# Patient Record
Sex: Male | Born: 1940 | Race: Black or African American | Hispanic: No | Marital: Married | Smoking: Former smoker
Health system: Southern US, Community
[De-identification: ages and names within clinical notes are randomized; demographics above are authoritative.]

## PROBLEM LIST (undated history)

## (undated) DIAGNOSIS — E119 Type 2 diabetes mellitus without complications: Secondary | ICD-10-CM

## (undated) DIAGNOSIS — E785 Hyperlipidemia, unspecified: Secondary | ICD-10-CM

## (undated) DIAGNOSIS — K449 Diaphragmatic hernia without obstruction or gangrene: Secondary | ICD-10-CM

## (undated) DIAGNOSIS — I2510 Atherosclerotic heart disease of native coronary artery without angina pectoris: Secondary | ICD-10-CM

## (undated) DIAGNOSIS — B20 Human immunodeficiency virus [HIV] disease: Secondary | ICD-10-CM

## (undated) DIAGNOSIS — I10 Essential (primary) hypertension: Secondary | ICD-10-CM

## (undated) DIAGNOSIS — K219 Gastro-esophageal reflux disease without esophagitis: Secondary | ICD-10-CM

## (undated) DIAGNOSIS — Z21 Asymptomatic human immunodeficiency virus [HIV] infection status: Secondary | ICD-10-CM

## (undated) DIAGNOSIS — Z960 Presence of urogenital implants: Secondary | ICD-10-CM

## (undated) DIAGNOSIS — B029 Zoster without complications: Secondary | ICD-10-CM

## (undated) DIAGNOSIS — N400 Enlarged prostate without lower urinary tract symptoms: Secondary | ICD-10-CM

## (undated) DIAGNOSIS — T884XXA Failed or difficult intubation, initial encounter: Secondary | ICD-10-CM

## (undated) HISTORY — DX: Diaphragmatic hernia without obstruction or gangrene: K44.9

## (undated) HISTORY — DX: Benign prostatic hyperplasia without lower urinary tract symptoms: N40.0

## (undated) HISTORY — DX: Atherosclerotic heart disease of native coronary artery without angina pectoris: I25.10

## (undated) HISTORY — DX: Hyperlipidemia, unspecified: E78.5

---

## 1998-04-29 ENCOUNTER — Ambulatory Visit (HOSPITAL_COMMUNITY): Admission: RE | Admit: 1998-04-29 | Discharge: 1998-04-29 | Payer: Self-pay | Admitting: Cardiology

## 1998-05-02 ENCOUNTER — Ambulatory Visit (HOSPITAL_COMMUNITY): Admission: RE | Admit: 1998-05-02 | Discharge: 1998-05-02 | Payer: Self-pay | Admitting: Cardiology

## 1998-05-02 HISTORY — PX: CARDIAC CATHETERIZATION: SHX172

## 1998-05-06 ENCOUNTER — Inpatient Hospital Stay (HOSPITAL_COMMUNITY)
Admission: RE | Admit: 1998-05-06 | Discharge: 1998-05-10 | Payer: Self-pay | Admitting: Thoracic Surgery (Cardiothoracic Vascular Surgery)

## 1998-05-06 HISTORY — PX: CORONARY ARTERY BYPASS GRAFT: SHX141

## 1998-06-17 ENCOUNTER — Encounter (HOSPITAL_COMMUNITY): Admission: RE | Admit: 1998-06-17 | Discharge: 1998-09-15 | Payer: Self-pay | Admitting: Cardiology

## 2002-10-18 HISTORY — PX: COLONOSCOPY: SHX174

## 2003-02-11 ENCOUNTER — Ambulatory Visit (HOSPITAL_COMMUNITY): Admission: RE | Admit: 2003-02-11 | Discharge: 2003-02-11 | Payer: Self-pay | Admitting: Gastroenterology

## 2006-10-04 ENCOUNTER — Encounter: Admission: RE | Admit: 2006-10-04 | Discharge: 2006-10-04 | Payer: Self-pay | Admitting: Gastroenterology

## 2007-12-06 ENCOUNTER — Ambulatory Visit: Payer: Self-pay | Admitting: Family Medicine

## 2008-01-12 ENCOUNTER — Ambulatory Visit: Payer: Self-pay | Admitting: Family Medicine

## 2009-01-30 ENCOUNTER — Ambulatory Visit: Payer: Self-pay | Admitting: Family Medicine

## 2009-02-10 ENCOUNTER — Ambulatory Visit: Payer: Self-pay | Admitting: Family Medicine

## 2009-02-17 ENCOUNTER — Encounter: Admission: RE | Admit: 2009-02-17 | Discharge: 2009-02-17 | Payer: Self-pay | Admitting: Family Medicine

## 2009-11-12 ENCOUNTER — Ambulatory Visit: Payer: Self-pay | Admitting: Family Medicine

## 2009-11-18 ENCOUNTER — Ambulatory Visit: Payer: Self-pay | Admitting: Family Medicine

## 2010-02-09 ENCOUNTER — Ambulatory Visit: Payer: Self-pay | Admitting: Family Medicine

## 2010-04-23 ENCOUNTER — Emergency Department (HOSPITAL_COMMUNITY): Admission: EM | Admit: 2010-04-23 | Discharge: 2010-04-23 | Payer: Self-pay | Admitting: Emergency Medicine

## 2010-08-11 ENCOUNTER — Ambulatory Visit: Payer: Self-pay | Admitting: Family Medicine

## 2010-08-18 ENCOUNTER — Ambulatory Visit: Payer: Self-pay | Admitting: Family Medicine

## 2010-09-21 ENCOUNTER — Ambulatory Visit: Payer: Self-pay | Admitting: Family Medicine

## 2011-01-29 ENCOUNTER — Emergency Department (HOSPITAL_COMMUNITY): Payer: MEDICARE

## 2011-01-29 ENCOUNTER — Emergency Department (HOSPITAL_COMMUNITY)
Admission: EM | Admit: 2011-01-29 | Discharge: 2011-01-29 | Disposition: A | Discharge status: Home / Self Care | Payer: MEDICARE | Attending: Emergency Medicine | Admitting: Emergency Medicine

## 2011-01-29 DIAGNOSIS — Z951 Presence of aortocoronary bypass graft: Secondary | ICD-10-CM | POA: Insufficient documentation

## 2011-01-29 DIAGNOSIS — Z79899 Other long term (current) drug therapy: Secondary | ICD-10-CM | POA: Insufficient documentation

## 2011-01-29 DIAGNOSIS — S5000XA Contusion of unspecified elbow, initial encounter: Secondary | ICD-10-CM | POA: Insufficient documentation

## 2011-01-29 DIAGNOSIS — S0003XA Contusion of scalp, initial encounter: Secondary | ICD-10-CM | POA: Insufficient documentation

## 2011-01-29 DIAGNOSIS — S0083XA Contusion of other part of head, initial encounter: Secondary | ICD-10-CM | POA: Insufficient documentation

## 2011-01-29 DIAGNOSIS — I2510 Atherosclerotic heart disease of native coronary artery without angina pectoris: Secondary | ICD-10-CM | POA: Insufficient documentation

## 2011-01-29 DIAGNOSIS — S0990XA Unspecified injury of head, initial encounter: Secondary | ICD-10-CM | POA: Insufficient documentation

## 2011-01-29 DIAGNOSIS — M25529 Pain in unspecified elbow: Secondary | ICD-10-CM | POA: Insufficient documentation

## 2011-01-29 DIAGNOSIS — I519 Heart disease, unspecified: Secondary | ICD-10-CM | POA: Insufficient documentation

## 2011-01-29 DIAGNOSIS — Y92009 Unspecified place in unspecified non-institutional (private) residence as the place of occurrence of the external cause: Secondary | ICD-10-CM | POA: Insufficient documentation

## 2011-01-29 DIAGNOSIS — Z7982 Long term (current) use of aspirin: Secondary | ICD-10-CM | POA: Insufficient documentation

## 2011-01-29 DIAGNOSIS — M545 Low back pain, unspecified: Secondary | ICD-10-CM | POA: Insufficient documentation

## 2011-01-29 DIAGNOSIS — R209 Unspecified disturbances of skin sensation: Secondary | ICD-10-CM | POA: Insufficient documentation

## 2011-01-29 DIAGNOSIS — Y93H9 Activity, other involving exterior property and land maintenance, building and construction: Secondary | ICD-10-CM | POA: Insufficient documentation

## 2011-01-29 DIAGNOSIS — R51 Headache: Secondary | ICD-10-CM | POA: Insufficient documentation

## 2011-01-29 DIAGNOSIS — W11XXXA Fall on and from ladder, initial encounter: Secondary | ICD-10-CM | POA: Insufficient documentation

## 2011-01-29 DIAGNOSIS — IMO0002 Reserved for concepts with insufficient information to code with codable children: Secondary | ICD-10-CM | POA: Insufficient documentation

## 2011-01-29 DIAGNOSIS — E7800 Pure hypercholesterolemia, unspecified: Secondary | ICD-10-CM | POA: Insufficient documentation

## 2011-01-29 DIAGNOSIS — S20229A Contusion of unspecified back wall of thorax, initial encounter: Secondary | ICD-10-CM | POA: Insufficient documentation

## 2011-03-05 NOTE — Op Note (Signed)
   NAME:  Jeremy Reeves, Jeremy Reeves                        ACCOUNT NO.:  [id number]   MEDICAL RECORD NO.:  [id number]                   PATIENT TYPE:  AMB   LOCATION:  ENDO                                 FACILITY:  [facility]   PHYSICIAN:  Anselmo Rod, M.D.               DATE OF BIRTH:  [birth year]   DATE OF PROCEDURE:  02/11/2003  DATE OF DISCHARGE:                                 OPERATIVE REPORT   PROCEDURE PERFORMED:  Screening colonoscopy.   ENDOSCOPIST:  Charna Elizabeth, M.D.   INSTRUMENT USED:  Olympus video colonoscope.   INDICATIONS FOR PROCEDURE:  The patient is a 70 year old African-American  male undergoing screening colonoscopy.  Rule out colonic polyps, masses,  etc.   PREPROCEDURE PREPARATION:  Informed consent was procured from the patient.  The patient was fasted for eight hours prior to the procedure and prepped  with a bottle of magnesium citrate and a gallon of GoLYTELY the night prior  to the procedure.   PREPROCEDURE PHYSICAL:  The patient had stable vital signs.  Neck supple.  Chest clear to auscultation.  S1 and S2 regular.  Abdomen soft with normal  bowel sounds.   DESCRIPTION OF PROCEDURE:  The patient was placed in left lateral decubitus  position and sedated with 50 mg of Demerol and 5 mg of Versed intravenously.  Once the patient was adequately sedated and maintained on low flow oxygen  and continuous cardiac monitoring, the Olympus video colonoscope was  advanced from the rectum to the cecum without difficulty.  Small nonbleeding  internal hemorrhoids were seen on retroflexion.  The rest of the colonic  mucosa up to the cecum appeared normal.  The appendicular orifice and  ileocecal valve were clearly visualized and photographed.   IMPRESSION:  Normal colonoscopy up to the cecum except for small nonbleeding  internal hemorrhoids.   RECOMMENDATIONS:  1. A high fiber diet with liberal fluid intake has been advocated.  2. Repeat colorectal cancer screening is  recommended in the next 10 years     unless the patient develops any abnormal symptoms in  the interim.                                              Anselmo Rod, M.D.   JNM/MEDQ  D:  02/11/2003  T:  02/11/2003  Job:  [id number]   cc:   Olene Craven, M.D.  [address]  [address]  [place]  [place] [address]  Fax: [phone]

## 2011-03-18 ENCOUNTER — Encounter: Payer: Self-pay | Admitting: Family Medicine

## 2011-03-18 ENCOUNTER — Ambulatory Visit (INDEPENDENT_AMBULATORY_CARE_PROVIDER_SITE_OTHER): Payer: Medicare Other | Admitting: Family Medicine

## 2011-03-18 VITALS — BP 124/82 | HR 70 | Ht 67.2 in | Wt 192.0 lb

## 2011-03-18 DIAGNOSIS — E119 Type 2 diabetes mellitus without complications: Secondary | ICD-10-CM | POA: Insufficient documentation

## 2011-03-18 DIAGNOSIS — N4821 Abscess of corpus cavernosum and penis: Secondary | ICD-10-CM

## 2011-03-18 DIAGNOSIS — E1169 Type 2 diabetes mellitus with other specified complication: Secondary | ICD-10-CM | POA: Insufficient documentation

## 2011-03-18 DIAGNOSIS — N4829 Other inflammatory disorders of penis: Secondary | ICD-10-CM

## 2011-03-18 DIAGNOSIS — N400 Enlarged prostate without lower urinary tract symptoms: Secondary | ICD-10-CM | POA: Insufficient documentation

## 2011-03-18 DIAGNOSIS — I2510 Atherosclerotic heart disease of native coronary artery without angina pectoris: Secondary | ICD-10-CM | POA: Insufficient documentation

## 2011-03-18 MED ORDER — DOXYCYCLINE HYCLATE 100 MG PO TABS: 100.0000 mg | ORAL_TABLET | Freq: Two times a day (BID) | ORAL | Status: AC

## 2011-03-18 NOTE — Patient Instructions (Signed)
Use heat on the area 2 or 3 times per day for about 20 minutes . He can sit and total hot water. Take the antibiotic. I will call you on the get the culture results back which will probably be the first week

## 2011-03-18 NOTE — Progress Notes (Signed)
  Subjective:    Patient ID: LAYTEN AIKEN, male    DOB: Mar 19, 1941, 70 y.o.   MRN: [id number]  HPI He is here for evaluation of systems swollen painful lesions, one on the penis and one on the scrotum. One on the penis has started to drain.   Review of Systems     Objective:   Physical Exam A 1/2 cm raised lesion with some drainage is noted at the base of the penis on the left. He also has a right scrotal lesion of approximately 2 cm it is indurated but is not draining.       Assessment & Plan:  Abscess of penis and scrotum A culture was taken. I will place him on doxycycline and reevaluate after the culture results are in

## 2011-03-18 NOTE — Progress Notes (Signed)
Addended by: Ronnald Nian on: 03/18/2011 03:32 PM   Modules accepted: Orders

## 2011-03-21 LAB — WOUND CULTURE: Gram Stain: NONE SEEN

## 2011-03-29 HISTORY — PX: TRANSTHORACIC ECHOCARDIOGRAM: SHX275

## 2011-04-06 ENCOUNTER — Other Ambulatory Visit: Payer: Self-pay

## 2011-04-06 MED ORDER — TAMSULOSIN HCL 0.4 MG PO CAPS: 0.4000 mg | ORAL_CAPSULE | Freq: Every day | ORAL | Status: DC

## 2011-04-23 ENCOUNTER — Ambulatory Visit (INDEPENDENT_AMBULATORY_CARE_PROVIDER_SITE_OTHER): Payer: Medicare Other | Admitting: Family Medicine

## 2011-04-23 ENCOUNTER — Encounter: Payer: Self-pay | Admitting: Family Medicine

## 2011-04-23 ENCOUNTER — Ambulatory Visit: Payer: Medicare Other | Admitting: Family Medicine

## 2011-04-23 VITALS — BP 120/80 | HR 72 | Wt 192.0 lb

## 2011-04-23 DIAGNOSIS — H8110 Benign paroxysmal vertigo, unspecified ear: Secondary | ICD-10-CM

## 2011-04-23 DIAGNOSIS — W57XXXA Bitten or stung by nonvenomous insect and other nonvenomous arthropods, initial encounter: Secondary | ICD-10-CM

## 2011-04-23 DIAGNOSIS — T148XXA Other injury of unspecified body region, initial encounter: Secondary | ICD-10-CM

## 2011-04-23 DIAGNOSIS — M549 Dorsalgia, unspecified: Secondary | ICD-10-CM

## 2011-04-23 NOTE — Patient Instructions (Signed)
Start some exercises for you back. If you have trouble with fever, headache or rash, come on back .

## 2011-04-23 NOTE — Progress Notes (Signed)
  Subjective:    Patient ID: Jeremy Reeves, male    DOB: [birth year], 70 y.o.   MRN: [id number]  HPI He is here for evaluation of multiple concerns. He did get by a check on the abdomen and is concerned about possible infection. He also noted an onset of dizziness several days ago when he got up to go to the bathroom. The symptoms went away very quickly. He also complains of a one-month history of right-sided low back pain however the pain goes away relatively quickly after some minimal mottled stretching.  Review of Systems     Objective:   Physical Exam Alert and in no distress. 1 cm slightly erythematous healing lesion is noted on the left midabdominal area. No rashes noted. Back exam shows good motion with no tenderness to palpation.       Assessment & Plan:  Tick bite, doubt any infection. Vertigo probably secondary to position. Mechanical low back pain. I reassured him that the tick bite was nothing to worry about but did discuss fever, headache, rash with him. Recommend he get up more slowly from a lying position. Instructed him on stretching exercises for his back.

## 2011-05-07 ENCOUNTER — Ambulatory Visit (INDEPENDENT_AMBULATORY_CARE_PROVIDER_SITE_OTHER): Payer: Medicare Other | Admitting: Medical

## 2011-05-07 ENCOUNTER — Encounter: Payer: Self-pay | Admitting: Medical

## 2011-05-07 VITALS — BP 130/80 | HR 72 | Wt 195.0 lb

## 2011-05-07 DIAGNOSIS — L0231 Cutaneous abscess of buttock: Secondary | ICD-10-CM

## 2011-05-07 DIAGNOSIS — L03317 Cellulitis of buttock: Secondary | ICD-10-CM

## 2011-05-07 MED ORDER — HYDROCODONE-ACETAMINOPHEN 5-500 MG PO TABS: 1.0000 | ORAL_TABLET | ORAL | Status: DC | PRN

## 2011-05-07 MED ORDER — SULFAMETHOXAZOLE-TRIMETHOPRIM 800-160 MG PO TABS: 1.0000 | ORAL_TABLET | Freq: Two times a day (BID) | ORAL | Status: DC

## 2011-05-07 NOTE — Progress Notes (Signed)
Subjective:    Jeremy Reeves is a 70 y.o. male who presents for evaluation of a possible skin infection located left buttock/upper thigh posteriorly. Symptoms include erythema located in the same area, pain, hardness and ..had a little drainage a few days ago with pus. Patient denies chills and fever. Precipitating event: none known. Treatment to date has included warm compresses with minimal relief.  The following portions of the patient's history were reviewed and updated as appropriate: allergies, current medications, past family history, past medical history, past social history, past surgical history and problem list.  Past Medical History  Diagnosis Date  . ASHD (arteriosclerotic heart disease)   . Diabetes mellitus   . Hyperlipidemia   . BPH (benign prostatic hyperplasia)   . Hiatal hernia     Review of Systems A comprehensive review of systems was negative.     Objective:    Filed Vitals:   05/07/11 1553  BP: 130/80  Pulse: 72    General appearance: alert, no distress, WD/WN, black male  Skin: left upper thigh/buttock region with indurated, tender, warm, and erythematous 6cm area.  No fluctuance at this time.  There is a central pore as if there has been some drainage.      Assessment:   Encounter Diagnosis  Name Primary?  . Cellulitis of buttock, left Yes     Plan:   Discussed diagnosis and causes of cellulitis, usual course of this type of infection, and if worse over the weekend go to ED in the event he needs I&D.  Bactrim prescribed. Agricultural engineer distributed. Pain medication: Lortab. Follow up in 3 days for wound check.

## 2011-05-07 NOTE — Patient Instructions (Signed)
Cellulitis Cellulitis is an infection of the skin and the tissue beneath it. The area is typically red and tender. It is caused by germs (bacteria) (usually staph or strep) that enter the body through cuts or sores. Cellulitis most commonly occurs in the arms or lower legs.  HOME CARE INSTRUCTIONS  If you are given a prescription for medications which kill germs (antibiotics), take as directed until finished.   If the infection is on the arm or leg, keep the limb elevated as able.   Use a warm cloth several times per day to relieve pain and encourage healing.   See your caregiver for recheck of the infected site in 3 or sooner if problems arise.   Only take over-the-counter or prescription medicines for pain, discomfort, or fever as directed by your caregiver.  SEEK MEDICAL CARE IF:  An oral temperature above 101 develops, not controlled by medication.   The area of redness (inflammation) is spreading, there are red streaks coming from the infected site, or if a part of the infection begins to turn dark in color.   The joint or bone underneath the infected skin becomes painful after the skin has healed.   The infection returns in the same or another area after it seems to have gone away.   A boil or bump swells up. This may be an abscess.   New, unexplained problems such as pain or fever develop.  SEEK IMMEDIATE MEDICAL CARE IF:  You or your child feels drowsy or lethargic.   There is vomiting, diarrhea, or lasting discomfort or feeling ill (malaise) with muscle aches and pains.  MAKE SURE YOU:   Understand these instructions.   Will watch your condition.   Will get help right away if you are not doing well or get worse.  Document Released: 07/14/2005 Document Re-Released: 08/01/2009 [facility] Patient Information 2011 [place], [place].

## 2011-05-10 ENCOUNTER — Encounter: Payer: Self-pay | Admitting: Medical

## 2011-05-10 ENCOUNTER — Encounter: Payer: Self-pay | Admitting: Family Medicine

## 2011-05-10 ENCOUNTER — Ambulatory Visit (INDEPENDENT_AMBULATORY_CARE_PROVIDER_SITE_OTHER): Payer: Medicare Other | Admitting: Medical

## 2011-05-10 VITALS — BP 112/82 | HR 76 | Temp 98.2°F | Ht 66.0 in | Wt 194.0 lb

## 2011-05-10 DIAGNOSIS — L03119 Cellulitis of unspecified part of limb: Secondary | ICD-10-CM

## 2011-05-10 DIAGNOSIS — L02419 Cutaneous abscess of limb, unspecified: Secondary | ICD-10-CM

## 2011-05-10 MED ORDER — HYDROCODONE-ACETAMINOPHEN 5-500 MG PO TABS: 1.0000 | ORAL_TABLET | ORAL | Status: AC | PRN

## 2011-05-10 NOTE — Progress Notes (Signed)
Subjective:    Jeremy Reeves is a 70 y.o. male who presents for recheck on cellulitis.   He came in Friday 3 days ago for same.  At that time had no fluctuance but 6cm of induration.  I started him on Bactrim.  Over the weekend the area had some slight drainage.  He has been using warm compresses. The pain is a little better.  No dramatic improvement though.  Patient denies chills and fever. Precipitating event: none known. Treatment to date has included warm compresses with minimal relief.  The following portions of the patient's history were reviewed and updated as appropriate: allergies, current medications, past family history, past medical history, past social history, past surgical history and problem list.  Past Medical History  Diagnosis Date  . ASHD (arteriosclerotic heart disease)   . Diabetes mellitus   . Hyperlipidemia   . BPH (benign prostatic hyperplasia)   . Hiatal hernia     Review of Systems Gen: no fever, chills, sweats GI: no nausea, vomiting Neuro: no numbness, tingling    Objective:    Filed Vitals:   05/10/11 0821  BP: 112/82  Pulse: 76  Temp: 98.2 F (36.8 C)    General appearance: alert, no distress, WD/WN, black male  Skin: left upper thigh/buttock region with indurated, tender, warm, and erythematous 6cm area.  Central fluctuance at this time with small amount of purulent drainage.  There is a central pore.      Assessment:     Plan:    Discussed risks/benefits of I&D.  He agrees to proceed.  Cleaned and prepped in usual sterile fashion.  Used 3cc of 1% lidocaine with epi for local anesthesia.   Incised with #11 blade.  1.5 cc of purulent drainage expressed.  Explored wound and broke loculations.  Irrigated with high pressure saline. 1cm of 1/4" packing placed.  Covered with sterile dressing.  Culture sent.   C/t Bactrim.  Recheck in 3 days for packing removal and recheck.

## 2011-05-12 ENCOUNTER — Ambulatory Visit (INDEPENDENT_AMBULATORY_CARE_PROVIDER_SITE_OTHER): Payer: Medicare Other | Admitting: Medical

## 2011-05-12 ENCOUNTER — Encounter: Payer: Self-pay | Admitting: Medical

## 2011-05-12 VITALS — BP 112/80 | HR 72 | Temp 98.1°F | Ht 66.0 in | Wt 190.0 lb

## 2011-05-12 DIAGNOSIS — L03119 Cellulitis of unspecified part of limb: Secondary | ICD-10-CM

## 2011-05-12 DIAGNOSIS — L02419 Cutaneous abscess of limb, unspecified: Secondary | ICD-10-CM

## 2011-05-12 MED ORDER — AMOXICILLIN-POT CLAVULANATE 875-125 MG PO TABS: 1.0000 | ORAL_TABLET | Freq: Two times a day (BID) | ORAL | Status: AC

## 2011-05-12 NOTE — Progress Notes (Signed)
Subjective:    Jeremy Reeves is a 70 y.o. male who presents for recheck on cellulitis.   I saw him Monday at which time we did I&D with small amount of purulent discharge, and culture was sent.  He has been taking Bactrim with mild improvement.  He still notes some tenderness and hardness of the right leg infection/cellulitis.   No other new c/o, still using warm compresses.   The following portions of the patient's history were reviewed and updated as appropriate: allergies, current medications, past family history, past medical history, past social history, past surgical history and problem list.  Past Medical History  Diagnosis Date  . ASHD (arteriosclerotic heart disease)   . Diabetes mellitus   . Hyperlipidemia   . BPH (benign prostatic hyperplasia)   . Hiatal hernia     Review of Systems Gen: no fever, chills, sweats GI: no nausea, vomiting Neuro: no numbness, tingling    Objective:    Filed Vitals:   05/12/11 1341  BP: 112/80  Pulse: 72  Temp: 98.1 F (36.7 C)    General appearance: alert, no distress, WD/WN, black male  Skin: left upper thigh/buttock region with indurated, tender, warm, and erythematous 6cm area.  Removed packing.  Central fluctuance at this time with small amount of purulent drainage.        Assessment:   Encounter Diagnosis  Name Primary?  . Cellulitis of leg Yes     Plan:   I reviewed the recent culture which showed lots of staphylococcus aureus, but no MRSA.   I advised him to stop Bactrim as it doesn't appear to be resolving the infection, and switch to Augmentin.  Recheck in 3 days, sooner prn.

## 2011-05-13 ENCOUNTER — Telehealth: Payer: Self-pay | Admitting: *Deleted

## 2011-05-13 LAB — WOUND CULTURE

## 2011-05-13 NOTE — Telephone Encounter (Addendum)
Message copied by Dorthula Perfect on Thu May 13, 2011 10:57 AM ------      Message from: Aleen Campi, DAVID S      Created: Thu May 13, 2011  8:26 AM       Pls call and advise that the final result came back and it was different that what I saw yesterday.  I apologize for the confusion, but the final result shows that the Septra should work.  Thus, have him stop the Augmentin we discussed yesterday since it won't work given the culture findings today.   (just have him hold onto the Augmentin for future use- don't throw it out).              He can either continue the Septra and continue epsom salt soaks and warm compresses, or we can switch to Doxycycline and see if it heals it up faster.  Either way, if not improved by Monday, we should switch to Doxycycline at that time.  Let me know.   Pt notified about culture results.  Pt advised to stop Augmentin and to continue Septra and to continue epsom salt soaks and warm compresses.  Pt verbalized understanding.  Pt scheduled to come in Monday to see Dr. Susann Givens.  CM, LPN

## 2011-05-17 ENCOUNTER — Encounter: Payer: Self-pay | Admitting: Family Medicine

## 2011-05-17 ENCOUNTER — Ambulatory Visit (INDEPENDENT_AMBULATORY_CARE_PROVIDER_SITE_OTHER): Payer: Medicare Other | Admitting: Family Medicine

## 2011-05-17 VITALS — BP 116/68 | HR 72 | Wt 192.0 lb

## 2011-05-17 DIAGNOSIS — L03818 Cellulitis of other sites: Secondary | ICD-10-CM

## 2011-05-17 DIAGNOSIS — L02818 Cutaneous abscess of other sites: Secondary | ICD-10-CM

## 2011-05-17 MED ORDER — SULFAMETHOXAZOLE-TMP DS 800-160 MG PO TABS: 1.0000 | ORAL_TABLET | Freq: Two times a day (BID) | ORAL | Status: AC

## 2011-05-17 NOTE — Patient Instructions (Signed)
Take another week of the antibiotic and continue to use heat on it. If there is any questions call me

## 2011-05-17 NOTE — Progress Notes (Signed)
  Subjective:    Patient ID: Jeremy Reeves, male    DOB: [birth year], 70 y.o.   MRN: [id number]  HPI He is here for a recheck on his left thigh abscess. He presently is on Septra. He has noted a definite decrease in the induration.   Review of Systems     Objective:   Physical Exam Exam of the left thigh does show slight drainage and induration of approximately 3 cm.       Assessment & Plan:  Abscess with cellulitis Continue on Septra. Another weeks worth was given. He's to continued use heat and call if any further trouble.

## 2011-09-02 ENCOUNTER — Other Ambulatory Visit: Payer: Self-pay | Admitting: Family Medicine

## 2011-09-02 NOTE — Telephone Encounter (Signed)
Is this okay to refill? 

## 2011-09-08 ENCOUNTER — Encounter: Payer: Self-pay | Admitting: Family Medicine

## 2011-09-08 ENCOUNTER — Ambulatory Visit (INDEPENDENT_AMBULATORY_CARE_PROVIDER_SITE_OTHER): Payer: Medicare Other | Admitting: Family Medicine

## 2011-09-08 VITALS — BP 128/78 | HR 68 | Wt 189.0 lb

## 2011-09-08 DIAGNOSIS — Z23 Encounter for immunization: Secondary | ICD-10-CM

## 2011-09-08 DIAGNOSIS — K922 Gastrointestinal hemorrhage, unspecified: Secondary | ICD-10-CM

## 2011-09-08 DIAGNOSIS — K921 Melena: Secondary | ICD-10-CM

## 2011-09-08 LAB — COMPREHENSIVE METABOLIC PANEL
Albumin: 4.6 g/dL (ref 3.5–5.2)
CO2: 29 mEq/L (ref 19–32)
Chloride: 104 mEq/L (ref 96–112)
Glucose, Bld: 82 mg/dL (ref 70–99)
Potassium: 4 mEq/L (ref 3.5–5.3)
Sodium: 141 mEq/L (ref 135–145)
Total Protein: 6.8 g/dL (ref 6.0–8.3)

## 2011-09-08 LAB — CBC WITH DIFFERENTIAL/PLATELET
Basophils Absolute: 0 10*3/uL (ref 0.0–0.1)
Eosinophils Absolute: 0.2 10*3/uL (ref 0.0–0.7)
Eosinophils Relative: 3 % (ref 0–5)
MCH: 26.4 pg (ref 26.0–34.0)
MCV: 79.8 fL (ref 78.0–100.0)
Neutrophils Relative %: 51 % (ref 43–77)
Platelets: 113 10*3/uL — ABNORMAL LOW (ref 150–400)
RDW: 15.2 % (ref 11.5–15.5)
WBC: 7.5 10*3/uL (ref 4.0–10.5)

## 2011-09-08 LAB — POCT HEMOGLOBIN: Hemoglobin: 14.8

## 2011-09-08 NOTE — Progress Notes (Signed)
  Subjective:    Patient ID: Jeremy Reeves, male    DOB: 1941/04/20, 70 y.o.   MRN: [id number]  HPI He has had intermittent abdominal pain for last 2 weeks. When he would eat, the symptoms would improve. For the last 2 days she is seeing black, tarry stools. No nausea, vomiting or diarrhea. He states that he had a similar episode one month ago however the symptoms went away. He does take daily aspirin   Review of Systems     Objective:   Physical Exam Alert and in no distress. Abdominal exam shows normal bowel sounds without masses or tenderness. Rectal exam shows black stool that was guaiac-positive. Fingerstick hemoglobin is 14.8. Postural signs are stable.       Assessment & Plan:  GI bleed.  Case was discussed with Dr. Marina Goodell. We will do routine blood screening. I gave them a sample of Dexilant. Also discussed symptoms to be concerned about and gave him the phone number of [facility] to call if symptoms worsen before he is seen next week. He was also given flu shot. Benefits and possible side effects were discussed with him . As he was checking out he then mentioned that he had seen Dr. Randa Evens in the past. An appointment will be set up with Dr. Randa Evens. In the room with me, he stated he had not seen a GI doctor in the past.

## 2011-09-08 NOTE — Patient Instructions (Signed)
Take the Dexilant daily. Stop the aspirin for right now. If you need pain relief for any reason just use Tylenol. If you have more trouble with weakness, nausea, vomiting or increase in the black tarry stools, I want you to call [phone].. he will get a call from  [facility] concerning an appointment for next week

## 2011-09-13 ENCOUNTER — Telehealth: Payer: Self-pay | Admitting: Family Medicine

## 2011-09-13 NOTE — Telephone Encounter (Signed)
Since patient is out of dexliant, i have given a 5 day to get him to his GI appt on Friday Nov 30 and he will discuss this Dr. Randa Evens

## 2011-09-13 NOTE — Telephone Encounter (Signed)
Have him followup with his gastroenterologist concerning this. Make sure he is scheduled to see Dr. Randa Evens.

## 2011-11-09 DIAGNOSIS — D128 Benign neoplasm of rectum: Secondary | ICD-10-CM | POA: Diagnosis not present

## 2011-11-09 DIAGNOSIS — K219 Gastro-esophageal reflux disease without esophagitis: Secondary | ICD-10-CM | POA: Diagnosis not present

## 2011-11-09 DIAGNOSIS — K921 Melena: Secondary | ICD-10-CM | POA: Diagnosis not present

## 2011-12-01 DIAGNOSIS — I10 Essential (primary) hypertension: Secondary | ICD-10-CM | POA: Diagnosis not present

## 2011-12-01 DIAGNOSIS — E7800 Pure hypercholesterolemia, unspecified: Secondary | ICD-10-CM | POA: Diagnosis not present

## 2011-12-01 DIAGNOSIS — I2510 Atherosclerotic heart disease of native coronary artery without angina pectoris: Secondary | ICD-10-CM | POA: Diagnosis not present

## 2011-12-01 DIAGNOSIS — E119 Type 2 diabetes mellitus without complications: Secondary | ICD-10-CM | POA: Diagnosis not present

## 2011-12-01 HISTORY — PX: CARDIOVASCULAR STRESS TEST: SHX262

## 2011-12-02 ENCOUNTER — Other Ambulatory Visit: Payer: Self-pay | Admitting: Family Medicine

## 2011-12-02 NOTE — Telephone Encounter (Signed)
IS THIS OK 

## 2011-12-07 DIAGNOSIS — I2510 Atherosclerotic heart disease of native coronary artery without angina pectoris: Secondary | ICD-10-CM | POA: Diagnosis not present

## 2011-12-07 DIAGNOSIS — I10 Essential (primary) hypertension: Secondary | ICD-10-CM | POA: Diagnosis not present

## 2011-12-07 DIAGNOSIS — Z79899 Other long term (current) drug therapy: Secondary | ICD-10-CM | POA: Diagnosis not present

## 2011-12-07 DIAGNOSIS — E782 Mixed hyperlipidemia: Secondary | ICD-10-CM | POA: Diagnosis not present

## 2012-02-11 ENCOUNTER — Encounter: Payer: Self-pay | Admitting: Cardiovascular Disease

## 2012-02-29 ENCOUNTER — Other Ambulatory Visit: Payer: Self-pay | Admitting: Family Medicine

## 2012-05-18 ENCOUNTER — Other Ambulatory Visit: Payer: Self-pay | Admitting: Family Medicine

## 2012-06-12 DIAGNOSIS — E782 Mixed hyperlipidemia: Secondary | ICD-10-CM | POA: Diagnosis not present

## 2012-06-12 DIAGNOSIS — I10 Essential (primary) hypertension: Secondary | ICD-10-CM | POA: Diagnosis not present

## 2012-06-12 DIAGNOSIS — I2510 Atherosclerotic heart disease of native coronary artery without angina pectoris: Secondary | ICD-10-CM | POA: Diagnosis not present

## 2012-06-28 ENCOUNTER — Ambulatory Visit (INDEPENDENT_AMBULATORY_CARE_PROVIDER_SITE_OTHER): Payer: MEDICARE | Admitting: Medical

## 2012-06-28 ENCOUNTER — Encounter: Payer: Self-pay | Admitting: Medical

## 2012-06-28 VITALS — BP 120/80 | HR 60 | Temp 97.8°F | Resp 14 | Wt 193.0 lb

## 2012-06-28 DIAGNOSIS — T783XXA Angioneurotic edema, initial encounter: Secondary | ICD-10-CM | POA: Diagnosis not present

## 2012-06-28 NOTE — Progress Notes (Signed)
Subjective: Here for c/o face swelling.  Yesterday was fine, but in the evening started having some facial swelling around the mouth and lips.  Still persisted by the morning but since this morning it has improved a litle. Denies fever, chills, nausea, vomiting, teeth pain, does not feel ill.  No trauma to face.  No warmth or redness.  No other aggravating or relieving factors. No wheezing, no SOB, no tongue swelling, just lips and cheeks.  No other c/o.  Past Medical History  Diagnosis Date  . ASHD (arteriosclerotic heart disease)   . Diabetes mellitus   . Hyperlipidemia   . BPH (benign prostatic hyperplasia)   . Hiatal hernia    ROS as noted in HPI  Objective: Gen: wd, wn, nad Heent: mild swelling of lips and perioral region, but no tongue swelling, no generalized facial swelling, no erythema or warmth.  TMs pearly, conjunctiva normal, nares patent, pharynx normal Oral: no lesions, MMM Skin: unremarkable Neck: supple, nontender,r no mass or thyromegaly Lungs: CTA Heart: RRR, no murmurs, normal s1, s2 Pulses normal Ext: no extremity edema   Assessment: Encounter Diagnosis  Name Primary?  . Angioedema Yes    Plan: Most likely due to Lisinopril.  He has been on lisinopril since 2011.   Advised he stop this.   Can use some OTC Benadryl.  Call if not resolved in 24 hours.   Go to ED if much worse, tongue swelling, dyspnea, etc.  Return soon for physical.

## 2012-06-30 ENCOUNTER — Other Ambulatory Visit: Payer: Self-pay | Admitting: Internal Medicine

## 2012-06-30 MED ORDER — GLUCOSE BLOOD VI STRP: ORAL_STRIP | Status: DC

## 2012-06-30 MED ORDER — LANCETS MISC: Status: DC

## 2012-06-30 NOTE — Telephone Encounter (Signed)
Test strips and lancets.

## 2012-06-30 NOTE — Telephone Encounter (Signed)
Pt needs a refill on the contour blood glucose test strips and lancets to [facility] on [address]

## 2012-07-03 ENCOUNTER — Telehealth: Payer: Self-pay | Admitting: Family Medicine

## 2012-07-03 NOTE — Telephone Encounter (Signed)
Wife called and said RX not at pharm.  I called [facility] and gave them rx again for strips and lancets.

## 2012-08-03 ENCOUNTER — Encounter: Payer: Self-pay | Admitting: Internal Medicine

## 2012-08-15 ENCOUNTER — Encounter: Payer: Self-pay | Admitting: Family Medicine

## 2012-08-15 ENCOUNTER — Ambulatory Visit (INDEPENDENT_AMBULATORY_CARE_PROVIDER_SITE_OTHER): Payer: MEDICARE | Admitting: Family Medicine

## 2012-08-15 VITALS — BP 130/90 | HR 72 | Ht 66.0 in | Wt 198.0 lb

## 2012-08-15 DIAGNOSIS — E66811 Obesity, class 1: Secondary | ICD-10-CM

## 2012-08-15 DIAGNOSIS — E785 Hyperlipidemia, unspecified: Secondary | ICD-10-CM

## 2012-08-15 DIAGNOSIS — Z23 Encounter for immunization: Secondary | ICD-10-CM

## 2012-08-15 DIAGNOSIS — E1159 Type 2 diabetes mellitus with other circulatory complications: Secondary | ICD-10-CM

## 2012-08-15 DIAGNOSIS — E669 Obesity, unspecified: Secondary | ICD-10-CM | POA: Diagnosis not present

## 2012-08-15 DIAGNOSIS — Z125 Encounter for screening for malignant neoplasm of prostate: Secondary | ICD-10-CM | POA: Diagnosis not present

## 2012-08-15 DIAGNOSIS — N400 Enlarged prostate without lower urinary tract symptoms: Secondary | ICD-10-CM

## 2012-08-15 DIAGNOSIS — Z0000 Encounter for general adult medical examination without abnormal findings: Secondary | ICD-10-CM

## 2012-08-15 DIAGNOSIS — E1169 Type 2 diabetes mellitus with other specified complication: Secondary | ICD-10-CM

## 2012-08-15 DIAGNOSIS — I2510 Atherosclerotic heart disease of native coronary artery without angina pectoris: Secondary | ICD-10-CM

## 2012-08-15 DIAGNOSIS — I10 Essential (primary) hypertension: Secondary | ICD-10-CM | POA: Diagnosis not present

## 2012-08-15 DIAGNOSIS — T783XXA Angioneurotic edema, initial encounter: Secondary | ICD-10-CM

## 2012-08-15 DIAGNOSIS — Z79899 Other long term (current) drug therapy: Secondary | ICD-10-CM

## 2012-08-15 DIAGNOSIS — E119 Type 2 diabetes mellitus without complications: Secondary | ICD-10-CM

## 2012-08-15 LAB — POCT URINALYSIS DIPSTICK
Bilirubin, UA: NEGATIVE
Blood, UA: NEGATIVE
Ketones, UA: NEGATIVE
Leukocytes, UA: NEGATIVE
Nitrite, UA: NEGATIVE
Protein, UA: NEGATIVE
pH, UA: 5

## 2012-08-15 MED ORDER — INFLUENZA VIRUS VACC SPLIT PF IM SUSP: 0.5000 mL | Freq: Once | INTRAMUSCULAR | Status: DC

## 2012-08-15 NOTE — Progress Notes (Signed)
Subjective:    Patient ID: Jeremy Reeves, male    DOB: 09-25-1941, 71 y.o.   MRN: [id number]  HPI He is here for complete exam. His one concern is intermittent swelling of the lips and legs. He was taken off lisinopril however the swelling has continued. He has tried stopping various sweeteners with no benefit. At this point he is unable to determine exactly what causing the swelling. He does see the cardiologist periodically. He continues on medications listed in the chart. Apparently his blood sugars run in the low 100s. He continues on Flomax but states he cannot really tell whether his help with his bladder symptoms. He states that he has been through diabetes education but does not remember much of this.   Review of Systems  Constitutional: Negative.   HENT: Negative.   Eyes: Negative.   Respiratory: Negative.   Cardiovascular: Negative.   Genitourinary: Negative.   Musculoskeletal: Negative.   Neurological: Negative.   Hematological: Negative.   Psychiatric/Behavioral: Negative.        Objective:   Physical Exam BP 130/90  Pulse 72  Ht 5\' 6"  (1.676 m)  Wt 198 lb (89.812 kg)  BMI 31.96 kg/m2  General Appearance:    Alert, cooperative, no distress, appears stated age  Head:    Normocephalic, without obvious abnormality, atraumatic  Eyes:    PERRL, conjunctiva/corneas clear, EOM's intact, fundi    benign  Ears:    Normal TM's and external ear canals  Nose:   Nares normal, mucosa normal, no drainage or sinus   tenderness  Throat:   Lips, mucosa, and tongue normal; teeth and gums normal  Neck:   Supple, no lymphadenopathy;  thyroid:  no   enlargement/tenderness/nodules; no carotid   bruit or JVD  Back:    Spine nontender, no curvature, ROM normal, no CVA     tenderness  Lungs:     Clear to auscultation bilaterally without wheezes, rales or     ronchi; respirations unlabored  Chest Wall:    No tenderness or deformity   Heart:    Regular rate and rhythm, S1 and S2 normal,  no murmur, rub   or gallop  Breast Exam:    No chest wall tenderness, masses or gynecomastia  Abdomen:     Soft, non-tender, nondistended, normoactive bowel sounds,    no masses, no hepatosplenomegaly  Genitalia:    Normal male external genitalia without lesions.  Testicles without masses.  No inguinal hernias.  Rectal:    deferred.  Extremities:   No clubbing, cyanosis or edema  Pulses:   2+ and symmetric all extremities  Skin:   Skin color, texture, turgor normal, no rashes or lesions  Lymph nodes:   Cervical, supraclavicular, and axillary nodes normal  Neurologic:   CNII-XII intact, normal strength, sensation and gait; reflexes 2+ and symmetric throughout          Psych:   Normal mood, affect, hygiene and grooming.    Globin A1c is 6.8       Assessment & Plan:   1. Need for prophylactic vaccination and inoculation against influenza  influenza  inactive virus vaccine (FLUZONE/FLUARIX) injection 0.5 mL  2. Routine general medical examination at a health care facility  POCT Urinalysis Dipstick, Visual acuity screening, CBC with Differential, Comprehensive metabolic panel  3. Angioedema    4. Hyperlipidemia LDL goal <70  Lipid panel  5. BPH (benign prostatic hyperplasia)    6. ASHD (arteriosclerotic heart disease)    7.  Diabetes mellitus    8. Obesity (BMI 30.0-34.9)  CBC with Differential, Comprehensive metabolic panel  9. Special screening for malignant neoplasm of prostate  PSA, Medicare  10. Encounter for long-term (current) use of other medications    11. Hypertension associated with diabetes     I discussed diet and exercise with him. Some increase him to make dietary changes especially in regard to carbohydrates. Discussed advanced directive with him and apparently he is very set this up. He is to keep track of when he has the swelling and see if he can relate this to any particular activities, foods or other chemicals.

## 2012-08-15 NOTE — Patient Instructions (Signed)
Stop the Flomax and see if it makes a difference when you pee.

## 2012-08-16 LAB — COMPREHENSIVE METABOLIC PANEL
Albumin: 4.5 g/dL (ref 3.5–5.2)
Alkaline Phosphatase: 49 U/L (ref 39–117)
BUN: 15 mg/dL (ref 6–23)
Glucose, Bld: 102 mg/dL — ABNORMAL HIGH (ref 70–99)
Potassium: 4 mEq/L (ref 3.5–5.3)
Total Bilirubin: 0.8 mg/dL (ref 0.3–1.2)

## 2012-08-16 LAB — CBC WITH DIFFERENTIAL/PLATELET
Eosinophils Absolute: 0.1 10*3/uL (ref 0.0–0.7)
Eosinophils Relative: 3 % (ref 0–5)
Lymphocytes Relative: 32 % (ref 12–46)
Lymphs Abs: 1.6 10*3/uL (ref 0.7–4.0)
Monocytes Relative: 15 % — ABNORMAL HIGH (ref 3–12)

## 2012-08-16 LAB — LIPID PANEL
HDL: 29 mg/dL — ABNORMAL LOW (ref >39)
LDL Cholesterol: 54 mg/dL (ref 0–99)

## 2012-10-23 DIAGNOSIS — Z978 Presence of other specified devices: Secondary | ICD-10-CM

## 2012-10-23 HISTORY — DX: Presence of other specified devices: Z97.8

## 2012-12-19 ENCOUNTER — Ambulatory Visit: Payer: Commercial Managed Care - PPO | Admitting: Family Medicine

## 2012-12-27 DIAGNOSIS — I10 Essential (primary) hypertension: Secondary | ICD-10-CM | POA: Diagnosis not present

## 2012-12-27 DIAGNOSIS — E782 Mixed hyperlipidemia: Secondary | ICD-10-CM | POA: Diagnosis not present

## 2013-01-02 ENCOUNTER — Ambulatory Visit: Payer: Self-pay | Admitting: Family Medicine

## 2013-01-11 ENCOUNTER — Ambulatory Visit: Payer: Self-pay | Admitting: Family Medicine

## 2013-01-25 ENCOUNTER — Ambulatory Visit (INDEPENDENT_AMBULATORY_CARE_PROVIDER_SITE_OTHER): Payer: MEDICARE | Admitting: Family Medicine

## 2013-01-25 ENCOUNTER — Encounter: Payer: Self-pay | Admitting: Family Medicine

## 2013-01-25 VITALS — BP 130/80 | HR 70 | Wt 192.0 lb

## 2013-01-25 DIAGNOSIS — E785 Hyperlipidemia, unspecified: Secondary | ICD-10-CM

## 2013-01-25 DIAGNOSIS — I10 Essential (primary) hypertension: Secondary | ICD-10-CM

## 2013-01-25 DIAGNOSIS — E119 Type 2 diabetes mellitus without complications: Secondary | ICD-10-CM

## 2013-01-25 DIAGNOSIS — I152 Hypertension secondary to endocrine disorders: Secondary | ICD-10-CM | POA: Insufficient documentation

## 2013-01-25 DIAGNOSIS — E1169 Type 2 diabetes mellitus with other specified complication: Secondary | ICD-10-CM

## 2013-01-25 DIAGNOSIS — E1159 Type 2 diabetes mellitus with other circulatory complications: Secondary | ICD-10-CM | POA: Insufficient documentation

## 2013-01-25 LAB — POCT GLYCOSYLATED HEMOGLOBIN (HGB A1C): Hemoglobin A1C: 6.5

## 2013-01-25 NOTE — Patient Instructions (Signed)
Start walking at least 5 days a week for half an hour. Check your blood sugars either before a meal or 2 hours after a meal. If I can get to do this several times a week I'll be happy

## 2013-01-25 NOTE — Progress Notes (Signed)
  Subjective:    Jeremy Reeves is a 72 y.o. male who presents for follow-up of Type 2 diabetes mellitus.    Home blood sugar records: He checks his sugars intermittently and at different times every 2-3 days  Current symptoms/problems include none and have   been stable. Daily foot checks, foot concerns:  Last eye exam:     Medication compliance: Current diet: in general, a "healthy" diet   Current exercise: walking Known diabetic complications: none Cardiovascular risk factors: advanced age (older than 33 for men, 73 for women), diabetes mellitus, dyslipidemia, hypertension, male gender and obesity (BMI >= 30 kg/m2)   The following portions of the patient's history were reviewed and updated as appropriate: past surgical history and problem list.  ROS as in subjective above    Objective:    BP 130/80  Pulse 70  Wt 192 lb (87.091 kg)  BMI 31 kg/m2  Filed Vitals:   01/25/13 0953  BP: 130/80  Pulse: 70     Lab Review Lab Results  Component Value Date   HGBA1C 6.5 01/25/2013   Lab Results  Component Value Date   CHOL 93 08/15/2012   HDL 29* 08/15/2012   LDLCALC 54 08/15/2012   TRIG 50 08/15/2012   CHOLHDL 3.2 08/15/2012   No results found for this basenameConcepcion Elk     Chemistry      Component Value Date/Time   NA 139 08/15/2012 0921   K 4.0 08/15/2012 0921   CL 102 08/15/2012 0921   CO2 29 08/15/2012 0921   BUN 15 08/15/2012 0921   CREATININE 1.18 08/15/2012 0921      Component Value Date/Time   CALCIUM 9.3 08/15/2012 0921   ALKPHOS 49 08/15/2012 0921   AST 41* 08/15/2012 0921   ALT 47 08/15/2012 0921   BILITOT 0.8 08/15/2012 0921        Chemistry      Component Value Date/Time   NA 139 08/15/2012 0921   K 4.0 08/15/2012 0921   CL 102 08/15/2012 0921   CO2 29 08/15/2012 0921   BUN 15 08/15/2012 0921   CREATININE 1.18 08/15/2012 0921      Component Value Date/Time   CALCIUM 9.3 08/15/2012 0921   ALKPHOS 49 08/15/2012 0921   AST 41* 08/15/2012 0921   ALT 47 08/15/2012 0921   BILITOT 0.8 08/15/2012 0921     Hemoglobin A1c is 6.5  Last optometry/ophthalmology exam reviewed from:    Assessment:        Plan:    1.  Rx changes: none 2.  Education: Reviewed 'ABCs' of diabetes management (respective goals in parentheses):  A1C (<7), blood pressure (<130/80), and cholesterol (LDL <100). 3.  Compliance at present is estimated to be good. Efforts to improve compliance (if necessary) will be directed at increased exercise and regular blood sugar monitoring: daily. 4. Follow up: 4 months

## 2013-02-28 ENCOUNTER — Other Ambulatory Visit: Payer: Self-pay | Admitting: *Deleted

## 2013-02-28 MED ORDER — NIACIN ER (ANTIHYPERLIPIDEMIC) 1000 MG PO TBCR: 2000.0000 mg | EXTENDED_RELEASE_TABLET | Freq: Every day | ORAL | Status: DC

## 2013-03-22 ENCOUNTER — Telehealth: Payer: Self-pay | Admitting: Cardiovascular Disease

## 2013-03-22 NOTE — Telephone Encounter (Signed)
Changing insurance company-Need a substitute for his Vytorin and Niaspan-!

## 2013-03-22 NOTE — Telephone Encounter (Signed)
Message forwarded to Dr. Allyson Sabal for review and further instructions.  Paper chart# [id number] on Dr. Hazle Coca cart.

## 2013-03-22 NOTE — Telephone Encounter (Signed)
Atorvastatin 80. Stay on Niaspan

## 2013-03-25 NOTE — Telephone Encounter (Signed)
i spoke with Jeremy Reeves and he wants me to speak with his wife about changing the vytorin.  She will call me back tomorrow

## 2013-03-26 MED ORDER — ATORVASTATIN CALCIUM 80 MG PO TABS: 80.0000 mg | ORAL_TABLET | Freq: Every day | ORAL | Status: DC

## 2013-03-26 NOTE — Telephone Encounter (Signed)
Jeremy Reeves [name] is returning your call

## 2013-03-26 NOTE — Telephone Encounter (Signed)
i spoke with Mrs. Humiston about the medication change.  She requested that I mail her the prescription for atorvastatin 80mg .

## 2013-04-05 ENCOUNTER — Telehealth: Payer: Self-pay | Admitting: Internal Medicine

## 2013-04-05 NOTE — Telephone Encounter (Signed)
Faxed over medical records to [facility] @ [phone] 

## 2013-05-08 ENCOUNTER — Other Ambulatory Visit: Payer: Self-pay | Admitting: *Deleted

## 2013-05-08 MED ORDER — NIACIN ER (ANTIHYPERLIPIDEMIC) 1000 MG PO TBCR: 2000.0000 mg | EXTENDED_RELEASE_TABLET | Freq: Every day | ORAL | Status: DC

## 2013-05-08 MED ORDER — ATORVASTATIN CALCIUM 80 MG PO TABS: 80.0000 mg | ORAL_TABLET | Freq: Every day | ORAL | Status: DC

## 2013-05-08 MED ORDER — CARVEDILOL 3.125 MG PO TABS: 3.1250 mg | ORAL_TABLET | Freq: Two times a day (BID) | ORAL | Status: DC

## 2013-05-10 ENCOUNTER — Telehealth: Payer: Self-pay | Admitting: Internal Medicine

## 2013-05-10 MED ORDER — OMEGA-3-ACID ETHYL ESTERS 1 G PO CAPS: 2.0000 g | ORAL_CAPSULE | Freq: Two times a day (BID) | ORAL | Status: DC

## 2013-05-10 NOTE — Telephone Encounter (Signed)
Refill request for omega-3 acid to [facility]

## 2013-05-14 ENCOUNTER — Other Ambulatory Visit: Payer: Self-pay

## 2013-05-18 DIAGNOSIS — B029 Zoster without complications: Secondary | ICD-10-CM

## 2013-05-18 HISTORY — DX: Zoster without complications: B02.9

## 2013-05-24 ENCOUNTER — Ambulatory Visit: Payer: MEDICARE

## 2013-05-24 ENCOUNTER — Other Ambulatory Visit: Payer: Self-pay | Admitting: Internal Medicine

## 2013-05-24 ENCOUNTER — Other Ambulatory Visit (HOSPITAL_COMMUNITY)
Admission: RE | Admit: 2013-05-24 | Discharge: 2013-05-24 | Disposition: A | Discharge status: Home / Self Care | Payer: MEDICARE | Source: Ambulatory Visit | Attending: Internal Medicine | Admitting: Internal Medicine

## 2013-05-24 DIAGNOSIS — Z113 Encounter for screening for infections with a predominantly sexual mode of transmission: Secondary | ICD-10-CM | POA: Diagnosis not present

## 2013-05-24 DIAGNOSIS — B20 Human immunodeficiency virus [HIV] disease: Secondary | ICD-10-CM | POA: Insufficient documentation

## 2013-05-24 DIAGNOSIS — Z79899 Other long term (current) drug therapy: Secondary | ICD-10-CM | POA: Diagnosis not present

## 2013-05-24 LAB — CBC WITH DIFFERENTIAL/PLATELET
Basophils Absolute: 0 10*3/uL (ref 0.0–0.1)
HCT: 44.1 % (ref 39.0–52.0)
Lymphocytes Relative: 32 % (ref 12–46)
Monocytes Absolute: 0.6 10*3/uL (ref 0.1–1.0)
Neutro Abs: 1.8 10*3/uL (ref 1.7–7.7)
Neutrophils Relative %: 49 % (ref 43–77)
Platelets: 112 10*3/uL — ABNORMAL LOW (ref 150–400)
RDW: 16.4 % — ABNORMAL HIGH (ref 11.5–15.5)
WBC: 3.8 10*3/uL — ABNORMAL LOW (ref 4.0–10.5)

## 2013-05-24 LAB — URINALYSIS
Bilirubin Urine: NEGATIVE
Nitrite: NEGATIVE
Specific Gravity, Urine: 1.011 (ref 1.005–1.030)
Urobilinogen, UA: 0.2 mg/dL (ref 0.0–1.0)
pH: 6 (ref 5.0–8.0)

## 2013-05-24 LAB — COMPLETE METABOLIC PANEL WITH GFR
ALT: 40 U/L (ref 0–53)
AST: 38 U/L — ABNORMAL HIGH (ref 0–37)
Albumin: 4.2 g/dL (ref 3.5–5.2)
Calcium: 9.4 mg/dL (ref 8.4–10.5)
Chloride: 102 mEq/L (ref 96–112)
Potassium: 4.4 mEq/L (ref 3.5–5.3)
Sodium: 137 mEq/L (ref 135–145)
Total Protein: 9.1 g/dL — ABNORMAL HIGH (ref 6.0–8.3)

## 2013-05-24 LAB — HIV ANTIBODY (ROUTINE TESTING W REFLEX): HIV: REACTIVE

## 2013-05-24 LAB — HEPATITIS B SURFACE ANTIGEN: Hepatitis B Surface Ag: NEGATIVE

## 2013-05-24 LAB — HEPATITIS C ANTIBODY: HCV Ab: NEGATIVE

## 2013-05-24 LAB — RPR

## 2013-05-24 LAB — LIPID PANEL
Cholesterol: 85 mg/dL (ref 0–200)
HDL: 31 mg/dL — ABNORMAL LOW (ref >39)
Triglycerides: 62 mg/dL (ref <150)

## 2013-05-24 NOTE — Progress Notes (Signed)
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  Patient applied for a life insurance and policy which was denied due to positive HIV test.  His last sexual intercourse with an unknown male was at least 10 years ago and he has not been sexually active with his wife for 10 years. His wife has  tested negative for HIV with the [facility] Department.   They are trying to cope with this new diagnosis.   He has no symptoms at this time.  I  have suggested he schedule an appointment with Bernette Redbird of [facility] the [place] for counseling services.  We had a very simple in dept discussion regarding HIV symptoms, transmission, prevention, medication  and AIDS related illnesses. He now feels he has a better understanding of HIV . Patient was encouraged to bring his spouse for visit with Dr Drue Second  if she is agreeable.  He is eager to start treatment.  No vaccines given today.   Laurell Josephs, RN

## 2013-05-25 LAB — T-HELPER CELL (CD4) - (RCID CLINIC ONLY): CD4 T Cell Abs: 180 uL — ABNORMAL LOW (ref 400–2700)

## 2013-05-25 LAB — HEPATITIS A ANTIBODY, TOTAL: Hep A Total Ab: NEGATIVE

## 2013-05-28 ENCOUNTER — Ambulatory Visit: Payer: MEDICARE | Admitting: Family Medicine

## 2013-05-29 LAB — HIV 1/2 CONFIRMATION

## 2013-05-29 LAB — HIV-1 GENOTYPR PLUS

## 2013-06-08 ENCOUNTER — Ambulatory Visit (INDEPENDENT_AMBULATORY_CARE_PROVIDER_SITE_OTHER): Payer: Medicare Other | Admitting: Internal Medicine

## 2013-06-08 ENCOUNTER — Encounter: Payer: Self-pay | Admitting: Internal Medicine

## 2013-06-08 ENCOUNTER — Telehealth: Payer: Self-pay

## 2013-06-08 VITALS — BP 183/142 | HR 71 | Temp 98.2°F | Ht 67.0 in | Wt 185.5 lb

## 2013-06-08 DIAGNOSIS — M79604 Pain in right leg: Secondary | ICD-10-CM

## 2013-06-08 DIAGNOSIS — Z23 Encounter for immunization: Secondary | ICD-10-CM

## 2013-06-08 DIAGNOSIS — M79609 Pain in unspecified limb: Secondary | ICD-10-CM

## 2013-06-08 DIAGNOSIS — B20 Human immunodeficiency virus [HIV] disease: Secondary | ICD-10-CM

## 2013-06-08 MED ORDER — OXYCODONE-ACETAMINOPHEN 5-325 MG PO TABS: 1.0000 | ORAL_TABLET | Freq: Three times a day (TID) | ORAL | Status: DC | PRN

## 2013-06-08 MED ORDER — SULFAMETHOXAZOLE-TRIMETHOPRIM 400-80 MG PO TABS: 1.0000 | ORAL_TABLET | Freq: Every day | ORAL | Status: DC

## 2013-06-08 NOTE — Progress Notes (Signed)
[facility] CLINIC NOTE  RFV: routine  Subjective:    Patient ID: Jeremy Reeves, male    DOB: 1941-04-13, 72 y.o.   MRN: [id number]  HPI Mr. Catala 72 yo M with CAD, prediabetic, who recently tested positive for HIV, in setting of life insurance screening. He is a retired Naval architect and recalls having unprotected sex with csw x 2-3 women, many years ago; last tested negative for HIV 11 years ago. He is married, retired from truck driving in [id number]. No sex with men; no IVDU. His wife has been tested recently and she is negative. They have not had sexual intercourse in the last few years.  Allergies  Allergen Reactions  . Peanut-Containing Drug Products      Current Outpatient Prescriptions on File Prior to Visit  Medication Sig Dispense Refill  . aspirin 81 MG tablet Take 81 mg by mouth daily.      Marland Kitchen atorvastatin (LIPITOR) 80 MG tablet Take 1 tablet (80 mg total) by mouth daily.  90 tablet  3  . carvedilol (COREG) 3.125 MG tablet Take 1 tablet (3.125 mg total) by mouth 2 (two) times daily with a meal.  180 tablet  3  . glucose blood test strip This is for contour meter  100 each  prn  . Lancets MISC This is for the contour  100 each  prn  . Multiple Vitamins-Minerals (MULTIVITAMIN WITH MINERALS) tablet Take 1 tablet by mouth daily.        . niacin (NIASPAN) 1000 MG CR tablet Take 2 tablets (2,000 mg total) by mouth at bedtime.  180 tablet  3  . omega-3 acid ethyl esters (LOVAZA) 1 G capsule Take 2 capsules (2 g total) by mouth 2 (two) times daily.  360 capsule  3  . omeprazole (PRILOSEC) 20 MG capsule Take 20 mg by mouth daily.      . vitamin E 400 UNIT capsule Take 400 Units by mouth daily.         No current facility-administered medications on file prior to visit.   Active Ambulatory Problems    Diagnosis Date Noted  . ASHD (arteriosclerotic heart disease) 03/18/2011  . BPH (benign prostatic hyperplasia) 03/18/2011  . Hyperlipidemia LDL goal <70 03/18/2011  . Diabetes mellitus  03/18/2011  . Hypertension associated with diabetes 01/25/2013  . HIV disease 05/24/2013   Resolved Ambulatory Problems    Diagnosis Date Noted  . No Resolved Ambulatory Problems   Past Medical History  Diagnosis Date  . Diabetes mellitus   . Hyperlipidemia   . Hiatal hernia   . 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 Allyson Sabal ; Edmonia Caprio [id number], EGD 2012  Vax: shingle vax 12/19/09, pneumococcal 2009; other imms reviewed  Family hx: heart disease  Social hx: stopped smoking, 1999; no ETOH; retired Naval architect; works doing Aeronautical engineer, part-time. Work in [employer]. Married x 29 yo. Wife tested negative. 2 sons, 63 yo and 2 yo.   Review of Systems  Constitutional: Negative for fever, chills, diaphoresis, activity change, appetite change, fatigue and unexpected weight change.  HENT: Negative for congestion, sore throat, rhinorrhea, sneezing, trouble swallowing and sinus pressure.  Eyes: Negative for photophobia and visual disturbance.  Respiratory: Negative for cough, chest tightness, shortness of breath, wheezing and stridor.  Cardiovascular: Negative for chest pain, palpitations and leg swelling.  Gastrointestinal: Negative for nausea, vomiting, abdominal pain, diarrhea, constipation, blood in stool,  abdominal distention and anal bleeding.  Genitourinary: Negative for dysuria, hematuria, flank pain and difficulty urinating.  Musculoskeletal: right hip pain that radiates down to leg Skin: small red lesion, painful erupted on right lower leg  Neurological: Negative for dizziness, tremors, weakness and light-headedness.  Hematological: Negative for adenopathy. Does not bruise/bleed easily.  Psychiatric/Behavioral: Negative for behavioral problems, confusion, sleep disturbance, dysphoric mood, decreased concentration and agitation.       Objective:   Physical Exam BP 183/142  Pulse 71  Temp(Src) 98.2 F (36.8  C) (Oral)  Ht 5\' 7"  (1.702 m)  Wt 185 lb 8 oz (84.142 kg)  BMI 29.05 kg/m2  Physical Exam  Constitutional:  oriented to person, place, and time. appears well-developed and well-nourished. No distress.  HENT:  Mouth/Throat: Oropharynx is clear and moist. No oropharyngeal exudate.  Cardiovascular: Normal rate, regular rhythm and normal heart sounds. Exam reveals no gallop and no friction rub.  No murmur heard.  Chest: midline scar from thoracotomy Pulmonary/Chest: Effort normal and breath sounds normal. No respiratory distress. He has no wheezes.  Abdominal: Soft. Bowel sounds are normal. He exhibits no distension. There is no tenderness.  Lymphadenopathy:  no cervical adenopathy.  Back : no tenderness on palpation at SI joint, or flank or spinous process Neurological: He is alert and oriented to person, place, and time. CN2-12 intact. Gross motor strength intact Skin: Skin is warm and dry. No rash noted. No erythema.  Psychiatric: He has a normal mood and affect. His behavior is normal.        Assessment & Plan:  HIV = GFR 61. Won't be able to get stribild since GFR<70. We will see if can do tivicay/epzicom. We will check [id number] today. Will have him meet with barb to start patient assistance paperwork  oi proph = will do bactrim SS daily;   Health maintenance = will give flu shot today; will plan on giving pneumococcal once CD 4 count >200 within next 3-56months. He has had pneumococcal in 2009. Re: HBV series, we will give it one we initiate HIV treatment in 10 days.  HTN = elevated BP today secondary to pain and "white coat" syndrome. Will ask him to see pcp next week to see if meds need to be adjusted. He is asymptomatic  Presumed sciatica = still recommended that he follow up with pcp. Will give rx for #20 tabs NR of percocet  rtc in 10 days for results and initiation of treatment

## 2013-06-08 NOTE — Telephone Encounter (Signed)
Spoke with patient and his wife today regarding SPAP program - she stated that they are over the income limit for SPAP - which is 300% [employer] - [phone] income for 2 people - advised them to call Randolm Idol Monday to see if there was pat. asst available - wasn't sure about medicare patients.

## 2013-06-12 ENCOUNTER — Encounter (HOSPITAL_COMMUNITY): Payer: Self-pay | Admitting: *Deleted

## 2013-06-12 ENCOUNTER — Emergency Department (INDEPENDENT_AMBULATORY_CARE_PROVIDER_SITE_OTHER)
Admission: EM | Admit: 2013-06-12 | Discharge: 2013-06-12 | Disposition: A | Discharge status: Home / Self Care | Payer: MEDICARE | Source: Home / Self Care

## 2013-06-12 ENCOUNTER — Telehealth: Payer: Self-pay | Admitting: *Deleted

## 2013-06-12 DIAGNOSIS — M5416 Radiculopathy, lumbar region: Secondary | ICD-10-CM

## 2013-06-12 DIAGNOSIS — IMO0002 Reserved for concepts with insufficient information to code with codable children: Secondary | ICD-10-CM

## 2013-06-12 MED ORDER — PREDNISONE 10 MG PO KIT: PACK | ORAL | Status: DC

## 2013-06-12 MED ORDER — TRAMADOL HCL 50 MG PO TABS: 50.0000 mg | ORAL_TABLET | Freq: Four times a day (QID) | ORAL | Status: DC | PRN

## 2013-06-12 NOTE — ED Notes (Signed)
Pt   Has  Several  Complaints         This  Includes        Pain  r  Leg        With  Pain radiating      Downwards         X  1  Week   He  Also  Reports  He  Applied  Icy  Hot to the  Affected  Leg  And        Developed   A   Rash      He  Also reports     He  Stuck    A   screwdribver in his  Hand  sev  Days

## 2013-06-12 NOTE — Telephone Encounter (Signed)
Jeremy Reeves came in today and applied for the [employer]  He was approved for $4000.00  06-12-13 - 06-11-14.

## 2013-06-12 NOTE — ED Provider Notes (Signed)
Jeremy Reeves is a 72 y.o. male who presents to Urgent Care today for right leg pain and rash present for about one week. The rash has been present for about 4 days. Patient has pain in his right lumbar back radiating to his right toes. Worse with activity and better with rest. The pain is moderate to severe. He's tried using some icy hot which did not help and which seemed to cause a rash on his right foot. He has subsequently discontinued the icy hot. He denies any history of shingles and knows that he had a zoster vaccine last year.  He denies any fevers or chills nausea vomiting diarrhea chest pains or palpitations. He feels well otherwise.    PMH reviewed. Coronary artery disease status post CABG History  Substance Use Topics  . Smoking status: Former Smoker    Quit date: 10/18/1997  . Smokeless tobacco: Never Used  . Alcohol Use: No   ROS as above Medications reviewed. No current facility-administered medications for this encounter.   Current Outpatient Prescriptions  Medication Sig Dispense Refill  . aspirin 81 MG tablet Take 81 mg by mouth daily.      Marland Kitchen atorvastatin (LIPITOR) 80 MG tablet Take 1 tablet (80 mg total) by mouth daily.  90 tablet  3  . carvedilol (COREG) 3.125 MG tablet Take 1 tablet (3.125 mg total) by mouth 2 (two) times daily with a meal.  180 tablet  3  . glucose blood test strip This is for contour meter  100 each  prn  . Lancets MISC This is for the contour  100 each  prn  . Multiple Vitamins-Minerals (MULTIVITAMIN WITH MINERALS) tablet Take 1 tablet by mouth daily.        . niacin (NIASPAN) 1000 MG CR tablet Take 2 tablets (2,000 mg total) by mouth at bedtime.  180 tablet  3  . omega-3 acid ethyl esters (LOVAZA) 1 G capsule Take 2 capsules (2 g total) by mouth 2 (two) times daily.  360 capsule  3  . omeprazole (PRILOSEC) 20 MG capsule Take 20 mg by mouth daily.      Marland Kitchen oxyCODONE-acetaminophen (PERCOCET/ROXICET) 5-325 MG per tablet Take 1 tablet by mouth every 8  (eight) hours as needed for pain.  20 tablet  0  . PredniSONE 10 MG KIT 12 day dose pack po  1 kit  0  . sulfamethoxazole-trimethoprim (SEPTRA) 400-80 MG per tablet Take 1 tablet by mouth daily.  30 tablet  3  . traMADol (ULTRAM) 50 MG tablet Take 1 tablet (50 mg total) by mouth every 6 (six) hours as needed for pain.  15 tablet  0  . vitamin E 400 UNIT capsule Take 400 Units by mouth daily.          Exam:  BP 165/100  Pulse 72  Temp(Src) 98.8 F (37.1 C) (Oral)  Resp 12  SpO2 98% Gen: Well NAD HEENT: EOMI,  MMM Lungs: CTABL Nl WOB Heart: RRR no MRG Abd: NABS, NT, ND Exts: Non edematous BL  LE, warm and well perfused. Capillary refill and sensation intact distally bilateral lower extremities Back: Nontender to spinal midline. Normal range of motion. Strength is intact bilateral lower extremities. Skin: Grouped vesicles on the plantar and dorsal right foot.   No results found for this or any previous visit (from the past 24 hour(s)). No results found.  Assessment and Plan: 72 y.o. male with lumbar radicular pain versus shingles.  Unclear which is the causal agent  at this time. However patient's have been present for too long for antiviral medication to be very effective.  Plan to use prednisone dose pack as well as tramadol.  Discontinue icy hot. Followup with primary care provider for blood pressure recheck Discussed warning signs or symptoms. Please see discharge instructions. Patient expresses understanding.      Rodolph Bong, MD 06/12/13 [phone]

## 2013-06-13 LAB — HLA B*5701: HLA-B*5701: NEGATIVE

## 2013-06-15 ENCOUNTER — Telehealth: Payer: Self-pay | Admitting: Family Medicine

## 2013-06-15 ENCOUNTER — Other Ambulatory Visit: Payer: Self-pay | Admitting: Medical

## 2013-06-15 ENCOUNTER — Ambulatory Visit (INDEPENDENT_AMBULATORY_CARE_PROVIDER_SITE_OTHER): Payer: MEDICARE | Admitting: Medical

## 2013-06-15 ENCOUNTER — Encounter: Payer: Self-pay | Admitting: Medical

## 2013-06-15 VITALS — BP 120/80 | HR 88 | Temp 98.3°F | Resp 18 | Wt 182.0 lb

## 2013-06-15 DIAGNOSIS — B029 Zoster without complications: Secondary | ICD-10-CM | POA: Diagnosis not present

## 2013-06-15 DIAGNOSIS — E119 Type 2 diabetes mellitus without complications: Secondary | ICD-10-CM | POA: Diagnosis not present

## 2013-06-15 DIAGNOSIS — B20 Human immunodeficiency virus [HIV] disease: Secondary | ICD-10-CM | POA: Diagnosis not present

## 2013-06-15 MED ORDER — VALACYCLOVIR HCL 1 G PO TABS: 1000.0000 mg | ORAL_TABLET | Freq: Three times a day (TID) | ORAL | Status: DC

## 2013-06-15 NOTE — Progress Notes (Signed)
Subjective:  Jeremy Reeves is a 72 y.o. male with a hx/o diabetes, heart disease and newly diagnosed HIV who presents with "shingles."  He was seen for first visit with infectious disease about week ago.   They have not started HIV therapy yet, awaiting insurance approval and consideration of options given reduced GFR.    He was seen a few days ago at urgent care for back pain and rash.   They advised that he could have back pain/sciatica vs shingles.  He does have rash that started about a week ago on his right leg/foot.  He is here today for painful blistering rash on right lower leg lateral and somewhat medially that includes right great toe and 2nd toe.  Painful to walk and put pressure on the foot.   Back is still hurting some but feels like pain travels down back to foot on the right.     Wife notes that she has been tested since husband's diagnosis recently and is HIV negative.  He currently denies fever, chills, nausea, vomiting, arthralgias and myalgias otherwise.  No other rash.  No injury or trauma.  He was prescribed oxycodone and prednisone pack at urgent care for the symptoms, advised that antivirals wouldn't help at this point given timing of the rash.   He notes having shingles vaccine 2011.  No other aggravating or relieving factors.    No other c/o.  The following portions of the patient's history were reviewed and updated as appropriate: allergies, current medications, past family history, past medical history, past social history, past surgical history and problem list.  ROS Otherwise as in subjective above  Objective: Physical Exam  Vital signs reviewed  General appearance: alert, no distress, WD/WN, AA male Back: nontender, unable to really appreciate pain with ROM due to problems bearing weight of right foot, but ROM seems to be generally good without much pain Pulses:  2+ pedal pulses, normal cap refill Ext: right foot seems somewhat swollen along medial side Skin:  right lower leg and foot dorsally with linear L5 dermatomal pattern of patches of vesicular lesions with some erythema, suggestive of shingles   Assessment: Encounter Diagnoses  Name Primary?  . Shingles outbreak Yes  . HIV disease   . Diabetes      Plan: Discussed shingles diagnosis in light of recent HIV diagnosis and recent labs showing low CD4 count and active HIV virus.   He is currently taking Prednisone 10mg  dose pack and Oxycodone pain medication.  Discussed preventative measures, precautions.   I will call infectious disease once they open today and try to get any other recommendations for his care at this time.   He does have f/u with ID next week in general.  Labs were draw in the event we need to send labs.  I spoke with Dr. Ninetta Lights with infectious disease.  After speaking with him and getting some recommendations, he advised to stop prednisone, don't use topical steroids either, and he can begin Valtrex since the rash is not scaling yet.  He has f/u planned with infectious disease next week.  No labs will be sent.

## 2013-06-15 NOTE — Telephone Encounter (Signed)
I spoke with the patient about what Crosby Oyster PA-C recommended for his treatment as far as medications and what to take and what not to take. Patient agreed and understood. CLS

## 2013-06-15 NOTE — Telephone Encounter (Signed)
Message copied by Janeice Robinson on Fri Jun 15, 2013  2:16 PM ------      Message from: Jac Canavan      Created: Fri Jun 15, 2013  1:58 PM       I spoke to Dr. Ninetta Lights from infectious disease.  He advised that he STOP the prednisone.  Can c/t the pain medication, but he did recommend starting Valtrex.  Thus, begin Valtrex and f/u with infectious disease next week as planned. ------

## 2013-06-15 NOTE — Telephone Encounter (Signed)
Message copied by Janeice Robinson on Fri Jun 15, 2013  2:05 PM ------      Message from: Jac Canavan      Created: Fri Jun 15, 2013  1:58 PM       I spoke to Dr. Ninetta Lights from infectious disease.  He advised that he STOP the prednisone.  Can c/t the pain medication, but he did recommend starting Valtrex.  Thus, begin Valtrex and f/u with infectious disease next week as planned. ------

## 2013-06-19 ENCOUNTER — Ambulatory Visit: Payer: MEDICARE | Admitting: Internal Medicine

## 2013-06-20 ENCOUNTER — Ambulatory Visit: Payer: MEDICARE

## 2013-06-20 ENCOUNTER — Ambulatory Visit (INDEPENDENT_AMBULATORY_CARE_PROVIDER_SITE_OTHER): Payer: MEDICARE | Admitting: Internal Medicine

## 2013-06-20 ENCOUNTER — Encounter: Payer: Self-pay | Admitting: Internal Medicine

## 2013-06-20 VITALS — BP 128/82 | HR 76 | Temp 97.6°F | Ht 67.0 in | Wt 182.5 lb

## 2013-06-20 DIAGNOSIS — M79604 Pain in right leg: Secondary | ICD-10-CM

## 2013-06-20 DIAGNOSIS — B20 Human immunodeficiency virus [HIV] disease: Secondary | ICD-10-CM | POA: Diagnosis not present

## 2013-06-20 DIAGNOSIS — M79609 Pain in unspecified limb: Secondary | ICD-10-CM

## 2013-06-20 MED ORDER — DOLUTEGRAVIR SODIUM 50 MG PO TABS: 50.0000 mg | ORAL_TABLET | Freq: Every day | ORAL | Status: DC

## 2013-06-20 MED ORDER — ABACAVIR SULFATE-LAMIVUDINE 600-300 MG PO TABS: 1.0000 | ORAL_TABLET | Freq: Every day | ORAL | Status: DC

## 2013-06-20 MED ORDER — OXYCODONE-ACETAMINOPHEN 5-325 MG PO TABS: 1.0000 | ORAL_TABLET | Freq: Three times a day (TID) | ORAL | Status: DC | PRN

## 2013-06-20 NOTE — Progress Notes (Signed)
[facility] CLIN[facility] NOTE  RFV: routine, follow up to recent shingles diagnosis Subjective:    Patient ID: Jeremy Reeves, male    DOB: 28-Dec-1940, 72 y.o.   MRN: [id number]  HPI Jeremy Reeves, 72yo M with newly diagnosed HIV, Cd 4 count of 180(16%)/VL 53,791. Genotype naive. Found to have shingles started on valtrex last week by PCP. Was approved for patient assistance.  Current Outpatient Prescriptions on File Prior to Visit  Medication Sig Dispense Refill  . aspirin 81 MG tablet Take 81 mg by mouth daily.      Marland Kitchen atorvastatin (LIPITOR) 80 MG tablet Take 1 tablet (80 mg total) by mouth daily.  90 tablet  3  . carvedilol (COREG) 3.125 MG tablet Take 1 tablet (3.125 mg total) by mouth 2 (two) times daily with a meal.  180 tablet  3  . glucose blood test strip This is for contour meter  100 each  prn  . Lancets MISC This is for the contour  100 each  prn  . Multiple Vitamins-Minerals (MULTIVITAMIN WITH MINERALS) tablet Take 1 tablet by mouth daily.        . niacin (NIASPAN) 1000 MG CR tablet Take 2 tablets (2,000 mg total) by mouth at bedtime.  180 tablet  3  . omega-3 acid ethyl esters (LOVAZA) 1 G capsule Take 2 capsules (2 g total) by mouth 2 (two) times daily.  360 capsule  3  . omeprazole (PRILOSEC) 20 MG capsule Take 20 mg by mouth daily.      Marland Kitchen oxyCODONE-acetaminophen (PERCOCET/ROXICET) 5-325 MG per tablet Take 1 tablet by mouth every 8 (eight) hours as needed for pain.  20 tablet  0  . sulfamethoxazole-trimethoprim (SEPTRA) 400-80 MG per tablet Take 1 tablet by mouth daily.  30 tablet  3  . traMADol (ULTRAM) 50 MG tablet Take 1 tablet (50 mg total) by mouth every 6 (six) hours as needed for pain.  15 tablet  0  . valACYclovir (VALTREX) 1000 MG tablet Take 1 tablet (1,000 mg total) by mouth 3 (three) times daily.  21 tablet  0  . vitamin E 400 UNIT capsule Take 400 Units by mouth daily.         No current facility-administered medications on file prior to visit.   Active Ambulatory  Problems    Diagnosis Date Noted  . ASHD (arteriosclerotic heart disease) 03/18/2011  . BPH (benign prostatic hyperplasia) 03/18/2011  . Hyperlipidemia LDL goal <70 03/18/2011  . Diabetes mellitus 03/18/2011  . Hypertension associated with diabetes 01/25/2013  . HIV disease 05/24/2013   Resolved Ambulatory Problems    Diagnosis Date Noted  . No Resolved Ambulatory Problems   Past Medical History  Diagnosis Date  . Diabetes mellitus   . Hyperlipidemia   . Hiatal hernia   . HH (hiatus hernia)        Review of Systems Has had some constipation due to pain medication use    Objective:   Physical Exam BP 128/82  Pulse 76  Temp(Src) 97.6 F (36.4 C) (Oral)  Ht 5\' 7"  (1.702 m)  Wt 182 lb 8 oz (82.781 kg)  BMI 28.58 kg/m2 Physical Exam  Constitutional: He is oriented to person, place, and time. He appears well-developed and well-nourished. No distress.  HENT:  Mouth/Throat: Oropharynx is clear and moist. No oropharyngeal exudate.  Cardiovascular: Normal rate, regular rhythm and normal heart sounds. Exam reveals no gallop and no friction rub.  No murmur heard.  Pulmonary/Chest: Effort normal and breath  sounds normal. No respiratory distress. He has no wheezes.  Abdominal: Soft. Bowel sounds are normal. He exhibits no distension. There is no tenderness.  Lymphadenopathy:  He has no cervical adenopathy.  Neurological: He is alert and oriented to person, place, and time.  Skin: right foot numerous vesicular lession to dorsum of foot and distal half of shin and involving sole of foot. Psychiatric: He has a normal mood and affect. His behavior is normal.          Assessment & Plan:  HIV = patient received approval for Patient assistance with co-pay of $4K for the year. We will start him on tivicay & epzicom due to renal failure.  PCP prophylaxis = will do bactrim SS daily  Shingles = continue with valtrex til course complete. Will also give refill on pain medication #30  no RF  Constipation = try miralax  rtc in 4 wk

## 2013-06-22 ENCOUNTER — Other Ambulatory Visit: Payer: Self-pay | Admitting: Licensed Clinical Social Worker

## 2013-06-22 DIAGNOSIS — B029 Zoster without complications: Secondary | ICD-10-CM

## 2013-06-22 MED ORDER — VALACYCLOVIR HCL 1 G PO TABS: 1000.0000 mg | ORAL_TABLET | Freq: Three times a day (TID) | ORAL | Status: DC

## 2013-06-25 ENCOUNTER — Telehealth: Payer: Self-pay | Admitting: *Deleted

## 2013-06-25 ENCOUNTER — Encounter: Payer: Self-pay | Admitting: Medical

## 2013-06-25 ENCOUNTER — Ambulatory Visit (INDEPENDENT_AMBULATORY_CARE_PROVIDER_SITE_OTHER): Payer: MEDICARE | Admitting: Medical

## 2013-06-25 VITALS — BP 110/80 | HR 60 | Temp 97.9°F | Resp 16 | Wt 178.0 lb

## 2013-06-25 DIAGNOSIS — N400 Enlarged prostate without lower urinary tract symptoms: Secondary | ICD-10-CM | POA: Diagnosis not present

## 2013-06-25 DIAGNOSIS — B20 Human immunodeficiency virus [HIV] disease: Secondary | ICD-10-CM | POA: Diagnosis not present

## 2013-06-25 DIAGNOSIS — R350 Frequency of micturition: Secondary | ICD-10-CM

## 2013-06-25 DIAGNOSIS — R3911 Hesitancy of micturition: Secondary | ICD-10-CM

## 2013-06-25 LAB — POCT URINALYSIS DIPSTICK
Bilirubin, UA: NEGATIVE
Blood, UA: NEGATIVE
Glucose, UA: NEGATIVE
Spec Grav, UA: 1.015
pH, UA: 5

## 2013-06-25 MED ORDER — TAMSULOSIN HCL 0.4 MG PO CAPS: 0.4000 mg | ORAL_CAPSULE | Freq: Every day | ORAL | Status: DC

## 2013-06-25 NOTE — Telephone Encounter (Signed)
Pt's wife called reporting that he has been urinating every 15 minutes - hour.  Per wife, this is after starting valtrex.  RN asked if there were any other medication changes or symptoms, they answered no.  RN suggested that she call her husband's PCP office to see if they can see him today, as we do not have any slots available.  Pt's wife in agreement. Andree Coss, RN

## 2013-06-25 NOTE — Progress Notes (Signed)
Subjective: Here for urinary issue.  He was here recently for shingles infection which is improving.  He did start his antiviral therapy last week per infectious disease for new HIV infection.  He is here today for several week hx/o urinary frequency and urgency.  He reports frequent urination, urine hesitance, stream changes, urine dribbling, gets up at night to urinate as well.  Denies burning with urination, no cloudy urine, no hx/o UTI.  Denies blood in urine.  No penile discharge, no pus in urine.  Objective: Gen: wd, wn, nad Abdomen: nontender, no mass, no organomegaly Back nontender DRE: anus with small external hemorrhoid, normal rectal tone, prostate firm and generally enlarged, no specific nodule, no bogginess   Assessment: Encounter Diagnoses  Name Primary?  . Urinary frequency Yes  . Urinary hesitancy   . Benign prostatic hypertrophy    Plan: Fingerstick glucose checked today as well as urine dipstick.  Neither point to cause of the urinary frequency which seems to be more likely BPH related.  Begin Flomax.  We will send culture as well.  Advised if no improvements this next several days, to call back.  If other new symptoms or worse symptoms, let me know.

## 2013-06-26 ENCOUNTER — Encounter: Payer: Self-pay | Admitting: Internal Medicine

## 2013-06-26 ENCOUNTER — Ambulatory Visit (INDEPENDENT_AMBULATORY_CARE_PROVIDER_SITE_OTHER): Payer: MEDICARE | Admitting: Internal Medicine

## 2013-06-26 VITALS — Temp 98.1°F | Ht 67.0 in | Wt 180.2 lb

## 2013-06-26 DIAGNOSIS — B20 Human immunodeficiency virus [HIV] disease: Secondary | ICD-10-CM

## 2013-06-26 DIAGNOSIS — B029 Zoster without complications: Secondary | ICD-10-CM | POA: Diagnosis not present

## 2013-06-26 DIAGNOSIS — R300 Dysuria: Secondary | ICD-10-CM | POA: Diagnosis not present

## 2013-06-26 LAB — GC/CHLAMYDIA PROBE AMP: GC Probe RNA: NEGATIVE

## 2013-06-26 NOTE — Progress Notes (Signed)
[facility]  RFV: urinary pain Subjective:    Patient ID: Jeremy Reeves, male    DOB: 15-Sep-1941, 72 y.o.   MRN: [id number]  HPI71yo M with newly diagnosed with HIV, CD 4 count 180/58,000 recently started on tivicay/epzicom complicated by shingles. Went to PCP for dysuria and frequent urination throughout the day. Went to PCP for evaluation who felt he had BPH and given flomax. Fevers and nightsweats for weeks.  Poor sleep  Due to frequent urination.   Started HIV medications 4 days ago which he is doing ok with them.  Current Outpatient Prescriptions on File Prior to Visit  Medication Sig Dispense Refill  . abacavir-lamiVUDine (EPZICOM) 600-300 MG per tablet Take 1 tablet by mouth daily.  30 tablet  11  . aspirin 81 MG tablet Take 81 mg by mouth daily.      Marland Kitchen atorvastatin (LIPITOR) 80 MG tablet Take 1 tablet (80 mg total) by mouth daily.  90 tablet  3  . carvedilol (COREG) 3.125 MG tablet Take 1 tablet (3.125 mg total) by mouth 2 (two) times daily with a meal.  180 tablet  3  . dolutegravir (TIVICAY) 50 MG tablet Take 1 tablet (50 mg total) by mouth daily.  30 tablet  11  . glucose blood test strip This is for contour meter  100 each  prn  . Lancets MISC This is for the contour  100 each  prn  . Multiple Vitamins-Minerals (MULTIVITAMIN WITH MINERALS) tablet Take 1 tablet by mouth daily.        . niacin (NIASPAN) 1000 MG CR tablet Take 2 tablets (2,000 mg total) by mouth at bedtime.  180 tablet  3  . omega-3 acid ethyl esters (LOVAZA) 1 G capsule Take 2 capsules (2 g total) by mouth 2 (two) times daily.  360 capsule  3  . omeprazole (PRILOSEC) 20 MG capsule Take 20 mg by mouth daily.      Marland Kitchen oxyCODONE-acetaminophen (PERCOCET/ROXICET) 5-325 MG per tablet Take 1 tablet by mouth every 8 (eight) hours as needed for pain.  30 tablet  0  . sulfamethoxazole-trimethoprim (SEPTRA) 400-80 MG per tablet Take 1 tablet by mouth daily.  30 tablet  3  . tamsulosin (FLOMAX) 0.4 MG CAPS capsule Take  1 capsule (0.4 mg total) by mouth daily.  30 capsule  3  . valACYclovir (VALTREX) 1000 MG tablet Take 1 tablet (1,000 mg total) by mouth 3 (three) times daily.  21 tablet  0  . vitamin E 400 UNIT capsule Take 400 Units by mouth daily.         No current facility-administered medications on file prior to visit.   Active Ambulatory Problems    Diagnosis Date Noted  . ASHD (arteriosclerotic heart disease) 03/18/2011  . BPH (benign prostatic hyperplasia) 03/18/2011  . Hyperlipidemia LDL goal <70 03/18/2011  . Diabetes mellitus 03/18/2011  . Hypertension associated with diabetes 01/25/2013  . HIV disease 05/24/2013   Resolved Ambulatory Problems    Diagnosis Date Noted  . No Resolved Ambulatory Problems   Past Medical History  Diagnosis Date  . Diabetes mellitus   . Hyperlipidemia   . Hiatal hernia   . HH (hiatus hernia)       Review of Systems     Objective:   Physical Exam Temp(Src) 98.1 F (36.7 C) (Oral)  Ht 5\' 7"  (1.702 m)  Wt 180 lb 4 oz (81.761 kg)  BMI 28.22 kg/m2 Physical Exam  Constitutional: He is oriented to person,  place, and time. He appears well-developed and well-nourished. No distress.  HENT:   Mouth/Throat: Oropharynx is clear and moist. No oropharyngeal exudate.  Cardiovascular: Normal rate, regular rhythm and normal heart sounds. Exam reveals no gallop and no friction rub.  No murmur heard.  Pulmonary/Chest: Effort normal and breath sounds normal. No respiratory distress. He has no wheezes.  Abdominal: Soft. Bowel sounds are normal. He exhibits no distension. There is no tenderness.  Lymphadenopathy:  He has no cervical adenopathy.  Neurological: He is alert and oriented to person, place, and time.  Skin: Skin is warm and dry. No rash noted. No erythema.  Psychiatric: He has a normal mood and affect. His behavior is normal.       Assessment & Plan:  HIV = continue on tivicay/ epzicom daily  Frequent urination/dysuria = ua clean for UTI, alhough  patient is poor historian. Unclear if frequent urination is painful all the time but occasionally. Will presumptive treat for prostatitis with bactrim DS 1 bid x 7 days. See if it alleviates symptoms. If stil having frequent urination and dysuria, may need to consider imaging. Bactrim ds 1 bid x 5 days. Await culture results  Shingles = continue on valtrex  oi proph = continue on bactrim   Med adherence = michelle to talk to him

## 2013-06-28 LAB — URINE CULTURE
Colony Count: NO GROWTH
Organism ID, Bacteria: NO GROWTH

## 2013-06-29 ENCOUNTER — Other Ambulatory Visit: Payer: Self-pay | Admitting: Licensed Clinical Social Worker

## 2013-06-29 ENCOUNTER — Telehealth: Payer: Self-pay | Admitting: Licensed Clinical Social Worker

## 2013-06-29 DIAGNOSIS — B029 Zoster without complications: Secondary | ICD-10-CM

## 2013-06-29 MED ORDER — TRAMADOL HCL 50 MG PO TABS: 50.0000 mg | ORAL_TABLET | Freq: Two times a day (BID) | ORAL | Status: DC | PRN

## 2013-06-29 NOTE — Telephone Encounter (Signed)
Patient's wife called back stating that his condition has not improved since starting the flomax. Patient is not going to the bathroom as much, longer than than 15 minutes. He wanted to let Dr. Drue Second know that his shingles is drying up, but he wants something not as strong as Percocet. He did not take any today because he does not like the way it makes him feel.

## 2013-06-29 NOTE — Telephone Encounter (Signed)
Please disregard first rx for tramadol, it was sent in error

## 2013-07-04 ENCOUNTER — Inpatient Hospital Stay (HOSPITAL_COMMUNITY)
Admission: AD | Admit: 2013-07-04 | Discharge: 2013-07-06 | DRG: 682 | Disposition: A | Discharge status: Home / Self Care | Payer: MEDICARE | Source: Ambulatory Visit | Attending: Internal Medicine | Admitting: Internal Medicine

## 2013-07-04 ENCOUNTER — Telehealth: Payer: Self-pay | Admitting: Internal Medicine

## 2013-07-04 ENCOUNTER — Inpatient Hospital Stay (HOSPITAL_COMMUNITY): Payer: MEDICARE

## 2013-07-04 ENCOUNTER — Encounter (HOSPITAL_COMMUNITY): Payer: Self-pay | Admitting: General Practice

## 2013-07-04 ENCOUNTER — Ambulatory Visit (INDEPENDENT_AMBULATORY_CARE_PROVIDER_SITE_OTHER): Payer: MEDICARE | Admitting: Internal Medicine

## 2013-07-04 ENCOUNTER — Encounter: Payer: Self-pay | Admitting: Internal Medicine

## 2013-07-04 VITALS — BP 125/85 | HR 108 | Temp 98.0°F

## 2013-07-04 DIAGNOSIS — Z79899 Other long term (current) drug therapy: Secondary | ICD-10-CM

## 2013-07-04 DIAGNOSIS — I10 Essential (primary) hypertension: Secondary | ICD-10-CM | POA: Diagnosis present

## 2013-07-04 DIAGNOSIS — E119 Type 2 diabetes mellitus without complications: Secondary | ICD-10-CM | POA: Diagnosis not present

## 2013-07-04 DIAGNOSIS — G8929 Other chronic pain: Secondary | ICD-10-CM | POA: Diagnosis present

## 2013-07-04 DIAGNOSIS — E785 Hyperlipidemia, unspecified: Secondary | ICD-10-CM | POA: Diagnosis present

## 2013-07-04 DIAGNOSIS — N179 Acute kidney failure, unspecified: Principal | ICD-10-CM | POA: Diagnosis present

## 2013-07-04 DIAGNOSIS — E871 Hypo-osmolality and hyponatremia: Secondary | ICD-10-CM | POA: Diagnosis not present

## 2013-07-04 DIAGNOSIS — I951 Orthostatic hypotension: Secondary | ICD-10-CM | POA: Diagnosis present

## 2013-07-04 DIAGNOSIS — R3989 Other symptoms and signs involving the genitourinary system: Secondary | ICD-10-CM

## 2013-07-04 DIAGNOSIS — I2510 Atherosclerotic heart disease of native coronary artery without angina pectoris: Secondary | ICD-10-CM | POA: Diagnosis present

## 2013-07-04 DIAGNOSIS — I152 Hypertension secondary to endocrine disorders: Secondary | ICD-10-CM | POA: Diagnosis present

## 2013-07-04 DIAGNOSIS — N401 Enlarged prostate with lower urinary tract symptoms: Secondary | ICD-10-CM | POA: Diagnosis present

## 2013-07-04 DIAGNOSIS — K219 Gastro-esophageal reflux disease without esophagitis: Secondary | ICD-10-CM | POA: Diagnosis present

## 2013-07-04 DIAGNOSIS — N139 Obstructive and reflux uropathy, unspecified: Secondary | ICD-10-CM | POA: Diagnosis present

## 2013-07-04 DIAGNOSIS — N400 Enlarged prostate without lower urinary tract symptoms: Secondary | ICD-10-CM | POA: Diagnosis not present

## 2013-07-04 DIAGNOSIS — R339 Retention of urine, unspecified: Secondary | ICD-10-CM

## 2013-07-04 DIAGNOSIS — Z91010 Allergy to peanuts: Secondary | ICD-10-CM | POA: Diagnosis not present

## 2013-07-04 DIAGNOSIS — H5316 Psychophysical visual disturbances: Secondary | ICD-10-CM | POA: Diagnosis present

## 2013-07-04 DIAGNOSIS — M549 Dorsalgia, unspecified: Secondary | ICD-10-CM | POA: Diagnosis present

## 2013-07-04 DIAGNOSIS — R112 Nausea with vomiting, unspecified: Secondary | ICD-10-CM | POA: Diagnosis present

## 2013-07-04 DIAGNOSIS — B029 Zoster without complications: Secondary | ICD-10-CM | POA: Diagnosis present

## 2013-07-04 DIAGNOSIS — E872 Acidosis, unspecified: Secondary | ICD-10-CM | POA: Diagnosis not present

## 2013-07-04 DIAGNOSIS — B20 Human immunodeficiency virus [HIV] disease: Secondary | ICD-10-CM | POA: Diagnosis not present

## 2013-07-04 DIAGNOSIS — E1159 Type 2 diabetes mellitus with other circulatory complications: Secondary | ICD-10-CM

## 2013-07-04 DIAGNOSIS — Z7982 Long term (current) use of aspirin: Secondary | ICD-10-CM | POA: Diagnosis not present

## 2013-07-04 DIAGNOSIS — N200 Calculus of kidney: Secondary | ICD-10-CM | POA: Diagnosis not present

## 2013-07-04 DIAGNOSIS — I959 Hypotension, unspecified: Secondary | ICD-10-CM | POA: Diagnosis present

## 2013-07-04 DIAGNOSIS — Z951 Presence of aortocoronary bypass graft: Secondary | ICD-10-CM

## 2013-07-04 DIAGNOSIS — N289 Disorder of kidney and ureter, unspecified: Secondary | ICD-10-CM | POA: Diagnosis not present

## 2013-07-04 DIAGNOSIS — K449 Diaphragmatic hernia without obstruction or gangrene: Secondary | ICD-10-CM | POA: Diagnosis present

## 2013-07-04 DIAGNOSIS — N138 Other obstructive and reflux uropathy: Secondary | ICD-10-CM | POA: Diagnosis present

## 2013-07-04 DIAGNOSIS — R443 Hallucinations, unspecified: Secondary | ICD-10-CM | POA: Diagnosis not present

## 2013-07-04 DIAGNOSIS — Z87891 Personal history of nicotine dependence: Secondary | ICD-10-CM

## 2013-07-04 HISTORY — DX: Human immunodeficiency virus (HIV) disease: B20

## 2013-07-04 HISTORY — DX: Asymptomatic human immunodeficiency virus (hiv) infection status: Z21

## 2013-07-04 HISTORY — DX: Gastro-esophageal reflux disease without esophagitis: K21.9

## 2013-07-04 HISTORY — DX: Type 2 diabetes mellitus without complications: E11.9

## 2013-07-04 LAB — URINALYSIS, ROUTINE W REFLEX MICROSCOPIC
Bilirubin Urine: NEGATIVE
Glucose, UA: NEGATIVE mg/dL
Glucose, UA: NEGATIVE mg/dL
Ketones, ur: NEGATIVE mg/dL
Protein, ur: 100 mg/dL — AB
Specific Gravity, Urine: 1.014 (ref 1.005–1.030)
Specific Gravity, Urine: 1.008 (ref 1.005–1.030)

## 2013-07-04 LAB — COMPREHENSIVE METABOLIC PANEL
ALT: 28 U/L (ref 0–53)
AST: 31 U/L (ref 0–37)
AST: 29 U/L (ref 0–37)
Albumin: 2.9 g/dL — ABNORMAL LOW (ref 3.5–5.2)
BUN: 72 mg/dL — ABNORMAL HIGH (ref 6–23)
BUN: 69 mg/dL — ABNORMAL HIGH (ref 6–23)
Calcium: 9.1 mg/dL (ref 8.4–10.5)
Calcium: 8.8 mg/dL (ref 8.4–10.5)
Chloride: 91 mEq/L — ABNORMAL LOW (ref 96–112)
Creat: 10.03 mg/dL (ref 0.50–1.35)
Creatinine, Ser: 9.97 mg/dL — ABNORMAL HIGH (ref 0.50–1.35)
Total Bilirubin: 0.5 mg/dL (ref 0.3–1.2)
Total Bilirubin: 0.3 mg/dL (ref 0.3–1.2)
Total Protein: 6.9 g/dL (ref 6.0–8.3)

## 2013-07-04 LAB — CBC WITH DIFFERENTIAL/PLATELET
Basophils Absolute: 0 10*3/uL (ref 0.0–0.1)
Eosinophils Absolute: 0 10*3/uL (ref 0.0–0.7)
Eosinophils Relative: 0 % (ref 0–5)
Eosinophils Relative: 0 % (ref 0–5)
HCT: 40.5 % (ref 39.0–52.0)
HCT: 38.2 % — ABNORMAL LOW (ref 39.0–52.0)
Hemoglobin: 14.6 g/dL (ref 13.0–17.0)
Hemoglobin: 13.7 g/dL (ref 13.0–17.0)
Lymphocytes Relative: 18 % (ref 12–46)
Lymphocytes Relative: 18 % (ref 12–46)
Lymphs Abs: 0.7 10*3/uL (ref 0.7–4.0)
MCH: 26.6 pg (ref 26.0–34.0)
MCHC: 36 g/dL (ref 30.0–36.0)
MCV: 73.6 fL — ABNORMAL LOW (ref 78.0–100.0)
MCV: 74.2 fL — ABNORMAL LOW (ref 78.0–100.0)
Monocytes Absolute: 0.7 10*3/uL (ref 0.1–1.0)
Monocytes Absolute: 0.6 10*3/uL (ref 0.1–1.0)
Monocytes Relative: 16 % — ABNORMAL HIGH (ref 3–12)
Monocytes Relative: 17 % — ABNORMAL HIGH (ref 3–12)
Neutro Abs: 3.1 10*3/uL (ref 1.7–7.7)
RBC: 5.15 MIL/uL (ref 4.22–5.81)
RDW: 16.8 % — ABNORMAL HIGH (ref 11.5–15.5)
WBC: 4.7 10*3/uL (ref 4.0–10.5)
WBC: 3.7 10*3/uL — ABNORMAL LOW (ref 4.0–10.5)

## 2013-07-04 LAB — BASIC METABOLIC PANEL
BUN: 57 mg/dL — ABNORMAL HIGH (ref 6–23)
CO2: 17 mEq/L — ABNORMAL LOW (ref 19–32)
Calcium: 9 mg/dL (ref 8.4–10.5)
Chloride: 95 mEq/L — ABNORMAL LOW (ref 96–112)
Chloride: 97 mEq/L (ref 96–112)
GFR calc non Af Amer: 8 mL/min — ABNORMAL LOW (ref >90)
Glucose, Bld: 125 mg/dL — ABNORMAL HIGH (ref 70–99)
Glucose, Bld: 137 mg/dL — ABNORMAL HIGH (ref 70–99)
Potassium: 5.1 mEq/L (ref 3.5–5.1)
Potassium: 4.9 mEq/L (ref 3.5–5.1)
Sodium: 128 mEq/L — ABNORMAL LOW (ref 135–145)
Sodium: 130 mEq/L — ABNORMAL LOW (ref 135–145)

## 2013-07-04 LAB — URINE MICROSCOPIC-ADD ON

## 2013-07-04 LAB — GLUCOSE, CAPILLARY: Glucose-Capillary: 125 mg/dL — ABNORMAL HIGH (ref 70–99)

## 2013-07-04 MED ORDER — ONDANSETRON HCL 4 MG/2ML IJ SOLN: 4.0000 mg | Freq: Four times a day (QID) | INTRAMUSCULAR | Status: DC | PRN

## 2013-07-04 MED ORDER — HYDROCODONE-ACETAMINOPHEN 5-325 MG PO TABS: 1.0000 | ORAL_TABLET | ORAL | Status: DC | PRN

## 2013-07-04 MED ORDER — ONDANSETRON HCL 4 MG PO TABS: 4.0000 mg | ORAL_TABLET | Freq: Four times a day (QID) | ORAL | Status: DC | PRN

## 2013-07-04 MED ORDER — ABACAVIR SULFATE-LAMIVUDINE 600-300 MG PO TABS
1.0000 | ORAL_TABLET | Freq: Every day | ORAL | Status: DC
  Filled 2013-07-04: qty 1

## 2013-07-04 MED ORDER — SENNA 8.6 MG PO TABS
1.0000 | ORAL_TABLET | Freq: Two times a day (BID) | ORAL | Status: DC
  Administered 2013-07-04 – 2013-07-06 (×5): 8.6 mg via ORAL
  Filled 2013-07-04 (×7): qty 1

## 2013-07-04 MED ORDER — OMEGA-3-ACID ETHYL ESTERS 1 G PO CAPS
2.0000 g | ORAL_CAPSULE | Freq: Two times a day (BID) | ORAL | Status: DC
  Administered 2013-07-04 – 2013-07-06 (×5): 2 g via ORAL
  Filled 2013-07-04 (×2): qty 2
  Filled 2013-07-04: qty 1
  Filled 2013-07-04 (×3): qty 2

## 2013-07-04 MED ORDER — INSULIN ASPART 100 UNIT/ML ~~LOC~~ SOLN
0.0000 [IU] | Freq: Three times a day (TID) | SUBCUTANEOUS | Status: DC
  Administered 2013-07-04: 2 [IU] via SUBCUTANEOUS
  Administered 2013-07-05: 3 [IU] via SUBCUTANEOUS
  Administered 2013-07-06: 1 [IU] via SUBCUTANEOUS

## 2013-07-04 MED ORDER — SODIUM CHLORIDE 0.9 % IV SOLN
INTRAVENOUS | Status: DC
  Administered 2013-07-04: 125 mL/h via INTRAVENOUS
  Administered 2013-07-04 – 2013-07-06 (×5): via INTRAVENOUS

## 2013-07-04 MED ORDER — TRAMADOL HCL 50 MG PO TABS: 50.0000 mg | ORAL_TABLET | Freq: Two times a day (BID) | ORAL | Status: DC | PRN

## 2013-07-04 MED ORDER — ENOXAPARIN SODIUM 30 MG/0.3ML ~~LOC~~ SOLN
30.0000 mg | SUBCUTANEOUS | Status: DC
  Administered 2013-07-04 – 2013-07-05 (×2): 30 mg via SUBCUTANEOUS
  Filled 2013-07-04 (×3): qty 0.3

## 2013-07-04 MED ORDER — TAMSULOSIN HCL 0.4 MG PO CAPS: 0.4000 mg | ORAL_CAPSULE | Freq: Every day | ORAL | Status: DC

## 2013-07-04 MED ORDER — SULFAMETHOXAZOLE-TRIMETHOPRIM 400-80 MG PO TABS
1.0000 | ORAL_TABLET | Freq: Every day | ORAL | Status: DC
  Administered 2013-07-04: 1 via ORAL
  Filled 2013-07-04: qty 1

## 2013-07-04 MED ORDER — VALACYCLOVIR HCL 500 MG PO TABS
1000.0000 mg | ORAL_TABLET | Freq: Every day | ORAL | Status: DC
  Administered 2013-07-04: 1000 mg via ORAL
  Filled 2013-07-04 (×2): qty 2

## 2013-07-04 MED ORDER — CARVEDILOL 3.125 MG PO TABS
3.1250 mg | ORAL_TABLET | Freq: Two times a day (BID) | ORAL | Status: DC
  Administered 2013-07-04 – 2013-07-06 (×4): 3.125 mg via ORAL
  Filled 2013-07-04 (×7): qty 1

## 2013-07-04 MED ORDER — MORPHINE SULFATE 2 MG/ML IJ SOLN: 1.0000 mg | INTRAMUSCULAR | Status: DC | PRN

## 2013-07-04 MED ORDER — DOLUTEGRAVIR SODIUM 50 MG PO TABS
50.0000 mg | ORAL_TABLET | Freq: Every day | ORAL | Status: DC
  Filled 2013-07-04: qty 1

## 2013-07-04 NOTE — Progress Notes (Signed)
Sterile urinary catherization.  Pt c/o of small amount of pain during insertion.  700 mL amber-colored urine removed.  Small amount of blood when catheter removed. Pt tolerated procedure and is now comfortable.

## 2013-07-04 NOTE — Progress Notes (Signed)
Peripheral IV attempt left forearm with 22 gauge 1 inch.  Area cleansed with chlorhexidine.   Site flushed well and later infiltrated.   IV removed and dressing applied.   Second attempt.   Peripheral IV started right forearm 22 gauge 1 inch.  Area cleansed with chlorhexidine .  Site flushed well. Patient tolerated the procedure well.  Normal saline started at bolus rate.    Site has good placement.   Laurell Josephs, RN

## 2013-07-04 NOTE — Progress Notes (Signed)
Foley placed d/t urinary retention

## 2013-07-04 NOTE — Progress Notes (Addendum)
[facility]  RFV: sick visit Subjective:    Patient ID: Jeremy Reeves, male    DOB: 07-21-1941, 72 y.o.   MRN: [id number]  HPI 72yo M with newly HIV diagnosed, CD 4 count 180/VL54,000 recently started on tivicay/epzicom < 1 month ago. HIV complicated with recent zoster affecting right foot, who has recently finished valtrex in the last 2-3 days. States vesicles have healed and pain improved where he stopped taking oxycodone on Sunday. Felt poorly on Monday due to poor sleep since had frequent urination, which has persisted for roughly 7-10days. His frequent urination has improved since taking flomax however, now he reports that he has not urinated in greater than 15hrs. He has also noticed feeling chills/NS/ but afebrile and also gets nausea and vomiting started yesterday. Concern for pain medication withdrawal. He feels dizzy, lightheadedness. Has poor appetite, and has not had enough fluids per wife. He last urinated sometime yesterday  Poor appetite, and only had 8 oz of water today. Did not take medications this morning.Takes flomax, cardiac, and hiv meds in the morning usually  Current Outpatient Prescriptions on File Prior to Visit  Medication Sig Dispense Refill  . abacavir-lamiVUDine (EPZICOM) 600-300 MG per tablet Take 1 tablet by mouth daily.  30 tablet  11  . aspirin 81 MG tablet Take 81 mg by mouth daily.      Marland Kitchen atorvastatin (LIPITOR) 80 MG tablet Take 1 tablet (80 mg total) by mouth daily.  90 tablet  3  . carvedilol (COREG) 3.125 MG tablet Take 1 tablet (3.125 mg total) by mouth 2 (two) times daily with a meal.  180 tablet  3  . dolutegravir (TIVICAY) 50 MG tablet Take 1 tablet (50 mg total) by mouth daily.  30 tablet  11  . glucose blood test strip This is for contour meter  100 each  prn  . Lancets MISC This is for the contour  100 each  prn  . Multiple Vitamins-Minerals (MULTIVITAMIN WITH MINERALS) tablet Take 1 tablet by mouth daily.        . niacin (NIASPAN) 1000 MG  CR tablet Take 2 tablets (2,000 mg total) by mouth at bedtime.  180 tablet  3  . omega-3 acid ethyl esters (LOVAZA) 1 G capsule Take 2 capsules (2 g total) by mouth 2 (two) times daily.  360 capsule  3  . omeprazole (PRILOSEC) 20 MG capsule Take 20 mg by mouth daily.      Marland Kitchen sulfamethoxazole-trimethoprim (SEPTRA) 400-80 MG per tablet Take 1 tablet by mouth daily.  30 tablet  3  . tamsulosin (FLOMAX) 0.4 MG CAPS capsule Take 1 capsule (0.4 mg total) by mouth daily.  30 capsule  3  . vitamin E 400 UNIT capsule Take 400 Units by mouth daily.        . traMADol (ULTRAM) 50 MG tablet Take 1 tablet (50 mg total) by mouth every 12 (twelve) hours as needed for pain.  30 tablet  0   No current facility-administered medications on file prior to visit.     Review of Systems + pertinents listed in hpi but he also subscribes to visual hallucinations which he sees with eyes open and closed?    Objective:   Physical Exam BP 145/89  Pulse 97  Temp(Src) 98 F (36.7 C) (Oral) Physical Exam  Constitutional: He is oriented to person, place, and time. He appears ill-appearing. No distress.  HENT:  Mouth/Throat: Oropharynx is clear and moist. No oropharyngeal exudate.  Cardiovascular:  Normal rate, regular rhythm and normal heart sounds. Exam reveals no gallop and no friction rub.  No murmur heard.  Pulmonary/Chest: Effort normal and breath sounds normal. No respiratory distress. He has no wheezes.  Abdominal: Soft. Bowel sounds are normal. He exhibits no distension. There is no tenderness.  Lymphadenopathy:  He has no cervical adenopathy.  Neurological: He is alert and oriented to person, place, and time. Unsteady gait while ambulating Skin: Skin is warm and dry. Zoster rash is healed from right foot Psychiatric: He has a normal mood and affect. His behavior is normal.        Assessment & Plan:  Urinary retention = will place foley to see if it improves his symptoms. RN placed foley with amber  colored urine decompresed bladder. Sent ua and repeat urine culture  N/v/d = could be due to some element of pain medication drug withdrawal but concern that he may have underlying infection contributing to his presentation or that it is symptoms associated with renal failure. Will get cmp  HIV = currently on tivicay/epzicom  oi proph = currently bactrim for pcp proph; will check Cr to see if need to hold and change to another agent  Zoster = lesions healed, less pain. Finished valtrex. Now will do proph with valtrex 1gm daily  Possible new infection = will get labs and ua and urine cx, cbc and cmp  Dizziness/hypotension = will check orthostatics to see if this is contributing to his symptoms. + orthostatics from 154/93 sitting to 125/85 standing. Giving 1L NS in clinic for orthostatic hypotension plan for admission to hospital  Visual hallucinations = possible due to poor sleep over the last few days. First we will address problems above  Patient has numerous issues to address, best feel to have him admitted to the hospital for further evaluation and management of his multiple problems   Spoke with Dr. David Stall who will be admitting him.  Addendum: labs from clinic shows metabolic disarray, acute renal failure with Cr 10. His sodium of 122 which would also explain dizziness. Will follow as he is admitted,  Spent greater than 60 min with patinet 50% in direct patient care 50% coordination of care

## 2013-07-04 NOTE — H&P (Addendum)
[facility] History and Physical  Jeremy Reeves XBJ:[id number] DOB: [birth year] DOA: 07/04/2013  Referring physician: Dr Drue Second.  PCP: Carollee Herter, MD  Specialists: Dr Drue Second.   Chief Complaint: nausea, vomiting, generalized weakness.   HPI: Jeremy Reeves is a 72 y.o. male with PMH significant for Diabetes, HTN, BPH, recently diagnosed with HIV last CD 4 count at 70, shingle of  right foot, finished treatment with Valtrex who was refer for direct admission by Dr Drue Second for further evaluation of nausea, vomiting, hypotension. Patient relates feeling weak all over. He vomit twice day prior to admission. He has been constipated for last 4 days until this morning when he had a BM. He relates chills. He was found to have 700 cc urine retention today. He had In and out cath perform  at Dr Drue Second office. He notice a little of blood in his urine today after in and out catheter. He relates visual hallucinations, dots and spots. No headaches, cough, abdominal pain. He is complaining of chronic back pain.   He was found to be tachycardic and hypotensive at Dr snider office. This improved after bolus of IV fluids.   Review of Systems: Negative except as per HPI.   Past Medical History  Diagnosis Date  . ASHD (arteriosclerotic heart disease)   . Diabetes mellitus   . Hyperlipidemia   . BPH (benign prostatic hyperplasia)   . Hiatal hernia   . HH (hiatus hernia)    Past Surgical History  Procedure Laterality Date  . Colonoscopy  2004  . Coronary artery bypass graft  2001   Social History:  reports that he quit smoking about 15 years ago. He has never used smokeless tobacco. He reports that he does not drink alcohol or use illicit drugs.   Allergies  Allergen Reactions  . Peanut-Containing Drug Products Swelling   Family History: parents deceased. Mother had history of kidney problems.   Prior to Admission medications   Medication Sig Start Date End Date Taking?  Authorizing Provider  abacavir-lamiVUDine (EPZICOM) 600-300 MG per tablet Take 1 tablet by mouth daily. 06/20/13  Yes Judyann Munson, MD  aspirin 81 MG tablet Take 81 mg by mouth daily.   Yes Historical Provider, MD  atorvastatin (LIPITOR) 80 MG tablet Take 1 tablet (80 mg total) by mouth daily. 05/08/13  Yes Runell Gess, MD  carvedilol (COREG) 3.125 MG tablet Take 1 tablet (3.125 mg total) by mouth 2 (two) times daily with a meal. 05/08/13  Yes Runell Gess, MD  Cinnamon 500 MG capsule Take 1,000 mg by mouth daily.   Yes Historical Provider, MD  dolutegravir (TIVICAY) 50 MG tablet Take 1 tablet (50 mg total) by mouth daily. 06/20/13  Yes Judyann Munson, MD  glucose blood test strip This is for contour meter 06/30/12  Yes Ronnald Nian, MD  Lancets MISC This is for the contour 06/30/12  Yes Ronnald Nian, MD  Multiple Vitamins-Minerals (MULTIVITAMIN WITH MINERALS) tablet Take 1 tablet by mouth daily.     Yes Historical Provider, MD  niacin (NIASPAN) 1000 MG CR tablet Take 2 tablets (2,000 mg total) by mouth at bedtime. 05/08/13  Yes Runell Gess, MD  omega-3 acid ethyl esters (LOVAZA) 1 G capsule Take 2 capsules (2 g total) by mouth 2 (two) times daily. 05/10/13  Yes Ronnald Nian, MD  omeprazole (PRILOSEC) 20 MG capsule Take 20 mg by mouth daily.   Yes Historical Provider, MD  sulfamethoxazole-trimethoprim (SEPTRA) 400-80 MG per tablet  Take 1 tablet by mouth daily. 06/08/13  Yes Judyann Munson, MD  tamsulosin (FLOMAX) 0.4 MG CAPS capsule Take 1 capsule (0.4 mg total) by mouth daily. 06/25/13  Yes Kermit Balo Tysinger, PA-C  vitamin E 400 UNIT capsule Take 400 Units by mouth daily.     Yes Historical Provider, MD   Physical Exam: Filed Vitals:   07/04/13 1338  BP: 155/81  Pulse: 104  Temp: 97.8 F (36.6 C)  Resp: 20   General Appearance:    Alert, cooperative, no distress, appears stated age  Head:    Normocephalic, without obvious abnormality, atraumatic  Eyes:    PERRL,  conjunctiva/corneas clear, EOM's intact,         Ears:    Normal TM's and external ear canals, both ears  Nose:   Nares normal, septum midline, mucosa normal, no drainage    or sinus tenderness  Throat:   Lips, mucosa, and tongue normal; teeth and gums normal  Neck:   Supple, symmetrical, trachea midline, no adenopathy;       thyroid:  No enlargement/tenderness/nodules; no carotid   bruit or JVD  Back:     Symmetric, no curvature, ROM normal, no CVA tenderness  Lungs:     Clear to auscultation bilaterally, respirations unlabored  Chest wall:    No tenderness or deformity  Heart:    Regular rate and rhythm, S1 and S2 normal, no murmur, rub   or gallop  Abdomen:     Soft, non-tender, bowel sounds active all four quadrants,    no masses, no organomegaly        Extremities:   Extremities normal, atraumatic, no cyanosis or edema  Pulses:   2+ and symmetric all extremities  Skin:   Multiple dry lesion right foot, shingle healing. ,   Lymph nodes:   Cervical, supraclavicular, and axillary nodes normal  Neurologic:   CNII-XII intact. Normal strength, sensation and reflexes      throughout      Labs on Admission:  Basic Metabolic Panel: No results found for this basename: NA, K, CL, CO2, GLUCOSE, BUN, CREATININE, CALCIUM, MG, PHOS,  in the last 168 hours Liver Function Tests: No results found for this basename: AST, ALT, ALKPHOS, BILITOT, PROT, ALBUMIN,  in the last 168 hours No results found for this basename: LIPASE, AMYLASE,  in the last 168 hours No results found for this basename: AMMONIA,  in the last 168 hours CBC: No results found for this basename: WBC, NEUTROABS, HGB, HCT, MCV, PLT,  in the last 168 hours Cardiac Enzymes: No results found for this basename: CKTOTAL, CKMB, CKMBINDEX, TROPONINI,  in the last 168 hours  BNP (last 3 results) No results found for this basename: PROBNP,  in the last 8760 hours CBG: No results found for this basename: GLUCAP,  in the last 168  hours  Radiological Exams on Admission: No results found.   EKG: ordered.   Assessment/Plan Active Problems:   BPH (benign prostatic hyperplasia)   Diabetes mellitus   Hypertension associated with diabetes   HIV disease   Nausea with vomiting   Hypotension, unspecified  1-Hypotension: probably secondary to decrease volume, orthostatic positive. Poor oral intake. Will resume coreg to avoid rebound tachycardia. Holder parameter for coreg. Less likely secondary to infection. Check ua. Continue with IV fluids.   2-Nausea, Vomiting: this could be adverse effect from medications, withdrawal. Will check KUB rule out ileus, obstruction. Check lipase. IV fluids, clear diet. Check UA rule out infections. Zofran  PRN.    3-Urine rentention: could be secondary to BPH.  continue with flomax. Will check for infection. UA ordered.   4-Generalized weakness; secondary to acute illness, hypotension. Will need pt evaluation.  5-HIV: continue with current medications.  6-Shingle: continue with valtrex 1 gram daily.  7-Hallucination: Check CT head.   Patient presentation could be related to adverse effect from medications or drug withdrawl. Will need to follow ID recommendation regarding medications for HIV.   Labs pending.   Code Status: presume Full Code.  Family Communication: Care discussed with wife who was at bedside.  Disposition Plan: expect 2 to 3 days depending on medical condition improvement.   Time spent: 75 minutes.   Jove Beyl [facility] Pager [phone]  If 7PM-7AM, please contact night-coverage [email or web address] Password [facility] 07/04/2013, 2:09 PM  Addendum:   Patient multiples symptoms explains by electrolytes abnormalities, acute renal failure.   1- Acute renal failure: Will hold Bactrim, HIV medications. Will order renal US. Increase IV fluids.   2-Metabolic acidosis: Continue with IV fluids. Likely related to renal failure. No more hypotension episodes. Less  likely sepsis.   3-Hyponatremia: IV fluids. Repeat B-met every 3 hours. Does not correct sodium level more than 9 meq in 24 hour period.

## 2013-07-04 NOTE — Telephone Encounter (Signed)
Called by [facility] labs around 9-10 PM regarding pts labs.  Creatinine 10.03 07/04/13 from 12:30 PM labs then 9.97 and 7.75.  Pt is already hospitalized being followed by hospitalists team.  Informed RN to contact primary team during this admission ASAP.    Shirlee Latch MD

## 2013-07-04 NOTE — Progress Notes (Signed)
Patient Active Problem List   Diagnosis Date Noted  . HIV disease 05/24/2013  . Hypertension associated with diabetes 01/25/2013  . ASHD (arteriosclerotic heart disease) 03/18/2011  . BPH (benign prostatic hyperplasia) 03/18/2011  . Hyperlipidemia LDL goal <70 03/18/2011  . Diabetes mellitus 03/18/2011   72 year old male with past medical history of HIV diagnosed about a month ago and started on HAART therapy, also he had shingles in about a month ago, he stopped his narcotics and she has been taken for the past 15 days about 4 days prior to admission, he went to her infectious disease doctor who found him to be with  Urinary retention and mild orthostatic hypotension which improved after a year of IV fluid,foley  inserted and 700 cc came out. - Nausea and vomiting = could be due to some element of pain medication drug withdrawal, vs decrease intravascular vol due tobut concern that he may have underlying infection contributing to his presentation.   - Was admitted to regular floor as he was not tachycardic, blood pressure improved after IV fluids he was coherent but kept complaining of being tired so we were asked to admit and further evaluate.

## 2013-07-05 DIAGNOSIS — I10 Essential (primary) hypertension: Secondary | ICD-10-CM

## 2013-07-05 DIAGNOSIS — N179 Acute kidney failure, unspecified: Secondary | ICD-10-CM | POA: Diagnosis not present

## 2013-07-05 DIAGNOSIS — K219 Gastro-esophageal reflux disease without esophagitis: Secondary | ICD-10-CM | POA: Diagnosis not present

## 2013-07-05 DIAGNOSIS — B20 Human immunodeficiency virus [HIV] disease: Secondary | ICD-10-CM | POA: Diagnosis not present

## 2013-07-05 DIAGNOSIS — B029 Zoster without complications: Secondary | ICD-10-CM | POA: Diagnosis present

## 2013-07-05 DIAGNOSIS — E119 Type 2 diabetes mellitus without complications: Secondary | ICD-10-CM | POA: Diagnosis not present

## 2013-07-05 DIAGNOSIS — E1169 Type 2 diabetes mellitus with other specified complication: Secondary | ICD-10-CM

## 2013-07-05 DIAGNOSIS — N139 Obstructive and reflux uropathy, unspecified: Secondary | ICD-10-CM | POA: Diagnosis present

## 2013-07-05 DIAGNOSIS — I959 Hypotension, unspecified: Secondary | ICD-10-CM

## 2013-07-05 DIAGNOSIS — E785 Hyperlipidemia, unspecified: Secondary | ICD-10-CM

## 2013-07-05 LAB — CREATININE, URINE, RANDOM: Creatinine, Urine: 159.09 mg/dL

## 2013-07-05 LAB — BASIC METABOLIC PANEL
BUN: 47 mg/dL — ABNORMAL HIGH (ref 6–23)
CO2: 19 mEq/L (ref 19–32)
CO2: 19 mEq/L (ref 19–32)
Calcium: 9.6 mg/dL (ref 8.4–10.5)
Chloride: 99 mEq/L (ref 96–112)
Chloride: 100 mEq/L (ref 96–112)
Creatinine, Ser: 4.26 mg/dL — ABNORMAL HIGH (ref 0.50–1.35)
Creatinine, Ser: 3.29 mg/dL — ABNORMAL HIGH (ref 0.50–1.35)
GFR calc Af Amer: 12 mL/min — ABNORMAL LOW (ref >90)
GFR calc Af Amer: 20 mL/min — ABNORMAL LOW (ref >90)
GFR calc non Af Amer: 18 mL/min — ABNORMAL LOW (ref >90)
Glucose, Bld: 112 mg/dL — ABNORMAL HIGH (ref 70–99)
Potassium: 4.8 mEq/L (ref 3.5–5.1)
Sodium: 134 mEq/L — ABNORMAL LOW (ref 135–145)

## 2013-07-05 LAB — GLUCOSE, CAPILLARY
Glucose-Capillary: 109 mg/dL — ABNORMAL HIGH (ref 70–99)
Glucose-Capillary: 115 mg/dL — ABNORMAL HIGH (ref 70–99)

## 2013-07-05 LAB — URINALYSIS, MICROSCOPIC ONLY
Bacteria, UA: NONE SEEN
Casts: NONE SEEN
Crystals: NONE SEEN
Squamous Epithelial / LPF: NONE SEEN

## 2013-07-05 LAB — CBC
HCT: 38.5 % — ABNORMAL LOW (ref 39.0–52.0)
MCH: 26.8 pg (ref 26.0–34.0)
MCV: 74.2 fL — ABNORMAL LOW (ref 78.0–100.0)
RBC: 5.19 MIL/uL (ref 4.22–5.81)
WBC: 3.8 10*3/uL — ABNORMAL LOW (ref 4.0–10.5)

## 2013-07-05 LAB — SODIUM, URINE, RANDOM: Sodium, Ur: 58 mEq/L

## 2013-07-05 LAB — HEMOGLOBIN A1C
Hgb A1c MFr Bld: 6.9 % — ABNORMAL HIGH (ref <5.7)
Mean Plasma Glucose: 151 mg/dL — ABNORMAL HIGH (ref <117)

## 2013-07-05 MED ORDER — VALACYCLOVIR HCL 500 MG PO TABS
500.0000 mg | ORAL_TABLET | Freq: Every day | ORAL | Status: DC
  Administered 2013-07-05 – 2013-07-06 (×2): 500 mg via ORAL
  Filled 2013-07-05 (×2): qty 1

## 2013-07-05 MED ORDER — NIACIN ER (ANTIHYPERLIPIDEMIC) 1000 MG PO TBCR: 2000.0000 mg | EXTENDED_RELEASE_TABLET | Freq: Every day | ORAL | Status: DC

## 2013-07-05 MED ORDER — ATORVASTATIN CALCIUM 80 MG PO TABS
80.0000 mg | ORAL_TABLET | Freq: Every day | ORAL | Status: DC
  Administered 2013-07-05: 80 mg via ORAL
  Filled 2013-07-05 (×2): qty 1

## 2013-07-05 MED ORDER — LAMIVUDINE 10 MG/ML PO SOLN
100.0000 mg | Freq: Every day | ORAL | Status: DC
  Administered 2013-07-05 – 2013-07-06 (×2): 100 mg via ORAL
  Filled 2013-07-05 (×2): qty 10

## 2013-07-05 MED ORDER — NIACIN ER (ANTIHYPERLIPIDEMIC) 1000 MG PO TBCR
2000.0000 mg | EXTENDED_RELEASE_TABLET | Freq: Every day | ORAL | Status: DC
  Administered 2013-07-05: 2000 mg via ORAL
  Filled 2013-07-05 (×2): qty 2

## 2013-07-05 MED ORDER — TAMSULOSIN HCL 0.4 MG PO CAPS
0.4000 mg | ORAL_CAPSULE | Freq: Every day | ORAL | Status: DC
  Administered 2013-07-05 – 2013-07-06 (×2): 0.4 mg via ORAL
  Filled 2013-07-05 (×2): qty 1

## 2013-07-05 MED ORDER — ASPIRIN 81 MG PO TABS: 81.0000 mg | ORAL_TABLET | Freq: Every day | ORAL | Status: DC

## 2013-07-05 MED ORDER — ASPIRIN 81 MG PO CHEW
81.0000 mg | CHEWABLE_TABLET | Freq: Every day | ORAL | Status: DC
  Administered 2013-07-05 – 2013-07-06 (×2): 81 mg via ORAL
  Filled 2013-07-05 (×2): qty 1

## 2013-07-05 MED ORDER — DOLUTEGRAVIR SODIUM 50 MG PO TABS
50.0000 mg | ORAL_TABLET | Freq: Every day | ORAL | Status: DC
  Administered 2013-07-05 – 2013-07-06 (×2): 50 mg via ORAL
  Filled 2013-07-05 (×2): qty 1

## 2013-07-05 MED ORDER — PANTOPRAZOLE SODIUM 40 MG PO TBEC
40.0000 mg | DELAYED_RELEASE_TABLET | Freq: Every day | ORAL | Status: DC
  Administered 2013-07-05 – 2013-07-06 (×2): 40 mg via ORAL
  Filled 2013-07-05 (×2): qty 1

## 2013-07-05 MED ORDER — ABACAVIR SULFATE 300 MG PO TABS
300.0000 mg | ORAL_TABLET | Freq: Every day | ORAL | Status: DC
  Administered 2013-07-05 – 2013-07-06 (×2): 300 mg via ORAL
  Filled 2013-07-05 (×2): qty 1

## 2013-07-05 NOTE — Progress Notes (Signed)
Utilization Review Completed.   Kairah Leoni, RN, BSN Nurse Case Manager  [phone]  

## 2013-07-05 NOTE — Progress Notes (Signed)
Pharmacy: Anti-retroviral Adjustment for Renal Dysfunction  OBJECTIVE:  SCr 3.29, CrCl~20-25 AST/ALT: 29/23  ASSESSMENT:  72 y.o. M recently diagnosed with HIV who presented with N/V and generalized weakness and was found to have acute renal failure and metabolic acidosis. HIV meds were held initially -- but are now to be resumed and renally adjusted.  Per discussion with Dr. Lendell Caprice, will not restart Bactrim at this time due to the patient's acute renal failure. Alternatives for PCP prophylaxis that are safe with reduced renal function and do not require adjustment are Atovaquone and Dapsone. Atovaquone is dosed 1500 mg once daily -- however this could cause more GI upset, so would likely need to be started once the patient improved. The alternative, Dapsone is dosed at 100 mg daily -- however this requires G6PD deficiency to be checked prior to initiation. These are both options that can be considered as the patient improves -- however ID will likely address this.  PLAN:  1. Will adjust Lamivudine to 100 mg daily 2. Will continue Abacavir 300 mg daily and Dolutegravir 50 mg daily (no renal adjustment needed) 3. Consider an alternative for PCP prophylaxis when the patient is more stable 4. Pharmacy will continue to monitor for any necessary dose adjustments  Georgina Pillion, PharmD, BCPS Clinical Pharmacist Pager: [phone] 07/05/2013 2:13 PM

## 2013-07-05 NOTE — Progress Notes (Signed)
Noted that pt. Has BUN of 57 and Creatinine of 6.49.  Hospital staff PA [facility] called and informed of information. No new orders given at this time.  Lab work actually coming down from admission.  Jonetta Speak, RN, CM

## 2013-07-05 NOTE — Progress Notes (Signed)
Chart reviewed.  [facility] PROGRESS NOTE  Jeremy CHANCELLOR WUJ:[id number] DOB: 1941/10/16 DOA: 07/04/2013 PCP: Carollee Herter, MD  Assessment/Plan:    Acute renal failure: secondary to postobstructive uropathy and prerenal component:  Improving.  Continue flomax.  Home with foley, and outpatient f/u with urology. Improving. Home tomorrow if continues to improve.  Hold bactrim.  Resume antiretrovirals at renal dose.  Discussed with pharmacy.    BPH (benign prostatic hyperplasia):   Obstructive uropathy   Diabetes mellitus: not on meds. Cont SSI. Check hgb A1C   Hypertension associated with diabetes   HIV disease   Nausea with vomiting resolved   Hypotension, unspecified resolved   Hyponatremia resolved   GERD (gastroesophageal reflux disease)   Herpes zoster resolving  Consultants:  none  Procedures:  none  Antibiotics:  none  HPI/Subjective: "Can I go home? I feel great!" no N/V, no cough, no dizziness.  Objective: Filed Vitals:   07/05/13 0532  BP: 124/76  Pulse: 112  Temp: 97.9 F (36.6 C)  Resp: 18    Intake/Output Summary (Last 24 hours) at 07/05/13 1005 Last data filed at 07/05/13 0535  Gross per 24 hour  Intake      0 ml  Output   8950 ml  Net  -8950 ml   Filed Weights   07/04/13 1338 07/05/13 0532  Weight: 84.2 kg (185 lb 10 oz) 84.2 kg (185 lb 10 oz)    Exam:   General:  Nontoxic. Comfortable. Alert and oriented  HEENT: MMM  Cardiovascular: RRR without WRR  Respiratory: CTA without WRR  Abdomen: S, NT, ND  Ext: right foot and ankle with crusted vessicles and pedal edema.  Left leg without edema  Data Reviewed: Basic Metabolic Panel:  Recent Labs Lab 07/04/13 1517 07/04/13 2045 07/04/13 2300 07/05/13 0211 07/05/13 0510  NA 118* 128* 130* 132* 134*  K 5.1 5.1 4.9 4.8 4.9  CL 85* 95* 97 99 100  CO2 12* 17* 17* 19 19  GLUCOSE 175* 125* 137* 122* 112*  BUN 69* 62* 57* 47* 40*  CREATININE 9.97* 7.75* 6.49* 5.24*  4.26*  CALCIUM 8.8 9.0 9.2 9.0 9.4   Liver Function Tests:  Recent Labs Lab 07/04/13 1230 07/04/13 1517  AST 31 29  ALT 28 23  ALKPHOS 36* 35*  BILITOT 0.5 0.3  PROT 7.5 6.9  ALBUMIN 3.8 2.9*    Recent Labs Lab 07/04/13 1751  LIPASE 49   No results found for this basename: AMMONIA,  in the last 168 hours CBC:  Recent Labs Lab 07/04/13 1230 07/04/13 1517 07/05/13 0510  WBC 4.7 3.7* 3.8*  NEUTROABS 3.1 2.4  --   HGB 14.6 13.7 13.9  HCT 40.5 38.2* 38.5*  MCV 73.6* 74.2* 74.2*  PLT 142* 125* 132*   Cardiac Enzymes: No results found for this basename: CKTOTAL, CKMB, CKMBINDEX, TROPONINI,  in the last 168 hours BNP (last 3 results) No results found for this basename: PROBNP,  in the last 8760 hours CBG:  Recent Labs Lab 07/04/13 1458 07/04/13 1648 07/04/13 2143 07/05/13 0931  GLUCAP 287* 186* 125* 208*   Studies: Dg Abd 1 View  07/05/2013   *RADIOLOGY REPORT*  Clinical Data: Nausea.  Vomiting yesterday.  ABDOMEN - 1 VIEW  Comparison: None.  Findings: The bowel gas pattern is nonobstructive.  No gross plain film evidence of free air.  Stool and bowel gas extends to the rectosigmoid.  Increased densities at L5-S1 suggesting prior posterior lumbar fusion. Calcification projects over the left  mid abdomen, likely representing an 9 mm renal collecting system calculus which was also seen on prior lumbar spine radiograph.  No ureteral calculi identified.  Atherosclerosis.  IMPRESSION: Normal bowel gas pattern.  9 mm left interpolar renal collecting system calculus.   Original Report Authenticated By: Andreas Newport, M.D.   Ct Head Wo Contrast  07/04/2013   CLINICAL DATA:  72 year old male with visual hallucinations.  EXAM: CT HEAD WITHOUT CONTRAST  TECHNIQUE: Contiguous axial images were obtained from the base of the skull through the vertex without intravenous contrast.  COMPARISON:  01/29/2011.  FINDINGS: Visualized paranasal sinuses and mastoids are clear. Stable and  negative non contrast CT appearance of the orbits. Visualized scalp soft tissues are within normal limits. No acute osseous abnormality identified.  Calcified atherosclerosis at the skull base. Cerebral volume is not significantly changed. Chronic dural calcifications. No ventriculomegaly. No midline shift, mass effect, or evidence of intracranial mass lesion. No evidence of cortically based acute infarction identified. No acute intracranial hemorrhage identified. No suspicious intracranial vascular hyperdensity.  IMPRESSION: Stable and normal for age non contrast CT appearance of the brain.   Electronically Signed   By: Augusto Gamble M.D.   On: 07/04/2013 19:48   US Renal  07/05/2013   CLINICAL DATA:  Acute renal failure  EXAM: RENAL/URINARY TRACT ULTRASOUND COMPLETE  COMPARISON:  10/04/2006  FINDINGS: Right Kidney  Length: Measures 12 cm. Echogenicity diffusely increased. There is fullness of the collecting system, particularly the upper pole. Tiny cyst in the upper pole, measuring 7 mm. Scant perinephric edema, possibly associated with the acute renal failure.  Left Kidney  Length: Measures 12 cm. Diffuse increased echogenicity. There is a lower pole cyst measuring 1.8 cm. Scant perinephric edema, possibly associated with the acute renal failure. 8mm echogenic focus in the interpolar region, without shadowing or ring down artifact.  Bladder:  Completely decompressed around the Foley catheter.  Other: The 18 mm ovoid hyperechoic lesion within the upper liver, likely right lobe.  IMPRESSION: 1. Mildly echogenic kidneys suggesting an background medical renal disease. 2. Fullness of the bilateral collecting systems, but doubt urinary obstruction. Followup could ensure normalization. 3. Probable 8mm calculus in the interpolar left kidney, also seen in 2007. 4. Nonspecific 1.8 cm echogenic lesion in the right lobe liver. Recommend outpatient MRI, especially if there is history of chronic liver disease.   Electronically  Signed   By: Tiburcio Pea   On: 07/05/2013 02:39    Scheduled Meds: . carvedilol  3.125 mg Oral BID WC  . enoxaparin (LOVENOX) injection  30 mg Subcutaneous Q24H  . insulin aspart  0-9 Units Subcutaneous TID WC  . omega-3 acid ethyl esters  2 g Oral BID  . senna  1 tablet Oral BID  . valACYclovir  500 mg Oral Daily   Continuous Infusions: . sodium chloride 125 mL/hr at 07/05/13 0224   Time spent: 35 Genene Kilman Blasing L  [facility] Pager [phone]. If 7PM-7AM, please contact night-coverage at [email or web address], password [facility] 07/05/2013, 10:05 AM  LOS: 1 day

## 2013-07-06 DIAGNOSIS — B20 Human immunodeficiency virus [HIV] disease: Secondary | ICD-10-CM

## 2013-07-06 DIAGNOSIS — N139 Obstructive and reflux uropathy, unspecified: Secondary | ICD-10-CM

## 2013-07-06 DIAGNOSIS — N179 Acute kidney failure, unspecified: Principal | ICD-10-CM

## 2013-07-06 LAB — GLUCOSE, CAPILLARY
Glucose-Capillary: 139 mg/dL — ABNORMAL HIGH (ref 70–99)
Glucose-Capillary: 101 mg/dL — ABNORMAL HIGH (ref 70–99)

## 2013-07-06 LAB — BASIC METABOLIC PANEL
BUN: 14 mg/dL (ref 6–23)
Chloride: 103 mEq/L (ref 96–112)
GFR calc Af Amer: 50 mL/min — ABNORMAL LOW (ref >90)
GFR calc non Af Amer: 43 mL/min — ABNORMAL LOW (ref >90)
Potassium: 4.2 mEq/L (ref 3.5–5.1)
Sodium: 136 mEq/L (ref 135–145)

## 2013-07-06 MED ORDER — TAMSULOSIN HCL 0.4 MG PO CAPS: 0.4000 mg | ORAL_CAPSULE | Freq: Every day | ORAL | Status: DC

## 2013-07-06 NOTE — Discharge Summary (Signed)
Physician Discharge Summary  SAMUAL Reeves RUE:[id number] DOB: July 10, 1941 DOA: 07/04/2013  PCP: Carollee Herter, MD  Admit date: 07/04/2013 Discharge date: 07/06/2013  Time spent: 35 minutes  Recommendations for Outpatient Follow-up:  1. Follow up with Urology next week.  2. Follow up with PCP and ID next week.   Recommendations for primary care physician for things to follow:  Repeat BMP  Discharge Diagnoses:  Principal Problem:   Acute renal failure Active Problems:   BPH (benign prostatic hyperplasia)   Diabetes mellitus   Hypertension associated with diabetes   HIV disease   Nausea with vomiting   Hypotension, unspecified   Hyponatremia   Obstructive uropathy   GERD (gastroesophageal reflux disease)   Herpes zoster   Discharge Condition: stable  Diet recommendation: heart healthy  Filed Weights   07/04/13 1338 07/05/13 0532  Weight: 84.2 kg (185 lb 10 oz) 84.2 kg (185 lb 10 oz)   History of present illness:  Jeremy Reeves is a 72 y.o. male with PMH significant for Diabetes, HTN, BPH, recently diagnosed with HIV last CD 4 count at 70, shingle of right foot, finished treatment with Valtrex who was refer for direct admission by Dr Drue Second for further evaluation of nausea, vomiting, hypotension. Patient relates feeling weak all over. He vomit twice day prior to admission. He has been constipated for last 4 days until this morning when he had a BM. He relates chills. He was found to have 700 cc urine retention today. He had In and out cath perform at Dr Drue Second office. He notice a little of blood in his urine today after in and out catheter. He relates visual hallucinations, dots and spots. No headaches, cough, abdominal pain. He is complaining of chronic back pain.   Hospital Course:  Acute renal failure - due to obstructive nephropathy. S/p Foley catheter with excellent urine output. Patient's renal function improved to a Cr of 1.5 on discharge. Patient was  established an appointment with Urology in 5 days. Hypotension: probably secondary to decrease volume, orthostatic positive. Resolved with hydration.  Nausea, Vomiting: resolved HIV: continue with current medications. As renal function improved significantly on discharge, I discussed case with pharmacy and will resume his previous home medications and at his previous doses. Shingles: s/p valtrex treatment. Will follow up with ID next week.  Procedures:  none   Consultations:  none  Discharge Exam: Filed Vitals:   07/05/13 1007 07/05/13 1435 07/05/13 2150 07/06/13 0612  BP: 103/55 134/77 136/78 145/78  Pulse: 84 96 98 93  Temp: 98.4 F (36.9 C) 97.9 F (36.6 C) 98.7 F (37.1 C) 98.6 F (37 C)  TempSrc: Oral Oral Oral Oral  Resp: 18 18 20 18   Height:      Weight:      SpO2: 95% 100% 100% 100%    General: NAD Cardiovascular: RRR Respiratory: CTA biL  Discharge Instructions   Future Appointments Provider Department Dept Phone   07/19/2013 9:30 AM Judyann Munson, MD [facility] [phone]       Medication List         abacavir-lamiVUDine 600-300 MG per tablet  Commonly known as:  EPZICOM  Take 1 tablet by mouth daily.     aspirin 81 MG tablet  Take 81 mg by mouth daily.     atorvastatin 80 MG tablet  Commonly known as:  LIPITOR  Take 1 tablet (80 mg total) by mouth daily.     carvedilol 3.125 MG tablet  Commonly known as:  COREG  Take 1 tablet (3.125 mg total) by mouth 2 (two) times daily with a meal.     Cinnamon 500 MG capsule  Take 1,000 mg by mouth daily.     dolutegravir 50 MG tablet  Commonly known as:  TIVICAY  Take 1 tablet (50 mg total) by mouth daily.     glucose blood test strip  This is for contour meter     Lancets Misc  This is for the contour     multivitamin with minerals tablet  Take 1 tablet by mouth daily.     niacin 1000 MG CR tablet  Commonly known as:  NIASPAN  Take 2 tablets (2,000  mg total) by mouth at bedtime.     omega-3 acid ethyl esters 1 G capsule  Commonly known as:  LOVAZA  Take 2 capsules (2 g total) by mouth 2 (two) times daily.     omeprazole 20 MG capsule  Commonly known as:  PRILOSEC  Take 20 mg by mouth daily.     sulfamethoxazole-trimethoprim 400-80 MG per tablet  Commonly known as:  SEPTRA  Take 1 tablet by mouth daily.     tamsulosin 0.4 MG Caps capsule  Commonly known as:  FLOMAX  Take 1 capsule (0.4 mg total) by mouth daily.     vitamin E 400 UNIT capsule  Take 400 Units by mouth daily.           Follow-up Information   Follow up with [facility]. Schedule an appointment as soon as possible for a visit in 1 week.   Contact information:   [address] [place] [address] [phone]      Follow up with Carollee Herter, MD. Schedule an appointment as soon as possible for a visit in 1 week.   Specialty:  Family Medicine   Contact information:   [address] [place] [address] [phone]       Follow up with Judyann Munson, MD. Schedule an appointment as soon as possible for a visit in 1 week.   Specialty:  Infectious Diseases   Contact information:   [address] [place] [address] [phone]       The results of significant diagnostics from this hospitalization (including imaging, microbiology, ancillary and laboratory) are listed below for reference.    Significant Diagnostic Studies: Dg Abd 1 View  07/05/2013   *RADIOLOGY REPORT*  Clinical Data: Nausea.  Vomiting yesterday.  ABDOMEN - 1 VIEW  Comparison: None.  Findings: The bowel gas pattern is nonobstructive.  No gross plain film evidence of free air.  Stool and bowel gas extends to the rectosigmoid.  Increased densities at L5-S1 suggesting prior posterior lumbar fusion. Calcification projects over the left mid abdomen, likely representing an 9 mm renal collecting system calculus which was also seen on  prior lumbar spine radiograph.  No ureteral calculi identified.  Atherosclerosis.  IMPRESSION: Normal bowel gas pattern.  9 mm left interpolar renal collecting system calculus.   Original Report Authenticated By: Andreas Newport, M.D.   Ct Head Wo Contrast  07/04/2013   CLINICAL DATA:  72 year old male with visual hallucinations.  EXAM: CT HEAD WITHOUT CONTRAST  TECHNIQUE: Contiguous axial images were obtained from the base of the skull through the vertex without intravenous contrast.  COMPARISON:  01/29/2011.  FINDINGS: Visualized paranasal sinuses and mastoids are clear. Stable and negative non contrast CT appearance of the orbits. Visualized scalp soft tissues  are within normal limits. No acute osseous abnormality identified.  Calcified atherosclerosis at the skull base. Cerebral volume is not significantly changed. Chronic dural calcifications. No ventriculomegaly. No midline shift, mass effect, or evidence of intracranial mass lesion. No evidence of cortically based acute infarction identified. No acute intracranial hemorrhage identified. No suspicious intracranial vascular hyperdensity.  IMPRESSION: Stable and normal for age non contrast CT appearance of the brain.   Electronically Signed   By: Augusto Gamble M.D.   On: 07/04/2013 19:48   US Renal  07/05/2013   CLINICAL DATA:  Acute renal failure  EXAM: RENAL/URINARY TRACT ULTRASOUND COMPLETE  COMPARISON:  10/04/2006  FINDINGS: Right Kidney  Length: Measures 12 cm. Echogenicity diffusely increased. There is fullness of the collecting system, particularly the upper pole. Tiny cyst in the upper pole, measuring 7 mm. Scant perinephric edema, possibly associated with the acute renal failure.  Left Kidney  Length: Measures 12 cm. Diffuse increased echogenicity. There is a lower pole cyst measuring 1.8 cm. Scant perinephric edema, possibly associated with the acute renal failure. 8mm echogenic focus in the interpolar region, without shadowing or ring down artifact.   Bladder:  Completely decompressed around the Foley catheter.  Other: The 18 mm ovoid hyperechoic lesion within the upper liver, likely right lobe.  IMPRESSION: 1. Mildly echogenic kidneys suggesting an background medical renal disease. 2. Fullness of the bilateral collecting systems, but doubt urinary obstruction. Followup could ensure normalization. 3. Probable 8mm calculus in the interpolar left kidney, also seen in 2007. 4. Nonspecific 1.8 cm echogenic lesion in the right lobe liver. Recommend outpatient MRI, especially if there is history of chronic liver disease.   Electronically Signed   By: Tiburcio Pea   On: 07/05/2013 02:39    Microbiology: No results found for this or any previous visit (from the past 240 hour(s)).   Labs: Basic Metabolic Panel:  Recent Labs Lab 07/04/13 2300 07/05/13 0211 07/05/13 0510 07/05/13 0830 07/06/13 0502  NA 130* 132* 134* 132* 136  K 4.9 4.8 4.9 4.8 4.2  CL 97 99 100 99 103  CO2 17* 19 19 17* 22  GLUCOSE 137* 122* 112* 110* 123*  BUN 57* 47* 40* 34* 14  CREATININE 6.49* 5.24* 4.26* 3.29* 1.57*  CALCIUM 9.2 9.0 9.4 9.6 9.0   Liver Function Tests:  Recent Labs Lab 07/04/13 1230 07/04/13 1517  AST 31 29  ALT 28 23  ALKPHOS 36* 35*  BILITOT 0.5 0.3  PROT 7.5 6.9  ALBUMIN 3.8 2.9*    Recent Labs Lab 07/04/13 1751  LIPASE 49   CBC:  Recent Labs Lab 07/04/13 1230 07/04/13 1517 07/05/13 0510  WBC 4.7 3.7* 3.8*  NEUTROABS 3.1 2.4  --   HGB 14.6 13.7 13.9  HCT 40.5 38.2* 38.5*  MCV 73.6* 74.2* 74.2*  PLT 142* 125* 132*   CBG:  Recent Labs Lab 07/05/13 1204 07/05/13 1657 07/05/13 2153 07/06/13 0809 07/06/13 1152  GLUCAP 109* 115* 139* 139* 101*    Signed:  Liviah Cake  [facility] 07/06/2013, 2:31 PM

## 2013-07-11 DIAGNOSIS — R339 Retention of urine, unspecified: Secondary | ICD-10-CM | POA: Diagnosis not present

## 2013-07-11 DIAGNOSIS — B029 Zoster without complications: Secondary | ICD-10-CM | POA: Diagnosis not present

## 2013-07-13 ENCOUNTER — Other Ambulatory Visit: Payer: Self-pay | Admitting: Internal Medicine

## 2013-07-16 ENCOUNTER — Ambulatory Visit (INDEPENDENT_AMBULATORY_CARE_PROVIDER_SITE_OTHER): Payer: MEDICARE | Admitting: Family Medicine

## 2013-07-16 ENCOUNTER — Telehealth: Payer: Self-pay | Admitting: Family Medicine

## 2013-07-16 ENCOUNTER — Encounter: Payer: Self-pay | Admitting: Family Medicine

## 2013-07-16 VITALS — BP 120/70 | HR 88 | Wt 171.0 lb

## 2013-07-16 DIAGNOSIS — E1159 Type 2 diabetes mellitus with other circulatory complications: Secondary | ICD-10-CM

## 2013-07-16 DIAGNOSIS — N139 Obstructive and reflux uropathy, unspecified: Secondary | ICD-10-CM

## 2013-07-16 DIAGNOSIS — N179 Acute kidney failure, unspecified: Secondary | ICD-10-CM

## 2013-07-16 DIAGNOSIS — E1169 Type 2 diabetes mellitus with other specified complication: Secondary | ICD-10-CM | POA: Diagnosis not present

## 2013-07-16 DIAGNOSIS — B029 Zoster without complications: Secondary | ICD-10-CM | POA: Diagnosis not present

## 2013-07-16 DIAGNOSIS — I10 Essential (primary) hypertension: Secondary | ICD-10-CM

## 2013-07-16 DIAGNOSIS — B20 Human immunodeficiency virus [HIV] disease: Secondary | ICD-10-CM

## 2013-07-16 MED ORDER — OMEGA-3-ACID ETHYL ESTERS 1 G PO CAPS: 2.0000 g | ORAL_CAPSULE | Freq: Two times a day (BID) | ORAL | Status: DC

## 2013-07-16 NOTE — Patient Instructions (Signed)
Throw away the Valtrex. Stay on all your other medicines

## 2013-07-16 NOTE — Progress Notes (Signed)
  Subjective:    Patient ID: Jeremy Reeves, male    DOB: 04-13-1941, 72 y.o.   MRN: [id number]  HPI He is here for posthospitalization followup. The medical record was reviewed. He is now recovering from shingles of the right leg. He was given Zostavax initially however did not get the prescription filled when he left the hospital. He continues to have difficulty with obstructive uropathy and presently has a catheter in. He was seen since leaving the hospital by urology and has another appointment scheduled in the near future. He also scheduled to see ID for followup on his HIV. He is having no difficulty with that. Continues on other medications listed in the chart. He has no other concerns or complaints.  Review of Systems     Objective:   Physical Exam Alert and in no distress. Exam of the left lower extremity does show healing lesions in the L5 nerve root distribution.       Assessment & Plan:  Shingles outbreak  HIV disease  Obstructive uropathy  Hypertension associated with diabetes  Herpes zoster  Acute renal failure  told him not to take the Valtrex since at this point it would not be useful. He will followup with urology. He also has a followup appointment with infectious disease. I explained to him that I had no problem following his HIV disease and encouraged him to consider this. Explained that I have been involved with HIV care since the beginning. I will followup on his diabetes in several months.

## 2013-07-16 NOTE — Telephone Encounter (Signed)
Pt called to see if he can have a written prescription for Omega 3 (Lovaza 1 mg) for a #90 day supply for him to pick up so they can take it to a pharmacy for price quoting. He forgot to ask while he was here for his visit this morning.  They do not want to use mail order for this.

## 2013-07-18 ENCOUNTER — Telehealth: Payer: Self-pay | Admitting: Family Medicine

## 2013-07-18 NOTE — Telephone Encounter (Signed)
[name] called and went to get Lovaza and it was over $400.00 out of pocket and they cannot afford.  Can they use the [employer] brand over the counter fish oil 1000 mg    Concentrated omega 3 fatty acid.  Please advise [name] [phone]

## 2013-07-19 ENCOUNTER — Ambulatory Visit (INDEPENDENT_AMBULATORY_CARE_PROVIDER_SITE_OTHER): Payer: MEDICARE | Admitting: Internal Medicine

## 2013-07-19 ENCOUNTER — Encounter: Payer: Self-pay | Admitting: Internal Medicine

## 2013-07-19 VITALS — BP 119/74 | HR 79 | Temp 97.5°F | Wt 169.0 lb

## 2013-07-19 DIAGNOSIS — B20 Human immunodeficiency virus [HIV] disease: Secondary | ICD-10-CM

## 2013-07-19 DIAGNOSIS — E1169 Type 2 diabetes mellitus with other specified complication: Secondary | ICD-10-CM | POA: Diagnosis not present

## 2013-07-19 LAB — BASIC METABOLIC PANEL
BUN: 14 mg/dL (ref 6–23)
CO2: 26 mEq/L (ref 19–32)
Calcium: 10.1 mg/dL (ref 8.4–10.5)
Chloride: 102 mEq/L (ref 96–112)
Creat: 1.69 mg/dL — ABNORMAL HIGH (ref 0.50–1.35)
Glucose, Bld: 104 mg/dL — ABNORMAL HIGH (ref 70–99)

## 2013-07-19 NOTE — Progress Notes (Signed)
[facility] CL[facility] NOTE  RFV: routine, hospital follow up Subjective:    Patient ID: Jeremy Reeves, male    DOB: August 05, 1941, 72 y.o.   MRN: [id number]  HPI 72yo M with newly HIV diagnosed, CD 4 count 180/VL54,000 recently started on tivicay/epzicom roughly 1 month ago. HIV complicated with recent zoster affecting right foot, with sequela of neuropathy. At his last visit in mid September, he was feeling poorly, directly admitted from clinic for symptomatic hypotension and obstructive uropathy. On admit, his Cr 10 -> improved to 1.5, GFR 50. He still has foley catheter in place and is to see urology on Oct 14th.   Wilhelm is here with his wife, who cares for his med list.  Current Outpatient Prescriptions on File Prior to Visit  Medication Sig Dispense Refill  . abacavir-lamiVUDine (EPZICOM) 600-300 MG per tablet Take 1 tablet by mouth daily.  30 tablet  11  . aspirin 81 MG tablet Take 81 mg by mouth daily.      Marland Kitchen atorvastatin (LIPITOR) 80 MG tablet Take 1 tablet (80 mg total) by mouth daily.  90 tablet  3  . carvedilol (COREG) 3.125 MG tablet Take 1 tablet (3.125 mg total) by mouth 2 (two) times daily with a meal.  180 tablet  3  . Cinnamon 500 MG capsule Take 1,000 mg by mouth daily.      . dolutegravir (TIVICAY) 50 MG tablet Take 1 tablet (50 mg total) by mouth daily.  30 tablet  11  . glucose blood test strip This is for contour meter  100 each  prn  . Lancets MISC This is for the contour  100 each  prn  . Multiple Vitamins-Minerals (MULTIVITAMIN WITH MINERALS) tablet Take 1 tablet by mouth daily.        . niacin (NIASPAN) 1000 MG CR tablet Take 2 tablets (2,000 mg total) by mouth at bedtime.  180 tablet  3  . omega-3 acid ethyl esters (LOVAZA) 1 G capsule Take 2 capsules (2 g total) by mouth 2 (two) times daily.  360 capsule  3  . omeprazole (PRILOSEC) 20 MG capsule Take 20 mg by mouth daily.      Marland Kitchen sulfamethoxazole-trimethoprim (SEPTRA) 400-80 MG per tablet Take 1 tablet by mouth daily.  30  tablet  3  . tamsulosin (FLOMAX) 0.4 MG CAPS capsule Take 1 capsule (0.4 mg total) by mouth daily.  30 capsule  0  . valACYclovir (VALTREX) 1000 MG tablet TAKE 1 TABLET BY MOUTH THREE TIMES DAILY  21 tablet  0  . vitamin E 400 UNIT capsule Take 400 Units by mouth daily.         No current facility-administered medications on file prior to visit.   Active Ambulatory Problems    Diagnosis Date Noted  . ASHD (arteriosclerotic heart disease) 03/18/2011  . BPH (benign prostatic hyperplasia) 03/18/2011  . Hyperlipidemia LDL goal <70 03/18/2011  . Diabetes mellitus 03/18/2011  . Hypertension associated with diabetes 01/25/2013  . HIV disease 05/24/2013  . Nausea with vomiting 07/04/2013  . Hypotension, unspecified 07/04/2013  . Hyponatremia 07/04/2013  . Acute renal failure 07/04/2013  . Obstructive uropathy 07/05/2013  . GERD (gastroesophageal reflux disease) 07/05/2013  . Herpes zoster 07/05/2013   Resolved Ambulatory Problems    Diagnosis Date Noted  . No Resolved Ambulatory Problems   Past Medical History  Diagnosis Date  . Hyperlipidemia   . Hiatal hernia   . HH (hiatus hernia)   . Type II diabetes mellitus   .  HIV (human immunodeficiency virus infection) dx'd 2014        Review of Systems     Objective:   Physical Exam BP 119/74  Pulse 79  Temp(Src) 97.5 F (36.4 C) (Oral)  Wt 169 lb (76.658 kg)  BMI 26.46 kg/m2 Physical Exam  Constitutional: He is oriented to person, place, and time. He appears well-developed and well-nourished. No distress.  HENT:  Mouth/Throat: Oropharynx is clear and moist. No oropharyngeal exudate.  Cardiovascular: Normal rate, regular rhythm and normal heart sounds. Exam reveals no gallop and no friction rub.  No murmur heard.  Pulmonary/Chest: Effort normal and breath sounds normal. No respiratory distress. He has no wheezes.  Abdominal: Soft. Bowel sounds are normal. He exhibits no distension. There is no tenderness.  Lymphadenopathy:   He has no cervical adenopathy.  Neurological: He is alert and oriented to person, place, and time.  Skin: Skin is warm and dry. No rash noted. No erythema.  Psychiatric: He has a normal mood and affect. His behavior is normal.          Assessment & Plan:  HIV= continue tivicay/epzicom for now. If kidney function continues to do better, can consider changing to one combination tablet of triomeq.  OI proph = will hold bactrim to minimize any insult to kidney function. Will check g6pd to see if can switch to dapsone.  Acute on CKD = due to obstructive uropathy in mid sep. Will check bmp today to see if back to  baseline  Decrease appetite = has lost 13# in the last month, unintentional. glucerna 2 cans in addition 3 meals a day.  rtc in 4-6 wks.

## 2013-07-19 NOTE — Telephone Encounter (Signed)
Pt informed ok to get otc fishoil

## 2013-07-19 NOTE — Telephone Encounter (Signed)
Tell them to go ahead and try this.

## 2013-07-20 ENCOUNTER — Telehealth: Payer: Self-pay | Admitting: *Deleted

## 2013-07-20 NOTE — Telephone Encounter (Signed)
Per Dr Drue Second called the patient and scheduled him to come in sooner for his follow up appt. He will come 08/01/13 at 945 am. The patient also reported that he has blood in his urine since last night. Advised the patient that since he has a Foley in he would need to call his urologist asap.

## 2013-07-25 DIAGNOSIS — R339 Retention of urine, unspecified: Secondary | ICD-10-CM | POA: Diagnosis not present

## 2013-07-25 DIAGNOSIS — N509 Disorder of male genital organs, unspecified: Secondary | ICD-10-CM | POA: Diagnosis not present

## 2013-07-25 DIAGNOSIS — N453 Epididymo-orchitis: Secondary | ICD-10-CM | POA: Diagnosis not present

## 2013-07-25 DIAGNOSIS — R509 Fever, unspecified: Secondary | ICD-10-CM | POA: Diagnosis not present

## 2013-07-31 DIAGNOSIS — R339 Retention of urine, unspecified: Secondary | ICD-10-CM | POA: Diagnosis not present

## 2013-08-01 ENCOUNTER — Encounter: Payer: Self-pay | Admitting: Internal Medicine

## 2013-08-01 ENCOUNTER — Ambulatory Visit (INDEPENDENT_AMBULATORY_CARE_PROVIDER_SITE_OTHER): Payer: MEDICARE | Admitting: Internal Medicine

## 2013-08-01 VITALS — BP 113/72 | HR 82 | Wt 167.0 lb

## 2013-08-01 DIAGNOSIS — B20 Human immunodeficiency virus [HIV] disease: Secondary | ICD-10-CM | POA: Diagnosis not present

## 2013-08-01 LAB — COMPLETE METABOLIC PANEL WITH GFR
ALT: 43 U/L (ref 0–53)
BUN: 14 mg/dL (ref 6–23)
CO2: 28 mEq/L (ref 19–32)
Calcium: 9.8 mg/dL (ref 8.4–10.5)
Chloride: 102 mEq/L (ref 96–112)
Creat: 1.4 mg/dL — ABNORMAL HIGH (ref 0.50–1.35)
GFR, Est African American: 58 mL/min — ABNORMAL LOW
GFR, Est Non African American: 51 mL/min — ABNORMAL LOW
Glucose, Bld: 134 mg/dL — ABNORMAL HIGH (ref 70–99)
Total Bilirubin: 0.4 mg/dL (ref 0.3–1.2)

## 2013-08-01 MED ORDER — DAPSONE 100 MG PO TABS: 100.0000 mg | ORAL_TABLET | Freq: Every day | ORAL | Status: DC

## 2013-08-01 MED ORDER — PREGABALIN 75 MG PO CAPS: 75.0000 mg | ORAL_CAPSULE | Freq: Two times a day (BID) | ORAL | Status: DC

## 2013-08-01 NOTE — Progress Notes (Signed)
Subjective:    Patient ID: Jeremy Reeves, male    DOB: 1940-10-21, 72 y.o.   MRN: [id number]  HPI 72yo M with newly diagnosed HIV, CD 4 count of 280(22%)/VL <20, after 6 wks of therapy with tivicay/epzicom. He has had obstructive uropathy causing CKD, Cr 1.69 at visit 2 wks ago. 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 He is still foley dependent per urology ,who will re-evaluate on 6/22  Current Outpatient Prescriptions on File Prior to Visit  Medication Sig Dispense Refill  . abacavir-lamiVUDine (EPZICOM) 600-300 MG per tablet Take 1 tablet by mouth daily.  30 tablet  11  . aspirin 81 MG tablet Take 81 mg by mouth daily.      Marland Kitchen atorvastatin (LIPITOR) 80 MG tablet Take 1 tablet (80 mg total) by mouth daily.  90 tablet  3  . carvedilol (COREG) 3.125 MG tablet Take 1 tablet (3.125 mg total) by mouth 2 (two) times daily with a meal.  180 tablet  3  . Cinnamon 500 MG capsule Take 1,000 mg by mouth daily.      . dolutegravir (TIVICAY) 50 MG tablet Take 1 tablet (50 mg total) by mouth daily.  30 tablet  11  . glucose blood test strip This is for contour meter  100 each  prn  . Lancets MISC This is for the contour  100 each  prn  . Multiple Vitamins-Minerals (MULTIVITAMIN WITH MINERALS) tablet Take 1 tablet by mouth daily.        . niacin (NIASPAN) 1000 MG CR tablet Take 2 tablets (2,000 mg total) by mouth at bedtime.  180 tablet  3  . omega-3 acid ethyl esters (LOVAZA) 1 G capsule Take 2 capsules (2 g total) by mouth 2 (two) times daily.  360 capsule  3  . omeprazole (PRILOSEC) 20 MG capsule Take 20 mg by mouth daily.      Marland Kitchen sulfamethoxazole-trimethoprim (SEPTRA) 400-80 MG per tablet Take 1 tablet by mouth daily.  30 tablet  3  . tamsulosin (FLOMAX) 0.4 MG CAPS capsule Take 1 capsule (0.4 mg total) by mouth daily.  30 capsule  0  . valACYclovir (VALTREX) 1000 MG tablet TAKE 1 TABLET BY MOUTH THREE TIMES DAILY  21 tablet  0  . vitamin E 400 UNIT capsule Take 400 Units by mouth  daily.         No current facility-administered medications on file prior to visit.   Active Ambulatory Problems    Diagnosis Date Noted  . ASHD (arteriosclerotic heart disease) 03/18/2011  . BPH (benign prostatic hyperplasia) 03/18/2011  . Hyperlipidemia LDL goal <70 03/18/2011  . Diabetes mellitus 03/18/2011  . Hypertension associated with diabetes 01/25/2013  . HIV disease 05/24/2013  . Nausea with vomiting 07/04/2013  . Hypotension, unspecified 07/04/2013  . Hyponatremia 07/04/2013  . Acute renal failure 07/04/2013  . Obstructive uropathy 07/05/2013  . GERD (gastroesophageal reflux disease) 07/05/2013  . Herpes zoster 07/05/2013   Resolved Ambulatory Problems    Diagnosis Date Noted  . No Resolved Ambulatory Problems   Past Medical History  Diagnosis Date  . Hyperlipidemia   . Hiatal hernia   . HH (hiatus hernia)   . Type II diabetes mellitus   . HIV (human immunodeficiency virus infection) dx'd 2014       Review of Systems Neuropathic pain of right foot. Poor sleep due to needing to frequenlty empty foley bag    Objective:   Physical Exam BP 113/72  Pulse  82  Wt 167 lb (75.751 kg)  BMI 26.15 kg/m2 Physical Exam  Constitutional: He is oriented to person, place, and time. He appears well-developed and well-nourished. No distress.  HENT:  Mouth/Throat: Oropharynx is clear and moist. No oropharyngeal exudate.  Cardiovascular: Normal rate, regular rhythm and normal heart sounds. Exam reveals no gallop and no friction rub.  No murmur heard.  Pulmonary/Chest: Effort normal and breath sounds normal. No respiratory distress. He has no wheezes.  Abdominal: Soft. Bowel sounds are normal. He exhibits no distension. There is no tenderness.  Lymphadenopathy:  He has no cervical adenopathy.  Neurological: He is alert and oriented to person, place, and time.  Skin: Skin is warm and dry. No rash noted. No erythema. Dried scab from prior lesions to dorsum and plantar aspect  of right foot Psychiatric: He has a normal mood and affect. His behavior is normal.    Labs: BMET    Component Value Date/Time   NA 138 08/01/2013 1049   K 4.6 08/01/2013 1049   CL 102 08/01/2013 1049   CO2 28 08/01/2013 1049   GLUCOSE 134* 08/01/2013 1049   BUN 14 08/01/2013 1049   CREATININE 1.40* 08/01/2013 1049   CREATININE 1.57* 07/06/2013 0502   CALCIUM 9.8 08/01/2013 1049   GFRNONAA 43* 07/06/2013 0502   GFRAA 50* 07/06/2013 0502          Assessment & Plan:  hiv = continue with tivicay/epzicom  ckd = will check GFR, appears improving  oi proph = continue with dapsone and stop bactrim. Needs tx for 3 more months to stop in Jan 2015  Neuropathic pain = will do trial of lyrica

## 2013-08-08 ENCOUNTER — Telehealth: Payer: Self-pay | Admitting: Licensed Clinical Social Worker

## 2013-08-08 ENCOUNTER — Telehealth: Payer: Self-pay | Admitting: Internal Medicine

## 2013-08-08 DIAGNOSIS — N453 Epididymo-orchitis: Secondary | ICD-10-CM | POA: Diagnosis not present

## 2013-08-08 DIAGNOSIS — B0223 Postherpetic polyneuropathy: Secondary | ICD-10-CM | POA: Diagnosis not present

## 2013-08-08 DIAGNOSIS — R339 Retention of urine, unspecified: Secondary | ICD-10-CM | POA: Diagnosis not present

## 2013-08-08 DIAGNOSIS — N401 Enlarged prostate with lower urinary tract symptoms: Secondary | ICD-10-CM | POA: Diagnosis not present

## 2013-08-08 NOTE — Telephone Encounter (Signed)
Patient has swelling in both feet, after starting Lyrica. Patient's wife read that it could cause swelling in feet, ankles, and hands in patients with heart conditions. I informed her that Dr. Drue Second will be out until Friday, and to call Dr. Susann Givens which is the patient's pcp to evaluate the swelling and determine if it is from the medication.

## 2013-08-08 NOTE — Telephone Encounter (Signed)
Pt is on lyrica 75mg  and he is having swelling in the ankles and feet and wants to know if he needs to stop the medicine or still keep taking it

## 2013-08-09 ENCOUNTER — Telehealth: Payer: Self-pay | Admitting: *Deleted

## 2013-08-09 DIAGNOSIS — E1140 Type 2 diabetes mellitus with diabetic neuropathy, unspecified: Secondary | ICD-10-CM

## 2013-08-09 MED ORDER — GABAPENTIN 300 MG PO CAPS: 300.0000 mg | ORAL_CAPSULE | Freq: Three times a day (TID) | ORAL | Status: DC

## 2013-08-09 MED ORDER — GABAPENTIN 100 MG PO CAPS: 100.0000 mg | ORAL_CAPSULE | Freq: Two times a day (BID) | ORAL | Status: DC

## 2013-08-09 NOTE — Telephone Encounter (Signed)
Wife concerned about the swelling in the presence of heart disease 3 days after starting Lyrica.  Pt had noted improvement in lower extremity pain/discomfort.  Was sleeping better on the Lyrica.  Then, the swelling started in the leg/foot previously effected by Shingles then in the other leg/foot.  After stopping the Lyrica 08/08/13 the Shingles effected leg/foot remains swollen and the other leg is improving.  RN requested that the wife call back tomorrow morning to update [facility] on the pt's status.  MD please advise about plan.

## 2013-08-09 NOTE — Telephone Encounter (Signed)
I would recheck on this, will likely need to check a few labs.  Did it start immediately after he began the medication?  When did he begin Lyrica?

## 2013-08-09 NOTE — Telephone Encounter (Signed)
Dr. Drue Second gave verbal order for Neurontin rx.  RN sent rx to [facility].  Call to pt's wife about change in medication and that it will be ready for pick up today.  Pt has a return appt scheduled for Nov. 13 @ 0900.

## 2013-08-15 NOTE — Telephone Encounter (Signed)
Cell phone is not taking calls at this time. CLS

## 2013-08-16 ENCOUNTER — Ambulatory Visit: Payer: Medicare PPO | Admitting: Internal Medicine

## 2013-08-17 ENCOUNTER — Encounter: Payer: Self-pay | Admitting: Family Medicine

## 2013-08-17 NOTE — Telephone Encounter (Signed)
I was unable to get in touch with this patient so I mailed him a letter. CLS

## 2013-08-20 ENCOUNTER — Telehealth: Payer: Self-pay | Admitting: *Deleted

## 2013-08-20 NOTE — Telephone Encounter (Signed)
Spoke with Dr Drue Second and she advised to get the patient in to see her tomorrow 08/21/13 and she has an appt open for 930.

## 2013-08-20 NOTE — Telephone Encounter (Signed)
Patient wife called and advised he is still swelling and has gotten no relief. She advised no pain unless he tries to put on a shoe and that it did not get any better when he was switched from Lyrica to Gabapentin. She is worried about all the swelling with his history of renal failure. Advised her will have to give Dr Drue Second a call and give her a call back or have Dr Drue Second give her a call back.

## 2013-08-21 ENCOUNTER — Telehealth: Payer: Self-pay | Admitting: Medical

## 2013-08-21 ENCOUNTER — Encounter: Payer: Self-pay | Admitting: Internal Medicine

## 2013-08-21 ENCOUNTER — Ambulatory Visit (INDEPENDENT_AMBULATORY_CARE_PROVIDER_SITE_OTHER): Payer: MEDICARE | Admitting: Internal Medicine

## 2013-08-21 VITALS — BP 143/80 | HR 74 | Temp 98.2°F | Ht 67.0 in | Wt 179.0 lb

## 2013-08-21 DIAGNOSIS — Z23 Encounter for immunization: Secondary | ICD-10-CM | POA: Diagnosis not present

## 2013-08-21 DIAGNOSIS — B20 Human immunodeficiency virus [HIV] disease: Secondary | ICD-10-CM | POA: Diagnosis not present

## 2013-08-21 DIAGNOSIS — E1169 Type 2 diabetes mellitus with other specified complication: Secondary | ICD-10-CM | POA: Diagnosis not present

## 2013-08-21 NOTE — Progress Notes (Signed)
[facility] NOTE  RFV: routine  Subjective:    Patient ID: Jeremy Reeves, male    DOB: 1940/10/21, 72 y.o.   MRN: [id number]  HPI 72yo M with newly diagnosed HIV, CD 4 count of 280(22%)/VL <20, after 6 wks of therapy with tivicay/epzicom. He has had obstructive uropathy causing CKD, Cr 1.4 at last visit. 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 He was taking lyrica which worked initially but now having lower extremity swelling bilaterally and intolerant of medication. He states that the swelling in his legs right> left,mostly on dorsum of feet. Trace edema on shins bilaterally. He states that the swelling worsens by the end of the course of the day.  Current Outpatient Prescriptions on File Prior to Visit  Medication Sig Dispense Refill  . abacavir-lamiVUDine (EPZICOM) 600-300 MG per tablet Take 1 tablet by mouth daily.  30 tablet  11  . carvedilol (COREG) 3.125 MG tablet Take 1 tablet (3.125 mg total) by mouth 2 (two) times daily with a meal.  180 tablet  3  . Cinnamon 500 MG capsule Take 1,000 mg by mouth daily.      . dapsone 100 MG tablet Take 1 tablet (100 mg total) by mouth daily.  30 tablet  5  . dolutegravir (TIVICAY) 50 MG tablet Take 1 tablet (50 mg total) by mouth daily.  30 tablet  11  . gabapentin (NEURONTIN) 300 MG capsule Take 1 capsule (300 mg total) by mouth 3 (three) times daily. May take additional dose in the afternoon if needed for pain relief.  90 capsule  2  . glucose blood test strip This is for contour meter  100 each  prn  . Lancets MISC This is for the contour  100 each  prn  . Multiple Vitamins-Minerals (MULTIVITAMIN WITH MINERALS) tablet Take 1 tablet by mouth daily.        . niacin (NIASPAN) 1000 MG CR tablet Take 2 tablets (2,000 mg total) by mouth at bedtime.  180 tablet  3  . omeprazole (PRILOSEC) 20 MG capsule Take 20 mg by mouth daily.      . tamsulosin (FLOMAX) 0.4 MG CAPS capsule Take 1 capsule (0.4 mg total) by mouth daily.  30 capsule   0  . vitamin E 400 UNIT capsule Take 400 Units by mouth daily.        Marland Kitchen aspirin 81 MG tablet Take 81 mg by mouth daily.      Marland Kitchen atorvastatin (LIPITOR) 80 MG tablet Take 1 tablet (80 mg total) by mouth daily.  90 tablet  3  . omega-3 acid ethyl esters (LOVAZA) 1 G capsule Take 2 capsules (2 g total) by mouth 2 (two) times daily.  360 capsule  3   No current facility-administered medications on file prior to visit.   Active Ambulatory Problems    Diagnosis Date Noted  . ASHD (arteriosclerotic heart disease) 03/18/2011  . BPH (benign prostatic hyperplasia) 03/18/2011  . Hyperlipidemia LDL goal <70 03/18/2011  . Diabetes mellitus 03/18/2011  . Hypertension associated with diabetes 01/25/2013  . HIV disease 05/24/2013  . Nausea with vomiting 07/04/2013  . Hypotension, unspecified 07/04/2013  . Hyponatremia 07/04/2013  . Acute renal failure 07/04/2013  . Obstructive uropathy 07/05/2013  . GERD (gastroesophageal reflux disease) 07/05/2013  . Herpes zoster 07/05/2013  . Diabetic neuropathy 08/09/2013   Resolved Ambulatory Problems    Diagnosis Date Noted  . No Resolved Ambulatory Problems   Past Medical History  Diagnosis  Date  . Hyperlipidemia   . Hiatal hernia   . HH (hiatus hernia)   . Type II diabetes mellitus   . HIV (human immunodeficiency virus infection) dx'd 2014     Review of Systems   Constitutional: Negative for fever, chills, diaphoresis, activity change, appetite change, fatigue and unexpected weight change.  HENT: Negative for congestion, sore throat, rhinorrhea, sneezing, trouble swallowing and sinus pressure.  Eyes: Negative for photophobia and visual disturbance.  Respiratory: Negative for cough, chest tightness, shortness of breath, wheezing and stridor.  Cardiovascular: Negative for chest pain, palpitations and leg swelling.  Gastrointestinal: Negative for nausea, vomiting, abdominal pain, diarrhea, constipation, blood in stool, abdominal distention and anal  bleeding.  Genitourinary: Negative for dysuria, hematuria, flank pain and difficulty urinating.  Musculoskeletal: Negative for myalgias, back pain, joint swelling, arthralgias and gait problem. Ext: lower extremity swelling bilaterally.  Skin: Negative for color change, pallor, rash and wound.  Neurological: Negative for dizziness, tremors, weakness and light-headedness.  Hematological: Negative for adenopathy. Does not bruise/bleed easily.  Psychiatric/Behavioral: Negative for behavioral problems, confusion, sleep disturbance, dysphoric mood, decreased concentration and agitation.       Objective:   Physical Exam BP 143/80  Pulse 74  Temp(Src) 98.2 F (36.8 C) (Oral)  Ht 5\' 7"  (1.702 m)  Wt 179 lb (81.194 kg)  BMI 28.03 kg/m2  Constitutional: He is oriented to person, place, and time. He appears well-developed and well-nourished. No distress.  HENT:  Mouth/Throat: Oropharynx is clear and moist. No oropharyngeal exudate.  Cardiovascular: Normal rate, regular rhythm and normal heart sounds. Exam reveals no gallop and no friction rub.  No murmur heard.  Pulmonary/Chest: Effort normal and breath sounds normal. No respiratory distress. He has no wheezes.  Abdominal: Soft. Bowel sounds are normal. He exhibits no distension. There is no tenderness.  Lymphadenopathy:  He has no cervical adenopathy.  Neurological: He is alert and oriented to person, place, and time.  Skin: Skin is warm and dry. No rash noted. No erythema.  Ext: 1+ pitting edema of lower extremity/dorsum of feet bilaterally Psychiatric: He has a normal mood and affect. His behavior is normal.       Assessment & Plan:  HIV = continue with tivicay/epzicom  Obstructive uropathy = to have evaluation by urology for foley removal on 11/24th vs. eval for turp  Swelling of feet = do a trial of compression stalking  Peripheral neuropathy/post herpetic neuropathy = gabapentin 300mg  TID, no more than this since this is  adjusted for his renal function  oi proph = finish up dapsone in the next 10 days

## 2013-08-21 NOTE — Progress Notes (Signed)
  [facility] - Pharmacist    HPI: Jeremy Reeves is a 72 y.o. male here for follow-up of neuropathy.  He reports lower extremity swelling and continued pain.    Allergies: Allergies  Allergen Reactions  . Peanut-Containing Drug Products Swelling    Vitals: Temp: 98.2 F (36.8 C) (11/04 0946) Temp src: Oral (11/04 0946) BP: 143/80 mmHg (11/04 0946) Pulse Rate: 74 (11/04 0946)  Past Medical History: Past Medical History  Diagnosis Date  . ASHD (arteriosclerotic heart disease)   . Hyperlipidemia   . BPH (benign prostatic hyperplasia)   . Hiatal hernia   . HH (hiatus hernia)   . Type II diabetes mellitus   . HIV (human immunodeficiency virus infection) dx'd 2014  . GERD (gastroesophageal reflux disease)     Social History: History   Social History  . Marital Status: Married    Spouse Name: N/A    Number of Children: N/A  . Years of Education: N/A   Social History Main Topics  . Smoking status: Former Smoker -- 0.12 packs/day for 40 years    Types: Cigarettes  . Smokeless tobacco: Never Used     Comment: 07/04/2013 "I quit smoking before 2000"  . Alcohol Use: No  . Drug Use: No  . Sexual Activity: Not Currently    Partners: Female     Comment: declined condoms   Other Topics Concern  . None   Social History Narrative  . None    Current Regimen: Dolutegravir, Epzicom Gabapentin - he is currently taking: 100mg  caps - 3 QAM, 3 QPM 300mg  caps - 1 TID  Labs: HIV 1 RNA Quant (copies/mL)  Date Value  07/19/2013 <20   05/24/2013 53791*     CD4 T Cell Abs (/uL)  Date Value  07/19/2013 260*  05/24/2013 180*     Hep B S Ab (no units)  Date Value  05/24/2013 NEG      Hepatitis B Surface Ag (no units)  Date Value  05/24/2013 NEGATIVE      HCV Ab (no units)  Date Value  05/24/2013 NEGATIVE     CrCl: Estimated Creatinine Clearance: 49.4 ml/min (by C-G formula based on Cr of 1.4).  Lipids:    Component Value Date/Time   CHOL 85 05/24/2013 1016   TRIG 62 05/24/2013 1016   HDL 31* 05/24/2013 1016   CHOLHDL 2.7 05/24/2013 1016   VLDL 12 05/24/2013 1016   LDLCALC 42 05/24/2013 1016    Assessment: Mr. Jeremy Reeves is taking too much gabapentin for his renal function, and is experiencing some drowsiness as a result.    Recommendations: Discussed with Dr. Drue Second - will decrease his Gabapentin to 300mg  TID, with an option to take QID if needed.  Sallee Provencal, Pharm.D., BCPS, AAHIVP Clinical Infectious Disease Pharmacist [facility] 08/21/2013, 10:53 AM

## 2013-08-22 ENCOUNTER — Telehealth: Payer: Self-pay | Admitting: *Deleted

## 2013-08-22 NOTE — Telephone Encounter (Signed)
Jeremy Reeves, could you call the medical supply store. i don't know what degree of compression is needed? More than a sock? i would aim for light-moderate compression.

## 2013-08-22 NOTE — Telephone Encounter (Signed)
Recheck with me or Dr. Susann Givens regarding leg edema

## 2013-08-22 NOTE — Telephone Encounter (Signed)
Patient wife called and advised she went to get the compression stocking Dr Drue Second wanted her husband to have and was told they need a Rx as they come in degrees of compression. She advised if Dr Drue Second will write the Rx she will come and pick it up. Asked that someone call her once it is done.

## 2013-08-24 ENCOUNTER — Telehealth: Payer: Self-pay | Admitting: *Deleted

## 2013-08-24 ENCOUNTER — Telehealth: Payer: Self-pay | Admitting: Medical

## 2013-08-24 NOTE — Telephone Encounter (Signed)
Called [facility] to get the measurment on the compression stockings. I was told that light to moderate pressure is 15-20 or 20-30 mmhg. I called the patient and advised them I will call her back once I have a signed Rx as the store will not allow a called in Rx.

## 2013-08-24 NOTE — Telephone Encounter (Signed)
Wife staes pt has appt on 12/29 with Dr Susann Givens. Does pt need to be seen sooner for the edema?

## 2013-08-24 NOTE — Telephone Encounter (Signed)
If the swelling is significant to him or worsening, then yes, maybe sooner appt, as 12/29 is a long way away

## 2013-08-26 ENCOUNTER — Emergency Department (HOSPITAL_COMMUNITY)
Admission: EM | Admit: 2013-08-26 | Discharge: 2013-08-26 | Disposition: A | Discharge status: Home / Self Care | Payer: Medicare Other | Attending: Emergency Medicine | Admitting: Emergency Medicine

## 2013-08-26 ENCOUNTER — Encounter (HOSPITAL_COMMUNITY): Payer: Self-pay | Admitting: Emergency Medicine

## 2013-08-26 DIAGNOSIS — K219 Gastro-esophageal reflux disease without esophagitis: Secondary | ICD-10-CM | POA: Insufficient documentation

## 2013-08-26 DIAGNOSIS — Z21 Asymptomatic human immunodeficiency virus [HIV] infection status: Secondary | ICD-10-CM | POA: Diagnosis not present

## 2013-08-26 DIAGNOSIS — Z87891 Personal history of nicotine dependence: Secondary | ICD-10-CM | POA: Diagnosis not present

## 2013-08-26 DIAGNOSIS — Z87448 Personal history of other diseases of urinary system: Secondary | ICD-10-CM | POA: Diagnosis not present

## 2013-08-26 DIAGNOSIS — Z8679 Personal history of other diseases of the circulatory system: Secondary | ICD-10-CM | POA: Insufficient documentation

## 2013-08-26 DIAGNOSIS — Z79899 Other long term (current) drug therapy: Secondary | ICD-10-CM | POA: Insufficient documentation

## 2013-08-26 DIAGNOSIS — Z7982 Long term (current) use of aspirin: Secondary | ICD-10-CM | POA: Diagnosis not present

## 2013-08-26 DIAGNOSIS — E785 Hyperlipidemia, unspecified: Secondary | ICD-10-CM | POA: Diagnosis not present

## 2013-08-26 DIAGNOSIS — T83091A Other mechanical complication of indwelling urethral catheter, initial encounter: Secondary | ICD-10-CM | POA: Insufficient documentation

## 2013-08-26 DIAGNOSIS — E119 Type 2 diabetes mellitus without complications: Secondary | ICD-10-CM | POA: Insufficient documentation

## 2013-08-26 DIAGNOSIS — Y846 Urinary catheterization as the cause of abnormal reaction of the patient, or of later complication, without mention of misadventure at the time of the procedure: Secondary | ICD-10-CM | POA: Insufficient documentation

## 2013-08-26 DIAGNOSIS — T839XXA Unspecified complication of genitourinary prosthetic device, implant and graft, initial encounter: Secondary | ICD-10-CM

## 2013-08-26 NOTE — ED Notes (Signed)
Pt from home reports that he has had foley x1 month, bag is leaking, just needs new bag. Pt has no other c/o. Pt is A&O and in NAD

## 2013-08-26 NOTE — ED Provider Notes (Signed)
Medical screening examination/treatment/procedure(s) were performed by non-physician practitioner and as supervising physician I was immediately available for consultation/collaboration.  EKG Interpretation   None         Lyanne Co, MD 08/26/13 1521

## 2013-08-26 NOTE — ED Notes (Signed)
Leg bag replaced per PA Lawyer

## 2013-08-26 NOTE — ED Provider Notes (Signed)
CSN: [id number]     Arrival date & time 08/26/13  1357 History   This chart was scribed for non-physician practitioner working with No att. providers found, by Yevette Edwards, ED Scribe. This patient was seen in room [facility] and the patient's care was started at 2:26 PM None    Chief Complaint  Patient presents with  . Foley bag problem    (Consider location/radiation/quality/duration/timing/severity/associated sxs/prior Treatment) HPI HPI Comments: Jeremy Reeves is a 72 y.o. male who presents to the Emergency Department requesting a new foley bag since the current one is leaking.    Past Medical History  Diagnosis Date  . ASHD (arteriosclerotic heart disease)   . Hyperlipidemia   . BPH (benign prostatic hyperplasia)   . Hiatal hernia   . HH (hiatus hernia)   . Type II diabetes mellitus   . HIV (human immunodeficiency virus infection) dx'd 2014  . GERD (gastroesophageal reflux disease)    Past Surgical History  Procedure Laterality Date  . Colonoscopy  2004  . 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 Cardiac catheterization  2001   No family history on file. History  Substance Use Topics  . Smoking status: Former Smoker -- 0.12 packs/day for 40 years    Types: Cigarettes  . Smokeless tobacco: Never Used     Comment: 07/04/2013 "I quit smoking before 2000"  . Alcohol Use: No    Review of Systems  Allergies  Peanut-containing drug products  Home Medications   Current Outpatient Rx  Name  Route  Sig  Dispense  Refill  . abacavir-lamiVUDine (EPZICOM) 600-300 MG per tablet   Oral   Take 1 tablet by mouth daily.   30 tablet   11   . aspirin 81 MG tablet   Oral   Take 81 mg by mouth daily.         Marland Kitchen atorvastatin (LIPITOR) 80 MG tablet   Oral   Take 1 tablet (80 mg total) by mouth daily.   90 tablet   3   . carvedilol (COREG) 3.125 MG tablet   Oral   Take 1 tablet (3.125 mg total) by mouth 2 (two) times daily with a meal.   180  tablet   3   . Cinnamon 500 MG capsule   Oral   Take 1,000 mg by mouth daily.         . dapsone 100 MG tablet   Oral   Take 1 tablet (100 mg total) by mouth daily.   30 tablet   5   . dolutegravir (TIVICAY) 50 MG tablet   Oral   Take 1 tablet (50 mg total) by mouth daily.   30 tablet   11   . gabapentin (NEURONTIN) 300 MG capsule   Oral   Take 1 capsule (300 mg total) by mouth 3 (three) times daily. May take additional dose in the afternoon if needed for pain relief.   90 capsule   2   . glucose blood test strip      This is for contour meter   100 each   prn   . Lancets MISC      This is for the contour   100 each   prn   . Multiple Vitamins-Minerals (MULTIVITAMIN WITH MINERALS) tablet   Oral   Take 1 tablet by mouth daily.           . niacin (NIASPAN) 1000 MG CR tablet  Oral   Take 2 tablets (2,000 mg total) by mouth at bedtime.   180 tablet   3   . omega-3 acid ethyl esters (LOVAZA) 1 G capsule   Oral   Take 2 capsules (2 g total) by mouth 2 (two) times daily.   360 capsule   3   . omeprazole (PRILOSEC) 20 MG capsule   Oral   Take 20 mg by mouth daily.         . tamsulosin (FLOMAX) 0.4 MG CAPS capsule   Oral   Take 1 capsule (0.4 mg total) by mouth daily.   30 capsule   0   . vitamin E 400 UNIT capsule   Oral   Take 400 Units by mouth daily.            BP 142/79  Pulse 88  Temp(Src) 98.4 F (36.9 C) (Oral)  Resp 20  SpO2 99% Physical Exam  Nursing note and vitals reviewed. Constitutional: He is oriented to person, place, and time. He appears well-developed and well-nourished.  Pulmonary/Chest: Effort normal.  Neurological: He is alert and oriented to person, place, and time.    ED Course  Procedures (including critical care time) Patient's leg bag was replaced by the nurse.  I advised him to followup with whomever placed the Foley   Carlyle Dolly, PA-C 08/26/13 1427

## 2013-08-27 NOTE — Telephone Encounter (Signed)
Wife states they are going to get compression stocking today to see if that helps and will call to make appt next week if no further improvement in edema

## 2013-08-28 NOTE — Telephone Encounter (Signed)
tsd  °

## 2013-08-30 ENCOUNTER — Ambulatory Visit (INDEPENDENT_AMBULATORY_CARE_PROVIDER_SITE_OTHER): Payer: MEDICARE | Admitting: Internal Medicine

## 2013-08-30 ENCOUNTER — Encounter: Payer: Self-pay | Admitting: Internal Medicine

## 2013-08-30 VITALS — BP 125/70 | HR 82 | Temp 97.7°F | Wt 178.0 lb

## 2013-08-30 DIAGNOSIS — B20 Human immunodeficiency virus [HIV] disease: Secondary | ICD-10-CM

## 2013-08-30 DIAGNOSIS — R609 Edema, unspecified: Secondary | ICD-10-CM | POA: Diagnosis not present

## 2013-08-30 DIAGNOSIS — B0229 Other postherpetic nervous system involvement: Secondary | ICD-10-CM

## 2013-08-30 NOTE — Progress Notes (Signed)
[facility] NOTE  RFV: routine follow up  Subjective:    Patient ID: Jeremy Reeves, male    DOB: [birth year], 72 y.o.   MRN: [id number]  HPI 72yo M with HIV, CD 4 count 260/VL<20, on tivicay/truvada. Complicated by obstructive uropathy, cr 1.4. Still has foley in place. He is doing well with medications. He states that lower extremity swelling is improved. Somewhat felt it was related to foley fastener on his leg was too tight. Has had some improvement with his compression stalkings. Still has nerve pain associated with right foot shingles. Otherwise, taking his meds as directed. No missing doses. No fever, chills, nightsweats   Current Outpatient Prescriptions on File Prior to Visit  Medication Sig Dispense Refill  . abacavir-lamiVUDine (EPZICOM) 600-300 MG per tablet Take 1 tablet by mouth daily.  30 tablet  11  . aspirin 81 MG tablet Take 81 mg by mouth daily.      Marland Kitchen atorvastatin (LIPITOR) 80 MG tablet Take 1 tablet (80 mg total) by mouth daily.  90 tablet  3  . carvedilol (COREG) 3.125 MG tablet Take 1 tablet (3.125 mg total) by mouth 2 (two) times daily with a meal.  180 tablet  3  . Cinnamon 500 MG capsule Take 1,000 mg by mouth daily.      . dapsone 100 MG tablet Take 1 tablet (100 mg total) by mouth daily.  30 tablet  5  . dolutegravir (TIVICAY) 50 MG tablet Take 1 tablet (50 mg total) by mouth daily.  30 tablet  11  . gabapentin (NEURONTIN) 300 MG capsule Take 1 capsule (300 mg total) by mouth 3 (three) times daily. May take additional dose in the afternoon if needed for pain relief.  90 capsule  2  . glucose blood test strip This is for contour meter  100 each  prn  . Lancets MISC This is for the contour  100 each  prn  . Multiple Vitamins-Minerals (MULTIVITAMIN WITH MINERALS) tablet Take 1 tablet by mouth daily.        . niacin (NIASPAN) 1000 MG CR tablet Take 2 tablets (2,000 mg total) by mouth at bedtime.  180 tablet  3  . omega-3 acid ethyl esters (LOVAZA) 1 G capsule Take  2 capsules (2 g total) by mouth 2 (two) times daily.  360 capsule  3  . omeprazole (PRILOSEC) 20 MG capsule Take 20 mg by mouth daily.      . tamsulosin (FLOMAX) 0.4 MG CAPS capsule Take 1 capsule (0.4 mg total) by mouth daily.  30 capsule  0  . vitamin E 400 UNIT capsule Take 400 Units by mouth daily.         No current facility-administered medications on file prior to visit.   Active Ambulatory Problems    Diagnosis Date Noted  . ASHD (arteriosclerotic heart disease) 03/18/2011  . BPH (benign prostatic hyperplasia) 03/18/2011  . Hyperlipidemia LDL goal <70 03/18/2011  . Diabetes mellitus 03/18/2011  . Hypertension associated with diabetes 01/25/2013  . HIV disease 05/24/2013  . Nausea with vomiting 07/04/2013  . Hypotension, unspecified 07/04/2013  . Hyponatremia 07/04/2013  . Acute renal failure 07/04/2013  . Obstructive uropathy 07/05/2013  . GERD (gastroesophageal reflux disease) 07/05/2013  . Herpes zoster 07/05/2013  . Diabetic neuropathy 08/09/2013   Resolved Ambulatory Problems    Diagnosis Date Noted  . No Resolved Ambulatory Problems   Past Medical History  Diagnosis Date  . Hyperlipidemia   . Hiatal hernia   .  HH (hiatus hernia)   . Type II diabetes mellitus   . HIV (human immunodeficiency virus infection) dx'd 2014    Review of Systems Review of Systems  Constitutional: Negative for fever, chills, diaphoresis, activity change, appetite change, fatigue and unexpected weight change.  HENT: Negative for congestion, sore throat, rhinorrhea, sneezing, trouble swallowing and sinus pressure.  Eyes: Negative for photophobia and visual disturbance.  Respiratory: Negative for cough, chest tightness, shortness of breath, wheezing and stridor.  Cardiovascular: Negative for chest pain, palpitations and leg swelling.  Gastrointestinal: Negative for nausea, vomiting, abdominal pain, diarrhea, constipation, blood in stool, abdominal distention and anal bleeding.   Genitourinary: Negative for dysuria, hematuria, flank pain and difficulty urinating.  Musculoskeletal: Negative for myalgias, back pain, joint swelling, arthralgias and gait problem.  Skin: Negative for color change, pallor, rash and wound.  Neurological: Numbness to right foot Hematological: Negative for adenopathy. Does not bruise/bleed easily.  Psychiatric/Behavioral: Negative for behavioral problems, confusion, sleep disturbance, dysphoric mood, decreased concentration and agitation.        Objective:   Physical Exam  BP 125/70  Pulse 82  Temp(Src) 97.7 F (36.5 C) (Oral)  Wt 178 lb (80.74 kg) gen = axo by 4 in NAD Skin = healing lesions on plntar aspect of right foot, onychomycosis of toenails Ext= trace edema bilaterally      Assessment & Plan:  HIv = continue with tivicay/epzicom. Had patient assistance card/PAM for tivicay/epzicom. 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

## 2013-09-09 ENCOUNTER — Encounter (HOSPITAL_COMMUNITY): Payer: Self-pay | Admitting: Emergency Medicine

## 2013-09-09 ENCOUNTER — Emergency Department (HOSPITAL_COMMUNITY)
Admission: EM | Admit: 2013-09-09 | Discharge: 2013-09-09 | Disposition: A | Discharge status: Home / Self Care | Payer: MEDICARE | Attending: Emergency Medicine | Admitting: Emergency Medicine

## 2013-09-09 DIAGNOSIS — Z21 Asymptomatic human immunodeficiency virus [HIV] infection status: Secondary | ICD-10-CM | POA: Insufficient documentation

## 2013-09-09 DIAGNOSIS — Z7982 Long term (current) use of aspirin: Secondary | ICD-10-CM | POA: Insufficient documentation

## 2013-09-09 DIAGNOSIS — N400 Enlarged prostate without lower urinary tract symptoms: Secondary | ICD-10-CM | POA: Diagnosis not present

## 2013-09-09 DIAGNOSIS — E785 Hyperlipidemia, unspecified: Secondary | ICD-10-CM | POA: Insufficient documentation

## 2013-09-09 DIAGNOSIS — E119 Type 2 diabetes mellitus without complications: Secondary | ICD-10-CM | POA: Diagnosis not present

## 2013-09-09 DIAGNOSIS — Z87891 Personal history of nicotine dependence: Secondary | ICD-10-CM | POA: Diagnosis not present

## 2013-09-09 DIAGNOSIS — I2510 Atherosclerotic heart disease of native coronary artery without angina pectoris: Secondary | ICD-10-CM | POA: Insufficient documentation

## 2013-09-09 DIAGNOSIS — Z466 Encounter for fitting and adjustment of urinary device: Secondary | ICD-10-CM | POA: Insufficient documentation

## 2013-09-09 DIAGNOSIS — R109 Unspecified abdominal pain: Secondary | ICD-10-CM | POA: Insufficient documentation

## 2013-09-09 DIAGNOSIS — Z79899 Other long term (current) drug therapy: Secondary | ICD-10-CM | POA: Diagnosis not present

## 2013-09-09 DIAGNOSIS — K219 Gastro-esophageal reflux disease without esophagitis: Secondary | ICD-10-CM | POA: Diagnosis not present

## 2013-09-09 DIAGNOSIS — T83091A Other mechanical complication of indwelling urethral catheter, initial encounter: Secondary | ICD-10-CM | POA: Diagnosis not present

## 2013-09-09 DIAGNOSIS — T839XXA Unspecified complication of genitourinary prosthetic device, implant and graft, initial encounter: Secondary | ICD-10-CM

## 2013-09-09 NOTE — ED Provider Notes (Signed)
Medical screening examination/treatment/procedure(s) were performed by non-physician practitioner and as supervising physician I was immediately available for consultation/collaboration.  EKG Interpretation   None         Benny Lennert, MD 09/09/13 [phone]

## 2013-09-09 NOTE — ED Provider Notes (Signed)
CSN: [id number]     Arrival date & time 09/09/13  1252 History   First MD Initiated Contact with Patient 09/09/13 1306     Chief Complaint  Patient presents with  . Urinary Retention   (Consider location/radiation/quality/duration/timing/severity/associated sxs/prior Treatment) The history is provided by the patient and medical records.   This is a 72 y.o. Male with PMH significant for BPH, GERD, HIV, DM2, presenting to the ED for urinary retention.  Pt has indwelling foley catheter and states that throughout the day today he has had decreased urine output.  Notes a few small clot appearing fragments in his leg bag.  On arrival, has a pressure sensation in his suprapubic region.  No associated nausea, vomiting, or diarrhea.  No fevers, sweats, or chills.  Past Medical History  Diagnosis Date  . ASHD (arteriosclerotic heart disease)   . Hyperlipidemia   . BPH (benign prostatic hyperplasia)   . Hiatal hernia   . HH (hiatus hernia)   . Type II diabetes mellitus   . HIV (human immunodeficiency virus infection) dx'd 2014  . GERD (gastroesophageal reflux disease)    Past Surgical History  Procedure Laterality Date  . Colonoscopy  2004  . 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 Cardiac catheterization  2001   History reviewed. No pertinent family history. History  Substance Use Topics  . Smoking status: Former Smoker -- 0.12 packs/day for 40 years    Types: Cigarettes  . Smokeless tobacco: Never Used     Comment: 07/04/2013 "I quit smoking before 2000"  . Alcohol Use: No    Review of Systems  Genitourinary: Positive for decreased urine volume.  All other systems reviewed and are negative.    Allergies  Peanut-containing drug products  Home Medications   Current Outpatient Rx  Name  Route  Sig  Dispense  Refill  . abacavir-lamiVUDine (EPZICOM) 600-300 MG per tablet   Oral   Take 1 tablet by mouth daily.   30 tablet   11   . aspirin 81 MG  tablet   Oral   Take 81 mg by mouth daily.         Marland Kitchen atorvastatin (LIPITOR) 80 MG tablet   Oral   Take 1 tablet (80 mg total) by mouth daily.   90 tablet   3   . carvedilol (COREG) 3.125 MG tablet   Oral   Take 1 tablet (3.125 mg total) by mouth 2 (two) times daily with a meal.   180 tablet   3   . Cinnamon 500 MG capsule   Oral   Take 1,000 mg by mouth daily.         . dapsone 100 MG tablet   Oral   Take 1 tablet (100 mg total) by mouth daily.   30 tablet   5   . dolutegravir (TIVICAY) 50 MG tablet   Oral   Take 1 tablet (50 mg total) by mouth daily.   30 tablet   11   . gabapentin (NEURONTIN) 300 MG capsule   Oral   Take 1 capsule (300 mg total) by mouth 3 (three) times daily. May take additional dose in the afternoon if needed for pain relief.   90 capsule   2   . glucose blood test strip      This is for contour meter   100 each   prn   . Lancets MISC      This is for  the contour   100 each   prn   . Multiple Vitamins-Minerals (MULTIVITAMIN WITH MINERALS) tablet   Oral   Take 1 tablet by mouth daily.           . niacin (NIASPAN) 1000 MG CR tablet   Oral   Take 2 tablets (2,000 mg total) by mouth at bedtime.   180 tablet   3   . omega-3 acid ethyl esters (LOVAZA) 1 G capsule   Oral   Take 2 capsules (2 g total) by mouth 2 (two) times daily.   360 capsule   3   . omeprazole (PRILOSEC) 20 MG capsule   Oral   Take 20 mg by mouth daily.         . tamsulosin (FLOMAX) 0.4 MG CAPS capsule   Oral   Take 1 capsule (0.4 mg total) by mouth daily.   30 capsule   0   . vitamin E 400 UNIT capsule   Oral   Take 400 Units by mouth daily.            BP 155/91  Pulse 86  Temp(Src) 97.3 F (36.3 C) (Oral)  Resp 16  SpO2 96%  Physical Exam  Nursing note and vitals reviewed. Constitutional: He is oriented to person, place, and time. He appears well-developed and well-nourished. No distress.  HENT:  Head: Normocephalic and atraumatic.   Mouth/Throat: Oropharynx is clear and moist.  Eyes: Conjunctivae and EOM are normal. Pupils are equal, round, and reactive to light.  Neck: Normal range of motion. Neck supple.  Cardiovascular: Normal rate, regular rhythm and normal heart sounds.   Pulmonary/Chest: Effort normal and breath sounds normal. No respiratory distress. He has no wheezes.  Abdominal: Soft. Bowel sounds are normal. There is no tenderness. There is no guarding.  Genitourinary:  Foley catheter in place; minimal urine output in leg bag  Musculoskeletal: Normal range of motion.  Neurological: He is alert and oriented to person, place, and time.  Skin: Skin is warm and dry. He is not diaphoretic.  Psychiatric: He has a normal mood and affect.    ED Course  Procedures (including critical care time) Labs Review Labs Reviewed - No data to display Imaging Review No results found.  EKG Interpretation   None       MDM   1. Foley catheter problem, initial encounter    Foley catheter was irrigated with small clot removed, 550 cc urine returned.  Catheter continues to drain well and abdominal pressure sensation has resolved.  Will discharge.  Pt has previously scheduled FU with his urologist tomorrow.  Discussed plan with pt, he agreed.  Return precautions advised.  Garlon Hatchet, PA-C 09/09/13 1349

## 2013-09-09 NOTE — ED Notes (Signed)
Foley cath irrigated with 20cc NS with return of 550 ml amber colored urine. Patient had a small clot.

## 2013-09-09 NOTE — ED Notes (Signed)
Pt has had catheter x 2months, scheduled for dr tomorrow for removal to see if he can urinate, now c/o pain, catheter not draining, leaking

## 2013-09-10 DIAGNOSIS — R339 Retention of urine, unspecified: Secondary | ICD-10-CM | POA: Diagnosis not present

## 2013-09-20 ENCOUNTER — Other Ambulatory Visit: Payer: MEDICARE

## 2013-09-20 DIAGNOSIS — B20 Human immunodeficiency virus [HIV] disease: Secondary | ICD-10-CM

## 2013-09-20 LAB — CBC WITH DIFFERENTIAL/PLATELET
Basophils Absolute: 0 10*3/uL (ref 0.0–0.1)
Basophils Relative: 1 % (ref 0–1)
Eosinophils Absolute: 0.2 10*3/uL (ref 0.0–0.7)
Lymphocytes Relative: 23 % (ref 12–46)
Lymphs Abs: 1 10*3/uL (ref 0.7–4.0)
Neutrophils Relative %: 63 % (ref 43–77)
Platelets: 131 10*3/uL — ABNORMAL LOW (ref 150–400)
RBC: 4.56 MIL/uL (ref 4.22–5.81)
RDW: 16.2 % — ABNORMAL HIGH (ref 11.5–15.5)
WBC: 4.2 10*3/uL (ref 4.0–10.5)

## 2013-09-20 LAB — COMPLETE METABOLIC PANEL WITH GFR
ALT: 31 U/L (ref 0–53)
AST: 28 U/L (ref 0–37)
CO2: 27 mEq/L (ref 19–32)
Chloride: 104 mEq/L (ref 96–112)
Creat: 1.24 mg/dL (ref 0.50–1.35)
GFR, Est African American: 67 mL/min
GFR, Est Non African American: 58 mL/min — ABNORMAL LOW
Glucose, Bld: 130 mg/dL — ABNORMAL HIGH (ref 70–99)
Sodium: 139 mEq/L (ref 135–145)
Total Bilirubin: 0.5 mg/dL (ref 0.3–1.2)
Total Protein: 6.8 g/dL (ref 6.0–8.3)

## 2013-09-21 LAB — T-HELPER CELL (CD4) - (RCID CLINIC ONLY)
CD4 % Helper T Cell: 37 % (ref 33–55)
CD4 T Cell Abs: 350 /uL — ABNORMAL LOW (ref 400–2700)

## 2013-09-24 LAB — HIV-1 RNA QUANT-NO REFLEX-BLD: HIV-1 RNA Quant, Log: <1.3 {Log} (ref <1.30)

## 2013-09-26 ENCOUNTER — Encounter: Payer: Self-pay | Admitting: Internal Medicine

## 2013-09-26 ENCOUNTER — Ambulatory Visit (INDEPENDENT_AMBULATORY_CARE_PROVIDER_SITE_OTHER): Payer: MEDICARE | Admitting: Internal Medicine

## 2013-09-26 VITALS — BP 151/90 | HR 76 | Temp 98.1°F | Wt 180.0 lb

## 2013-09-26 DIAGNOSIS — N182 Chronic kidney disease, stage 2 (mild): Secondary | ICD-10-CM

## 2013-09-26 DIAGNOSIS — B0229 Other postherpetic nervous system involvement: Secondary | ICD-10-CM

## 2013-09-26 DIAGNOSIS — B20 Human immunodeficiency virus [HIV] disease: Secondary | ICD-10-CM | POA: Diagnosis not present

## 2013-09-26 NOTE — Progress Notes (Signed)
Subjective:    Patient ID: Jeremy Reeves, male    DOB: [birth year], 72 y.o.   MRN: [id number]  HPI 71yo M with HIV, CD 4 count of 350/VL<20, on tivicay/epzicom. Complicated by post-herpetic neuralgia, peripheral neuropathy. Urinary obstruction has foley catheter in place, and CKD improved, GFR 67. The patient continues to take his meds. Doing well. Still having right foot pain. He wears compression stalking has helped minimize swelling of legs but still has not alleviated the pain in his right dorsum of foot and medial aspect of sole.  He is seeing urology tomorrow. He did a trial of removing catheter but still had urinary retention/obstruction, which he had the urinary replaced. Tomorrow to discuss if need for TURP.  Current Outpatient Prescriptions on File Prior to Visit  Medication Sig Dispense Refill  . abacavir-lamiVUDine (EPZICOM) 600-300 MG per tablet Take 1 tablet by mouth daily.  30 tablet  11  . atorvastatin (LIPITOR) 80 MG tablet Take 1 tablet (80 mg total) by mouth daily.  90 tablet  3  . carvedilol (COREG) 3.125 MG tablet Take 1 tablet (3.125 mg total) by mouth 2 (two) times daily with a meal.  180 tablet  3  . Cinnamon 500 MG capsule Take 1,000 mg by mouth daily.      . dolutegravir (TIVICAY) 50 MG tablet Take 1 tablet (50 mg total) by mouth daily.  30 tablet  11  . gabapentin (NEURONTIN) 300 MG capsule Take 1 capsule (300 mg total) by mouth 3 (three) times daily. May take additional dose in the afternoon if needed for pain relief.  90 capsule  2  . Multiple Vitamins-Minerals (MULTIVITAMIN WITH MINERALS) tablet Take 1 tablet by mouth daily.        . niacin (NIASPAN) 1000 MG CR tablet Take 2 tablets (2,000 mg total) by mouth at bedtime.  180 tablet  3  . omeprazole (PRILOSEC) 20 MG capsule Take 20 mg by mouth daily.      . phenazopyridine (PYRIDIUM) 200 MG tablet Take 200 mg by mouth 3 (three) times daily as needed for pain.      . tamsulosin (FLOMAX) 0.4 MG CAPS capsule Take 1  capsule (0.4 mg total) by mouth daily.  30 capsule  0  . vitamin E 400 UNIT capsule Take 400 Units by mouth daily.         No current facility-administered medications on file prior to visit.   Active Ambulatory Problems    Diagnosis Date Noted  . ASHD (arteriosclerotic heart disease) 03/18/2011  . BPH (benign prostatic hyperplasia) 03/18/2011  . Hyperlipidemia LDL goal <70 03/18/2011  . Diabetes mellitus 03/18/2011  . Hypertension associated with diabetes 01/25/2013  . HIV disease 05/24/2013  . Nausea with vomiting 07/04/2013  . Hypotension, unspecified 07/04/2013  . Hyponatremia 07/04/2013  . Acute renal failure 07/04/2013  . Obstructive uropathy 07/05/2013  . GERD (gastroesophageal reflux disease) 07/05/2013  . Herpes zoster 07/05/2013  . Diabetic neuropathy 08/09/2013   Resolved Ambulatory Problems    Diagnosis Date Noted  . No Resolved Ambulatory Problems   Past Medical History  Diagnosis Date  . Hyperlipidemia   . Hiatal hernia   . HH (hiatus hernia)   . Type II diabetes mellitus   . HIV (human immunodeficiency virus infection) dx'd 2014      Review of Systems     Objective:   Physical Exam BP 151/90  Pulse 76  Temp(Src) 98.1 F (36.7 C) (Oral)  Wt 180 lb (81.647 kg)  Physical Exam  Constitutional: He is oriented to person, place, and time. He appears well-developed and well-nourished. No distress.  HENT:  Mouth/Throat: Oropharynx is clear and moist. No oropharyngeal exudate.  Cardiovascular: Normal rate, regular rhythm and normal heart sounds. Exam reveals no gallop and no friction rub.  No murmur heard.  Pulmonary/Chest: Effort normal and breath sounds normal. No respiratory distress. He has no wheezes.  Abdominal: Soft. Bowel sounds are normal. He exhibits no distension. There is no tenderness.  Lymphadenopathy:  He has no cervical adenopathy.  Neurological: He is alert and oriented to person, place, and time.  Skin: Skin is warm and dry. No rash  noted. No erythema.  Psychiatric: He has a normal mood and affect. His behavior is normal.        Assessment & Plan:  hiv = well controlled, continue with tivicay/epzicom  Post herpetic neuralgia = not well controlled; we will switch to lyrica BID nad gave instruction to taper neurontin from 300mg  TID -> 300mg  BID x 3 day, then 300mg  QD x 3 day, then off. We will refer To neurology if no improvement in a week.  ckd = back to baseline, cr 1.25. Continue to monitor.  Urinary obstruction = urology to evaluate tomorrow.  rtc 6 wk

## 2013-09-27 DIAGNOSIS — N139 Obstructive and reflux uropathy, unspecified: Secondary | ICD-10-CM | POA: Diagnosis not present

## 2013-09-27 DIAGNOSIS — N401 Enlarged prostate with lower urinary tract symptoms: Secondary | ICD-10-CM | POA: Diagnosis not present

## 2013-09-28 ENCOUNTER — Other Ambulatory Visit: Payer: Self-pay | Admitting: Urology

## 2013-10-04 ENCOUNTER — Telehealth: Payer: Self-pay | Admitting: Licensed Clinical Social Worker

## 2013-10-04 NOTE — Telephone Encounter (Signed)
Patient states that the lyrica he started last week is not working, still having pain.  FYI patient having prostate surgery on 10/24/12

## 2013-10-15 ENCOUNTER — Encounter: Payer: Self-pay | Admitting: Family Medicine

## 2013-10-15 ENCOUNTER — Ambulatory Visit (INDEPENDENT_AMBULATORY_CARE_PROVIDER_SITE_OTHER): Payer: MEDICARE | Admitting: Family Medicine

## 2013-10-15 VITALS — BP 120/80 | HR 68 | Wt 184.0 lb

## 2013-10-15 DIAGNOSIS — B0229 Other postherpetic nervous system involvement: Secondary | ICD-10-CM | POA: Diagnosis not present

## 2013-10-15 MED ORDER — PREGABALIN 100 MG PO CAPS: 100.0000 mg | ORAL_CAPSULE | Freq: Two times a day (BID) | ORAL | Status: DC

## 2013-10-15 NOTE — Patient Instructions (Signed)
Take this medicine twice per day and call me in one week to let me know how you're doing

## 2013-10-15 NOTE — Progress Notes (Signed)
   Subjective:    Patient ID: Jeremy Reeves, male    DOB: 1941/06/30, 72 y.o.   MRN: [id number]  HPI He is here for followup on postherpetic neuralgia. He did have shingles on the right and a L4 distribution. He had been tried on Lyrica and then switched to Neurontin and then apparently switched back to Lyrica. Presently he is on 75 mg twice a day and still having significant amount of pain.   Review of Systems     Objective:   Physical Exam Exam of the right foot does show evidence of hyperpigmentation in the L4 distribution pattern with tenderness to palpation.       Assessment & Plan:  Postherpetic neuralgia - Plan: pregabalin (LYRICA) 100 MG capsule  I will have him increase to 100 mg twice a day and call me in one week. If no improvement I will increase him to 3 times a day dosing.

## 2013-10-17 ENCOUNTER — Encounter (HOSPITAL_COMMUNITY): Payer: Self-pay | Admitting: Pharmacy Technician

## 2013-10-19 ENCOUNTER — Other Ambulatory Visit (HOSPITAL_COMMUNITY): Payer: Self-pay | Admitting: Urology

## 2013-10-19 NOTE — Patient Instructions (Addendum)
20 Jeremy Reeves  10/19/2013   Your procedure is scheduled on:  10/25/13  THURSDAY  Report to [place] at  0830     AM.  Call this number if you have problems the morning of surgery: [phone]       Remember:   Do not eat food  Or drink :After Midnight. Wednesday NIGHT   Take these medicines the morning of surgery with A SIP OF WATER: CARVEDILOL,  OMEPRAZOLE, LYRICA   .  Contacts, dentures or partial plates can not be worn to surgery  Leave suitcase in the car. After surgery it may be brought to your room.  For patients admitted to the hospital, checkout time is 11:00 AM day of  discharge.             SPECIAL INSTRUCTIONS- SEE [facility] PREPARING FOR SURGERY INSTRUCTION SHEET-     DO NOT WEAR JEWELRY, LOTIONS, POWDERS, OR PERFUMES.  WOMEN-- DO NOT SHAVE LEGS OR UNDERARMS FOR 12 HOURS BEFORE SHOWERS. MEN MAY SHAVE FACE.  Patients discharged the day of surgery will not be allowed to drive home. IF going home the day of surgery, you must have a driver and someone to stay with you for the first 24 hours  Name and phone number of your driver:   Wife  Brooks Sailors  PST [phone]  [phone]                 FAILURE TO FOLLOW THESE INSTRUCTIONS MAY RESULT IN  CANCELLATION   OF YOUR SURGERY                                                  Patient Signature _____________________________

## 2013-10-19 NOTE — Progress Notes (Signed)
EKG 9/14 EPIC 

## 2013-10-23 ENCOUNTER — Encounter (HOSPITAL_COMMUNITY): Payer: Self-pay | Admitting: *Deleted

## 2013-10-23 ENCOUNTER — Ambulatory Visit (HOSPITAL_COMMUNITY)
Admission: RE | Admit: 2013-10-23 | Discharge: 2013-10-23 | Disposition: A | Discharge status: Home / Self Care | Payer: MEDICARE | Source: Ambulatory Visit | Attending: Urology | Admitting: Urology

## 2013-10-23 ENCOUNTER — Encounter (HOSPITAL_COMMUNITY)
Admission: RE | Admit: 2013-10-23 | Discharge: 2013-10-23 | Disposition: A | Discharge status: Home / Self Care | Payer: MEDICARE | Source: Ambulatory Visit | Attending: Urology | Admitting: Urology

## 2013-10-23 ENCOUNTER — Encounter (INDEPENDENT_AMBULATORY_CARE_PROVIDER_SITE_OTHER): Payer: Self-pay

## 2013-10-23 ENCOUNTER — Encounter (HOSPITAL_COMMUNITY): Payer: Self-pay

## 2013-10-23 DIAGNOSIS — N138 Other obstructive and reflux uropathy: Secondary | ICD-10-CM | POA: Insufficient documentation

## 2013-10-23 DIAGNOSIS — Z951 Presence of aortocoronary bypass graft: Secondary | ICD-10-CM | POA: Diagnosis not present

## 2013-10-23 DIAGNOSIS — Z01818 Encounter for other preprocedural examination: Secondary | ICD-10-CM | POA: Diagnosis not present

## 2013-10-23 DIAGNOSIS — N401 Enlarged prostate with lower urinary tract symptoms: Secondary | ICD-10-CM | POA: Diagnosis not present

## 2013-10-23 DIAGNOSIS — Z01812 Encounter for preprocedural laboratory examination: Secondary | ICD-10-CM | POA: Diagnosis not present

## 2013-10-23 DIAGNOSIS — I771 Stricture of artery: Secondary | ICD-10-CM | POA: Insufficient documentation

## 2013-10-23 DIAGNOSIS — R339 Retention of urine, unspecified: Secondary | ICD-10-CM | POA: Insufficient documentation

## 2013-10-23 HISTORY — DX: Presence of urogenital implants: Z96.0

## 2013-10-23 HISTORY — DX: Zoster without complications: B02.9

## 2013-10-23 HISTORY — DX: Essential (primary) hypertension: I10

## 2013-10-23 LAB — BASIC METABOLIC PANEL
BUN: 15 mg/dL (ref 6–23)
CALCIUM: 9.6 mg/dL (ref 8.4–10.5)
CO2: 28 mEq/L (ref 19–32)
Chloride: 101 mEq/L (ref 96–112)
Creatinine, Ser: 1.41 mg/dL — ABNORMAL HIGH (ref 0.50–1.35)
GFR calc Af Amer: 56 mL/min — ABNORMAL LOW (ref >90)
GFR, EST NON AFRICAN AMERICAN: 49 mL/min — AB (ref >90)
Glucose, Bld: 97 mg/dL (ref 70–99)
Potassium: 4.2 mEq/L (ref 3.7–5.3)
Sodium: 140 mEq/L (ref 137–147)

## 2013-10-23 LAB — CBC
HCT: 44.7 % (ref 39.0–52.0)
Hemoglobin: 15.2 g/dL (ref 13.0–17.0)
MCH: 28.6 pg (ref 26.0–34.0)
MCHC: 34 g/dL (ref 30.0–36.0)
MCV: 84.2 fL (ref 78.0–100.0)
PLATELETS: 106 10*3/uL — AB (ref 150–400)
RBC: 5.31 MIL/uL (ref 4.22–5.81)
RDW: 13.7 % (ref 11.5–15.5)
WBC: 4.4 10*3/uL (ref 4.0–10.5)

## 2013-10-23 NOTE — Progress Notes (Signed)
Anesthesia record  1999 from heart surgery on chart, LOv Dr Gwenlyn Found 3/14 chart

## 2013-10-23 NOTE — Progress Notes (Signed)
CBC, BMET faxed to Dr Jeffie Pollock by [facility]

## 2013-10-24 NOTE — H&P (Signed)
ctive Problems Problems   1. Benign prostatic hyperplasia with urinary obstruction (600.01,599.69)  2. 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 Epididymitis, right (604.90)  4. 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 Urinary retention (788.20)  History of Present Illness  Mr. Cona returns today in f/u.  He has BPH with BOO and had retention with shingles.  He had UDS in October that showed a hypotonic bladder with instability and some obstruction.    He is here today to discuss surgical option.   Past Medical History Problems   1. History of cardiac disorder (V12.50)  2. History of esophageal reflux (V12.79)  3. History of HIV infection, symptomatic (042)  4. History of HIV infection, symptomatic (042)  Surgical History Problems   1. History of CABG (CABG)  Current Meds  1. Aspirin 81 MG Oral Tablet;  Therapy: (Recorded:24Sep2014) to Recorded  2. Atorvastatin Calcium 80 MG Oral Tablet;  Therapy: [phone] to Recorded  3. Carvedilol 3.125 MG Oral Tablet;  Therapy: [phone] to Recorded  4. Dapsone 100 MG Oral Tablet;  Therapy: [phone] to Recorded  5. Epzicom 600-300 MG Oral Tablet;  Therapy: 03Sep2014 to Recorded  6. Lovaza 1 GM Oral Capsule;  Therapy: (Recorded:24Sep2014) to Recorded  7. Lyrica 75 MG Oral Capsule;  Therapy: [phone] to Recorded  8. Multi-Vitamin TABS;  Therapy: (Recorded:24Sep2014) to Recorded  9. Niaspan 1000 MG Oral Tablet Extended Release;  Therapy: (Recorded:24Sep2014) to Recorded  10. Omeprazole 20 MG Oral Capsule Delayed Release;   Therapy: [phone] to Recorded  11. Phenazopyridine HCl - 200 MG Oral Tablet; 1TIDPRN - TAKE ONE TABLET BY MOUTH   THREE TIMES DAILY AS NEEDED BURNING;   Therapy: [id number] to (Last Rx:21Nov2014)  Requested for: 21Nov2014 Ordered  12. Tamsulosin HCl - 0.4 MG Oral Capsule;   Therapy: [id number] to Recorded  13. Tivicay 50 MG Oral Tablet;   Therapy: 03Sep2014  to Recorded  14. Vytorin 10-40 MG Oral Tablet;   Therapy: (Recorded:24Sep2014) to Recorded  Allergies Medication   1. No Known Drug Allergies Non-Medication   2. Peanuts  Family History Problems   1. Family history of Death In The Family Father  2. Family history of Death In The Family Mother  3. Family history of Family Health Status Number Of Children   1 son  4. Family history of Nonfunctioning Kidney : Mother  Social History Problems   1. Denied: History of Alcohol Use  2. Denied: History of Caffeine Use  3. Former smoker [place])   smoked 1/2 ppd for 80yrs quit 20+yrs ago  4. Marital History - Currently Married  5. Retired From Work  Review of Systems  Constitutional: no fever.  Cardiovascular: no chest pain.  Respiratory: no shortness of breath.  Musculoskeletal: limb pain (right foot and leg from the post herpetic neuralgia. ).    Physical Exam Constitutional: Well nourished and well developed . No acute distress.  Pulmonary: No respiratory distress and normal respiratory rhythm and effort.  Cardiovascular: Heart rate and rhythm are normal . No peripheral edema.    Results/Data  Flow Rate: Instilled volume 250 ml . Voided 18 ml. A peak flow rate of and insufficient volume. flow curve .    Procedure  Procedure: Cystoscopy   Indication: Lower Urinary Tract Symptoms.  Informed Consent: Risks, benefits, and potential adverse events were discussed and informed consent was obtained from the patient.  Prep: The patient was prepped with betadine.  Antibiotic prophylaxis: Ciprofloxacin.  Procedure Note:  Urethral meatus:. No abnormalities.  Anterior urethra: No abnormalities.  Prostatic urethra: No abnormalities . Estimated length was 3 cm. There was visual obstruction of the prostatic urethra. The lateral prostatic lobes were enlarged. No intravesical median lobe was visualized.  Bladder: Visulization was clear. The ureteral orifices were in the normal anatomic  position bilaterally and had clear efflux of urine. A systematic survey of the bladder demonstrated no bladder tumors or stones. Examination of the bladder demonstrated moderate trabeculation erythematous mucosa (catheter irritation and edema on the lateral walls. ). 73fr foley catheter was reinserted after he was able to void only 18cc of the 250cc instilled. The patient tolerated the procedure well.  Complications: None.    Assessment Assessed   1. Benign prostatic hyperplasia with urinary obstruction (600.01,599.69)  2. Urinary retention (788.20)   He has BPH with BOO and retention related to shingles neuropathy.   Plan Benign prostatic hyperplasia with urinary obstruction   1. Start: Ciprofloxacin HCl - 500 MG Oral Tablet; take 1 po bid starting 3 days prior to  procedure  2. Follow-up Schedule Surgery Office  Follow-up  Status: Complete  Done: [id number]  3. Cath, simple, wIinsert Temp Cath; Status:Complete;   Done: [id number]  4. Complex Uroflowmetry; Status:Complete;   Done: [id number]  5. Cysto; Status:Complete;   Done: [id number]   The foley was replaced and I discussed the options for therapy. I am going to get him setup for a Gyrus button TURP and reviewed the risks of bleeding, infection, stricture, incontinence, sexual and ejaculatory issues, persistent retention, thrombotic events and anesthetic complications. I will have him start cipro 3 days prior.   Discussion/Summary   CC: Dr. Jill Alexanders.

## 2013-10-25 ENCOUNTER — Ambulatory Visit (HOSPITAL_COMMUNITY)
Admission: RE | Admit: 2013-10-25 | Discharge: 2013-10-25 | Disposition: A | Discharge status: Home / Self Care | Payer: MEDICARE | Source: Ambulatory Visit | Attending: Urology | Admitting: Urology

## 2013-10-25 ENCOUNTER — Encounter (HOSPITAL_COMMUNITY)
Admission: RE | Disposition: A | Discharge status: Home / Self Care | Payer: Self-pay | Source: Ambulatory Visit | Attending: Urology

## 2013-10-25 ENCOUNTER — Encounter (HOSPITAL_COMMUNITY): Payer: MEDICARE | Admitting: Anesthesiology

## 2013-10-25 ENCOUNTER — Encounter (HOSPITAL_COMMUNITY): Payer: Self-pay | Admitting: *Deleted

## 2013-10-25 ENCOUNTER — Ambulatory Visit (HOSPITAL_COMMUNITY): Payer: MEDICARE | Admitting: Anesthesiology

## 2013-10-25 DIAGNOSIS — N401 Enlarged prostate with lower urinary tract symptoms: Secondary | ICD-10-CM | POA: Diagnosis not present

## 2013-10-25 DIAGNOSIS — I2510 Atherosclerotic heart disease of native coronary artery without angina pectoris: Secondary | ICD-10-CM | POA: Diagnosis not present

## 2013-10-25 DIAGNOSIS — I10 Essential (primary) hypertension: Secondary | ICD-10-CM | POA: Insufficient documentation

## 2013-10-25 DIAGNOSIS — N138 Other obstructive and reflux uropathy: Secondary | ICD-10-CM | POA: Insufficient documentation

## 2013-10-25 DIAGNOSIS — K219 Gastro-esophageal reflux disease without esophagitis: Secondary | ICD-10-CM | POA: Insufficient documentation

## 2013-10-25 DIAGNOSIS — R339 Retention of urine, unspecified: Secondary | ICD-10-CM | POA: Diagnosis not present

## 2013-10-25 DIAGNOSIS — Z951 Presence of aortocoronary bypass graft: Secondary | ICD-10-CM | POA: Insufficient documentation

## 2013-10-25 DIAGNOSIS — Z7982 Long term (current) use of aspirin: Secondary | ICD-10-CM | POA: Insufficient documentation

## 2013-10-25 DIAGNOSIS — N312 Flaccid neuropathic bladder, not elsewhere classified: Secondary | ICD-10-CM | POA: Diagnosis not present

## 2013-10-25 DIAGNOSIS — N139 Obstructive and reflux uropathy, unspecified: Secondary | ICD-10-CM | POA: Diagnosis present

## 2013-10-25 DIAGNOSIS — N318 Other neuromuscular dysfunction of bladder: Secondary | ICD-10-CM | POA: Insufficient documentation

## 2013-10-25 DIAGNOSIS — B0223 Postherpetic polyneuropathy: Secondary | ICD-10-CM | POA: Insufficient documentation

## 2013-10-25 DIAGNOSIS — N320 Bladder-neck obstruction: Secondary | ICD-10-CM | POA: Diagnosis not present

## 2013-10-25 DIAGNOSIS — N400 Enlarged prostate without lower urinary tract symptoms: Secondary | ICD-10-CM | POA: Diagnosis present

## 2013-10-25 DIAGNOSIS — Z21 Asymptomatic human immunodeficiency virus [HIV] infection status: Secondary | ICD-10-CM | POA: Insufficient documentation

## 2013-10-25 DIAGNOSIS — Z79899 Other long term (current) drug therapy: Secondary | ICD-10-CM | POA: Diagnosis not present

## 2013-10-25 DIAGNOSIS — N453 Epididymo-orchitis: Secondary | ICD-10-CM | POA: Insufficient documentation

## 2013-10-25 HISTORY — PX: TRANSURETHRAL RESECTION OF PROSTATE: SHX73

## 2013-10-25 HISTORY — DX: Failed or difficult intubation, initial encounter: T88.4XXA

## 2013-10-25 LAB — GLUCOSE, CAPILLARY
GLUCOSE-CAPILLARY: 102 mg/dL — AB (ref 70–99)
Glucose-Capillary: 126 mg/dL — ABNORMAL HIGH (ref 70–99)

## 2013-10-25 SURGERY — TRANSURETHRAL RESECTION OF THE PROSTATE WITH GYRUS INSTRUMENTS
Anesthesia: General | Site: Prostate

## 2013-10-25 MED ORDER — OXYCODONE HCL 5 MG PO TABS: 5.0000 mg | ORAL_TABLET | ORAL | Status: DC | PRN

## 2013-10-25 MED ORDER — SODIUM CHLORIDE 0.9 % IJ SOLN: 3.0000 mL | Freq: Two times a day (BID) | INTRAMUSCULAR | Status: DC

## 2013-10-25 MED ORDER — PROPOFOL 10 MG/ML IV BOLUS
INTRAVENOUS | Status: DC | PRN
  Administered 2013-10-25: 200 mg via INTRAVENOUS

## 2013-10-25 MED ORDER — HYDROCODONE-ACETAMINOPHEN 5-325 MG PO TABS: 1.0000 | ORAL_TABLET | Freq: Four times a day (QID) | ORAL | Status: DC | PRN

## 2013-10-25 MED ORDER — 0.9 % SODIUM CHLORIDE (POUR BTL) OPTIME
TOPICAL | Status: DC | PRN
  Administered 2013-10-25: 1000 mL

## 2013-10-25 MED ORDER — FENTANYL CITRATE 0.05 MG/ML IJ SOLN: 25.0000 ug | INTRAMUSCULAR | Status: DC | PRN

## 2013-10-25 MED ORDER — ONDANSETRON HCL 4 MG/2ML IJ SOLN
INTRAMUSCULAR | Status: DC | PRN
  Administered 2013-10-25: 4 mg via INTRAVENOUS

## 2013-10-25 MED ORDER — ONDANSETRON HCL 4 MG/2ML IJ SOLN
INTRAMUSCULAR | Status: AC
  Filled 2013-10-25: qty 2

## 2013-10-25 MED ORDER — ACETAMINOPHEN 325 MG PO TABS: 650.0000 mg | ORAL_TABLET | ORAL | Status: DC | PRN

## 2013-10-25 MED ORDER — LACTATED RINGERS IV SOLN: INTRAVENOUS | Status: DC

## 2013-10-25 MED ORDER — CIPROFLOXACIN IN D5W 400 MG/200ML IV SOLN
400.0000 mg | INTRAVENOUS | Status: AC
Start: 2013-10-25 — End: 2013-10-25
  Administered 2013-10-25: 400 mg via INTRAVENOUS

## 2013-10-25 MED ORDER — PROMETHAZINE HCL 25 MG/ML IJ SOLN: 6.2500 mg | INTRAMUSCULAR | Status: DC | PRN

## 2013-10-25 MED ORDER — ACETAMINOPHEN 650 MG RE SUPP: 650.0000 mg | RECTAL | Status: DC | PRN

## 2013-10-25 MED ORDER — CIPROFLOXACIN IN D5W 400 MG/200ML IV SOLN
INTRAVENOUS | Status: AC
  Filled 2013-10-25: qty 200

## 2013-10-25 MED ORDER — SODIUM CHLORIDE 0.9 % IR SOLN
Status: DC | PRN
  Administered 2013-10-25: 15000 mL

## 2013-10-25 MED ORDER — LIDOCAINE HCL (PF) 2 % IJ SOLN
INTRAMUSCULAR | Status: DC | PRN
  Administered 2013-10-25: 75 mg via INTRADERMAL

## 2013-10-25 MED ORDER — LACTATED RINGERS IV SOLN
INTRAVENOUS | Status: DC | PRN
  Administered 2013-10-25: 11:00:00 via INTRAVENOUS

## 2013-10-25 MED ORDER — PROPOFOL 10 MG/ML IV BOLUS
INTRAVENOUS | Status: AC
  Filled 2013-10-25: qty 20

## 2013-10-25 MED ORDER — FENTANYL CITRATE 0.05 MG/ML IJ SOLN
INTRAMUSCULAR | Status: DC | PRN
  Administered 2013-10-25 (×3): 50 ug via INTRAVENOUS

## 2013-10-25 MED ORDER — SODIUM CHLORIDE 0.9 % IV SOLN: 250.0000 mL | INTRAVENOUS | Status: DC | PRN

## 2013-10-25 MED ORDER — LIDOCAINE HCL 2 % EX GEL
CUTANEOUS | Status: AC
  Filled 2013-10-25: qty 10

## 2013-10-25 MED ORDER — MEPERIDINE HCL 50 MG/ML IJ SOLN: 6.2500 mg | INTRAMUSCULAR | Status: DC | PRN

## 2013-10-25 MED ORDER — BELLADONNA ALKALOIDS-OPIUM 16.2-60 MG RE SUPP
RECTAL | Status: AC
  Filled 2013-10-25: qty 1

## 2013-10-25 MED ORDER — FENTANYL CITRATE 0.05 MG/ML IJ SOLN
INTRAMUSCULAR | Status: AC
  Filled 2013-10-25: qty 5

## 2013-10-25 MED ORDER — SODIUM CHLORIDE 0.9 % IJ SOLN
3.0000 mL | INTRAMUSCULAR | Status: DC | PRN
Start: 2013-10-25 — End: 2013-10-25

## 2013-10-25 MED ORDER — LIDOCAINE HCL (CARDIAC) 20 MG/ML IV SOLN
INTRAVENOUS | Status: AC
  Filled 2013-10-25: qty 5

## 2013-10-25 MED ORDER — ONDANSETRON HCL 4 MG/2ML IJ SOLN
4.0000 mg | Freq: Four times a day (QID) | INTRAMUSCULAR | Status: DC | PRN
Start: 2013-10-25 — End: 2013-10-25

## 2013-10-25 SURGICAL SUPPLY — 25 items
BAG URINE DRAINAGE (UROLOGICAL SUPPLIES) ×3 IMPLANT
BAG URO CATCHER STRL LF (DRAPE) ×3 IMPLANT
BLADE SURG 15 STRL LF DISP TIS (BLADE) IMPLANT
BLADE SURG 15 STRL SS (BLADE)
CATH FOLEY 2WAY SLVR 30CC 20FR (CATHETERS) ×3 IMPLANT
CATH FOLEY 3WAY 30CC 22FR (CATHETERS) IMPLANT
CATH URET 5FR 28IN OPEN ENDED (CATHETERS) IMPLANT
DRAPE CAMERA CLOSED 9X96 (DRAPES) ×3 IMPLANT
ELECT BUTTON HF 24-28F 2 30DE (ELECTRODE) ×3 IMPLANT
ELECT HF RESECT BIPO 24F 45 ND (CUTTING LOOP) ×3 IMPLANT
ELECT LOOP MED HF 24F 12D (CUTTING LOOP) IMPLANT
ELECT REM PT RETURN 9FT ADLT (ELECTROSURGICAL) ×3
ELECTRODE REM PT RTRN 9FT ADLT (ELECTROSURGICAL) ×1 IMPLANT
GLOVE SURG SS PI 8.0 STRL IVOR (GLOVE) ×3 IMPLANT
GOWN STRL REUS W/TWL XL LVL3 (GOWN DISPOSABLE) ×12 IMPLANT
HOLDER FOLEY CATH W/STRAP (MISCELLANEOUS) ×3 IMPLANT
IV NS IRRIG 3000ML ARTHROMATIC (IV SOLUTION) ×12 IMPLANT
KIT ASPIRATION TUBING (SET/KITS/TRAYS/PACK) ×3 IMPLANT
MANIFOLD NEPTUNE II (INSTRUMENTS) ×3 IMPLANT
PACK CYSTO (CUSTOM PROCEDURE TRAY) ×3 IMPLANT
SUT ETHILON 3 0 PS 1 (SUTURE) IMPLANT
SYR 30ML LL (SYRINGE) ×3 IMPLANT
SYRINGE IRR TOOMEY STRL 70CC (SYRINGE) IMPLANT
TUBING CONNECTING 10 (TUBING) ×2 IMPLANT
TUBING CONNECTING 10' (TUBING) ×1

## 2013-10-25 NOTE — Anesthesia Postprocedure Evaluation (Signed)
  Anesthesia Post-op Note  Patient: Jeremy Reeves  Procedure(s) Performed: Procedure(s) (LRB): TRANSURETHRAL RESECTION OF THE PROSTATE WITH GYRUS BUTTON (N/A)  Patient Location: PACU  Anesthesia Type: General  Level of Consciousness: awake and alert   Airway and Oxygen Therapy: Patient Spontanous Breathing  Post-op Pain: mild  Post-op Assessment: Post-op Vital signs reviewed, Patient's Cardiovascular Status Stable, Respiratory Function Stable, Patent Airway and No signs of Nausea or vomiting  Last Vitals:  Filed Vitals:   10/25/13 1359  BP: 125/63  Pulse: 65  Temp: 36.3 C  Resp: 16    Post-op Vital Signs: stable   Complications: No apparent anesthesia complications

## 2013-10-25 NOTE — Discharge Instructions (Signed)
Transurethral Resection of the Prostate Care After Refer to this sheet in the next few weeks. These instructions provide you with information on caring for yourself after your procedure. Your caregiver also may give you specific instructions. Your treatment has been planned according to current medical practices, but complications sometimes occur. Call your caregiver if you have any problems or questions after your procedure. HOME CARE INSTRUCTIONS  Recovery can take 4 6 weeks. Avoid alcohol, caffeinated drinks, and spicy foods for 2 weeks after your procedure. Drink enough fluids to keep your urine clear or pale yellow. Urinate as soon as you feel the urge to do so. Do not try to hold your urine for long periods of time. During recovery you may experience pain caused by bladder spasms, which result in a very intense urge to urinate. Take all medicines as directed by your caregiver, including medicines for pain. Try to limit the amount of pain medicines you take because it can cause constipation. If you do become constipated, do not strain to move your bowels. Straining can increase bleeding. Constipation can be minimized by increasing the amount fluids and fiber in your diet. Your caregiver also may prescribe a stool softener. Do not lift heavy objects (more than 5 lb [2.25 kg]) or perform exercises that cause you to strain for at least 1 month after your procedure. When sitting, you may want to sit in a soft chair or use a cushion. For the first 10 days after your procedure, avoid the following activities:  Running.  Strenuous work.  Long walks.  Riding in a car for extended periods.  Sex. SEEK MEDICAL CARE IF:  You have difficulty urinating.  You have blood in your urine that does not go away after you rest or increase your fluid intake.  You have swelling in your penis or scrotum. SEEK IMMEDIATE MEDICAL CARE IF:   You are suddenly unable to urinate.  You notice blood clots in your  urine.  You have chills.  You have a fever.  You have pain in your back or lower abdomen.  You have pain or swelling in your legs. MAKE SURE YOU:   Understand these instructions.  Will watch your condition.  Will get help right away if you are not doing well or get worse. Document Released: 10/04/2005 Document Revised: 06/28/2012 Document Reviewed: 11/12/2011 [facility] Patient Information 2014 [place].  You may remove the catheter in the morning by cutting of the side arm with the yellow nipple.  If you don't fell comfortable removing the catheter you can come to the office to have that done.

## 2013-10-25 NOTE — Interval H&P Note (Signed)
History and Physical Interval Note:  10/25/2013 10:46 AM  Jeremy Reeves  has presented today for surgery, with the diagnosis of BPH with Retention  The various methods of treatment have been discussed with the patient and family. After consideration of risks, benefits and other options for treatment, the patient has consented to  Procedure(s): TRANSURETHRAL RESECTION OF THE PROSTATE WITH GYRUS BUTTON (N/A) as a surgical intervention .  The patient's history has been reviewed, patient examined, no change in status, stable for surgery.  I have reviewed the patient's chart and labs.  Questions were answered to the patient's satisfaction.     Damaree Sargent,Kaylem J

## 2013-10-25 NOTE — Anesthesia Preprocedure Evaluation (Signed)
Anesthesia Evaluation  Patient identified by MRN, date of birth, ID band Patient awake    Reviewed: Allergy & Precautions, H&P , NPO status , Patient's Chart, lab work & pertinent test results  History of Anesthesia Complications (+) DIFFICULT AIRWAY and history of anesthetic complications  Airway Mallampati: II TM Distance: >3 FB Neck ROM: Full    Dental no notable dental hx. (+) Partial Upper   Pulmonary neg pulmonary ROS, former smoker,  breath sounds clear to auscultation  Pulmonary exam normal       Cardiovascular hypertension, Pt. on medications + CAD and + CABG negative cardio ROS  Rhythm:Regular Rate:Normal     Neuro/Psych negative neurological ROS  negative psych ROS   GI/Hepatic negative GI ROS, Neg liver ROS, GERD-  Medicated and Controlled,  Endo/Other  negative endocrine ROSneg diabetes  Renal/GU negative Renal ROS  negative genitourinary   Musculoskeletal negative musculoskeletal ROS (+)   Abdominal   Peds negative pediatric ROS (+)  Hematology negative hematology ROS (+) HIV,   Anesthesia Other Findings   Reproductive/Obstetrics negative OB ROS                           Anesthesia Physical Anesthesia Plan  ASA: III  Anesthesia Plan: General   Post-op Pain Management:    Induction: Intravenous  Airway Management Planned: LMA  Additional Equipment:   Intra-op Plan:   Post-operative Plan:   Informed Consent: I have reviewed the patients History and Physical, chart, labs and discussed the procedure including the risks, benefits and alternatives for the proposed anesthesia with the patient or authorized representative who has indicated his/her understanding and acceptance.   Dental advisory given  Plan Discussed with: CRNA  Anesthesia Plan Comments:         Anesthesia Quick Evaluation

## 2013-10-25 NOTE — Brief Op Note (Signed)
10/25/2013  12:08 PM  PATIENT:  Jeremy Reeves  73 y.o. male  PRE-OPERATIVE DIAGNOSIS:  BPH with Retention  POST-OPERATIVE DIAGNOSIS:  BPH with Retention  PROCEDURE:  Procedure(s): TRANSURETHRAL RESECTION OF THE PROSTATE WITH GYRUS BUTTON (N/A)  SURGEON:  Surgeon(s) and Role:    * Irine Seal, MD - Primary  PHYSICIAN ASSISTANT:   ASSISTANTS: none   ANESTHESIA:   general  EBL:  Total I/O In: -  Out: 550 [Urine:550]  BLOOD ADMINISTERED:none  DRAINS: Urinary Catheter (Foley)   LOCAL MEDICATIONS USED:  NONE  SPECIMEN:  No Specimen  DISPOSITION OF SPECIMEN:  N/A  COUNTS:  YES  TOURNIQUET:  * No tourniquets in log *  DICTATION: .Other Dictation: Dictation Number [id number]  PLAN OF CARE: Discharge to home after PACU  PATIENT DISPOSITION:  PACU - hemodynamically stable.   Delay start of Pharmacological VTE agent (>24hrs) due to surgical blood loss or risk of bleeding: not applicable

## 2013-10-25 NOTE — Transfer of Care (Signed)
Immediate Anesthesia Transfer of Care Note  Patient: Jeremy Reeves  Procedure(s) Performed: Procedure(s): TRANSURETHRAL RESECTION OF THE PROSTATE WITH GYRUS BUTTON (N/A)  Patient Location: PACU  Anesthesia Type:General  Level of Consciousness: awake, sedated and patient cooperative  Airway & Oxygen Therapy: Patient Spontanous Breathing and Patient connected to face mask oxygen  Post-op Assessment: Report given to PACU RN and Post -op Vital signs reviewed and stable  Post vital signs: Reviewed and stable  Complications: No apparent anesthesia complications

## 2013-10-26 NOTE — Op Note (Signed)
NAMEMarland Kitchen  [name], [name] NO.:  [id number]  MEDICAL RECORD NO.:  [id number]  LOCATION:  [facility]                         FACILITY:  [facility]  PHYSICIAN:  Salvator Seppala. Jeffie Pollock, M.D.    DATE OF BIRTH:  Jun 10, 1941  DATE OF PROCEDURE:  10/25/2013 DATE OF DISCHARGE:  10/25/2013                              OPERATIVE REPORT   PROCEDURE:  Transurethral electrovaporization of the prostate.  PREOPERATIVE DIAGNOSIS:  Benign prostatic hypertrophy, bladder outlet obstruction, and retention.  POSTOPERATIVE DIAGNOSIS:  Benign prostatic hypertrophy, bladder outlet obstruction, and retention.  SURGEON:  Nehemias Sauceda. Jeffie Pollock, M.D.  ANESTHESIA:  General.  SPECIMEN:  None.  DRAINS:  A 20-French Foley catheter.  COMPLICATIONS:  None.  INDICATIONS:  [name] is a 73 year old African American male with BPH and bladder outlet obstruction and acute urinary retention who is to undergo a transurethral electrovaporization of the prostate.  FINDINGS OF PROCEDURE:  He was taken to the operating room where he was given Cipro.  He was placed in lithotomy position.  His Foley was removed and PAS hose was placed.  His perineum and genitalia were prepped with Betadine solution and he was draped in usual sterile fashion.  The 20-French continuous slow resectoscope sheath was placed in the visual obturator.  The urethra was normal.  The external sphincter was intact.  The prostatic urethra was approximately 3 to 4 cm in length for bilobar hyperplasia with obstruction.  Examination of bladder revealed moderate trabeculation.  There was catheter irritation and edema on the posterior wall.  Ureteral orifices were unremarkable.  After initial inspection, the visual obturator was replaced with the [facility] handle with a 12-degree lens and button electrode and vaporization of prostate was performed.  The initial vaporization was at the bladder neck from 5-7 o'clock, and the floor of the prostate was  vaporized out to alongside the verumontanum.  The left lobe of the prostate was then vaporized from bladder neck to apex until an adequate channel was created.  This was then repeated on the right side.  Once an adequate vaporization on the right lobe was performed, some residual apical and anterior tissue was vaporized along with additional bladder neck tissue.  Once a generous channel was created, the vaporization bed was fulgurated with the button on the cautery setting and hemostasis was achieved.  Once adequate hemostasis was achieved, inspection revealed no retained tissue, clots, or active bleeding.  Ureteral orifices were intact as was the external sphincter.  The scope was then removed.  Pressure on the bladder produced an excellent stream.  A 20-French Foley catheter was inserted with the aid of a catheter guide.  The balloon was filled with 30 mL sterile fluid.  The catheter guide was removed.  The catheter was irrigated with clear return and placed to straight drainage.  The patient was taken down from lithotomy position.  His anesthetic was reversed.  He was moved to recovery room in stable condition.  There were no complications.     Marshall Cork. Jeffie Pollock, M.D.     JJW/MEDQ  D:  10/25/2013  T:  10/26/2013  Job:  [id number]

## 2013-10-29 ENCOUNTER — Encounter (HOSPITAL_COMMUNITY): Payer: Self-pay | Admitting: Urology

## 2013-11-02 DIAGNOSIS — N401 Enlarged prostate with lower urinary tract symptoms: Secondary | ICD-10-CM | POA: Diagnosis not present

## 2013-11-02 DIAGNOSIS — N139 Obstructive and reflux uropathy, unspecified: Secondary | ICD-10-CM | POA: Diagnosis not present

## 2013-11-02 DIAGNOSIS — N138 Other obstructive and reflux uropathy: Secondary | ICD-10-CM | POA: Diagnosis not present

## 2013-11-15 ENCOUNTER — Ambulatory Visit: Payer: MEDICARE | Admitting: Internal Medicine

## 2013-11-15 ENCOUNTER — Telehealth: Payer: Self-pay | Admitting: Family Medicine

## 2013-11-23 NOTE — Telephone Encounter (Signed)
lm

## 2013-11-30 ENCOUNTER — Ambulatory Visit: Payer: MEDICARE | Admitting: Internal Medicine

## 2013-12-10 ENCOUNTER — Ambulatory Visit (INDEPENDENT_AMBULATORY_CARE_PROVIDER_SITE_OTHER): Payer: MEDICARE | Admitting: Internal Medicine

## 2013-12-10 ENCOUNTER — Encounter: Payer: Self-pay | Admitting: Internal Medicine

## 2013-12-10 VITALS — BP 162/82 | HR 78 | Temp 98.7°F | Wt 189.0 lb

## 2013-12-10 DIAGNOSIS — B20 Human immunodeficiency virus [HIV] disease: Secondary | ICD-10-CM | POA: Diagnosis not present

## 2013-12-10 DIAGNOSIS — Z23 Encounter for immunization: Secondary | ICD-10-CM | POA: Diagnosis not present

## 2013-12-10 NOTE — Progress Notes (Signed)
   Subjective:    Patient ID: Jeremy Reeves, male    DOB: 03/17/1941, 73 y.o.   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 Complicated by post-herpetic neuralgia.Peripheral neuropathy now well treated with 100mg  of lyrica. He had TURP on 8th of January, now no urinary problems.   Current Outpatient Prescriptions on File Prior to Visit  Medication Sig Dispense Refill  . abacavir-lamiVUDine (EPZICOM) 600-300 MG per tablet Take 1 tablet by mouth at bedtime.      Marland Kitchen atorvastatin (LIPITOR) 80 MG tablet Take 80 mg by mouth every evening.      . carvedilol (COREG) 3.125 MG tablet Take 3.125 mg by mouth 2 (two) times daily with a meal.      . Cinnamon 500 MG capsule Take 1,000 mg by mouth daily.      . ciprofloxacin (CIPRO) 500 MG tablet Take 500 mg by mouth 2 (two) times daily.      . dolutegravir (TIVICAY) 50 MG tablet Take 50 mg by mouth at bedtime.      Marland Kitchen HYDROcodone-acetaminophen (NORCO) 5-325 MG per tablet Take 1 tablet by mouth every 6 (six) hours as needed for moderate pain.  15 tablet  0  . Multiple Vitamins-Minerals (MULTIVITAMIN WITH MINERALS) tablet Take 1 tablet by mouth daily.        . niacin (NIASPAN) 1000 MG CR tablet Take 1,000 mg by mouth at bedtime.      Marland Kitchen omeprazole (PRILOSEC) 20 MG capsule Take 20 mg by mouth daily.      . pregabalin (LYRICA) 100 MG capsule Take 100 mg by mouth 2 (two) times daily.      . vitamin E 400 UNIT capsule Take 400 Units by mouth daily.        No current facility-administered medications on file prior to visit.      Review of Systems 12 point ROS is negative    Objective:   Physical Exam BP 162/82  Pulse 78  Temp(Src) 98.7 F (37.1 C) (Oral)  Wt 189 lb (85.73 kg) Physical Exam  Constitutional:  oriented to person, place, and time. appears well-developed and well-nourished. No distress.  HENT:  Mouth/Throat: Oropharynx is clear and moist. No oropharyngeal exudate.  Cardiovascular: Normal rate,  regular rhythm and normal heart sounds. Exam reveals no gallop and no friction rub.  No murmur heard.  Pulmonary/Chest: Effort normal and breath sounds normal. No respiratory distress.  has no wheezes.  Abdominal: Soft. Bowel sounds are normal.  exhibits no distension. There is no tenderness.  Lymphadenopathy: no cervical adenopathy.  Neurological: alert and oriented to person, place, and time.  Skin: Skin is warm and dry. No rash noted. No erythema.  Psychiatric: a normal mood and affect. His behavior is normal.       Assessment & Plan:  hiv = well controlled.  He has pharmaceutical card to cover epzicom and DLG.  In august, when needs to re-appy for assistance, we will shift to triomeq. Will check cd 4 count and VL at next visit  CKD = will check bmp in 4 wks. Appears at baseline  Urinary retention = now resolved  Peripheral neuropathy = now well controlled with lyrica  Health maintenance = will start hep B vaccination series today.  Come back in 1 month for labs  rtc in  6 wk

## 2014-01-07 ENCOUNTER — Ambulatory Visit (INDEPENDENT_AMBULATORY_CARE_PROVIDER_SITE_OTHER): Payer: MEDICARE | Admitting: *Deleted

## 2014-01-07 ENCOUNTER — Other Ambulatory Visit: Payer: MEDICARE

## 2014-01-07 DIAGNOSIS — B20 Human immunodeficiency virus [HIV] disease: Secondary | ICD-10-CM

## 2014-01-07 DIAGNOSIS — Z23 Encounter for immunization: Secondary | ICD-10-CM

## 2014-01-07 LAB — CBC WITH DIFFERENTIAL/PLATELET
Basophils Absolute: 0 10*3/uL (ref 0.0–0.1)
Basophils Relative: 0 % (ref 0–1)
EOS PCT: 3 % (ref 0–5)
Eosinophils Absolute: 0.1 10*3/uL (ref 0.0–0.7)
HCT: 46.9 % (ref 39.0–52.0)
HEMOGLOBIN: 16.1 g/dL (ref 13.0–17.0)
Lymphocytes Relative: 25 % (ref 12–46)
Lymphs Abs: 1.2 10*3/uL (ref 0.7–4.0)
MCH: 26.8 pg (ref 26.0–34.0)
MCHC: 34.3 g/dL (ref 30.0–36.0)
MCV: 78 fL (ref 78.0–100.0)
MONOS PCT: 12 % (ref 3–12)
Monocytes Absolute: 0.6 10*3/uL (ref 0.1–1.0)
NEUTROS ABS: 2.9 10*3/uL (ref 1.7–7.7)
Neutrophils Relative %: 60 % (ref 43–77)
Platelets: 100 10*3/uL — ABNORMAL LOW (ref 150–400)
RBC: 6.01 MIL/uL — ABNORMAL HIGH (ref 4.22–5.81)
RDW: 16 % — ABNORMAL HIGH (ref 11.5–15.5)
WBC: 4.8 10*3/uL (ref 4.0–10.5)

## 2014-01-07 LAB — BASIC METABOLIC PANEL
BUN: 12 mg/dL (ref 6–23)
CALCIUM: 9.5 mg/dL (ref 8.4–10.5)
CHLORIDE: 103 meq/L (ref 96–112)
CO2: 28 mEq/L (ref 19–32)
Creat: 1.35 mg/dL (ref 0.50–1.35)
Glucose, Bld: 111 mg/dL — ABNORMAL HIGH (ref 70–99)
Potassium: 4 mEq/L (ref 3.5–5.3)
SODIUM: 141 meq/L (ref 135–145)

## 2014-01-08 LAB — HIV-1 RNA QUANT-NO REFLEX-BLD: HIV 1 RNA Quant: <20 copies/mL (ref <20)

## 2014-01-08 LAB — T-HELPER CELL (CD4) - (RCID CLINIC ONLY)
CD4 % Helper T Cell: 38 % (ref 33–55)
CD4 T CELL ABS: 480 /uL (ref 400–2700)

## 2014-01-22 ENCOUNTER — Ambulatory Visit (INDEPENDENT_AMBULATORY_CARE_PROVIDER_SITE_OTHER): Payer: MEDICARE | Admitting: Internal Medicine

## 2014-01-22 ENCOUNTER — Encounter: Payer: Self-pay | Admitting: Internal Medicine

## 2014-01-22 VITALS — BP 130/70 | HR 68 | Temp 97.2°F | Wt 195.0 lb

## 2014-01-22 DIAGNOSIS — G609 Hereditary and idiopathic neuropathy, unspecified: Secondary | ICD-10-CM | POA: Diagnosis not present

## 2014-01-22 DIAGNOSIS — B0229 Other postherpetic nervous system involvement: Secondary | ICD-10-CM | POA: Diagnosis not present

## 2014-01-22 DIAGNOSIS — B20 Human immunodeficiency virus [HIV] disease: Secondary | ICD-10-CM

## 2014-01-22 DIAGNOSIS — N182 Chronic kidney disease, stage 2 (mild): Secondary | ICD-10-CM | POA: Diagnosis not present

## 2014-01-22 MED ORDER — PREGABALIN 50 MG PO CAPS: 50.0000 mg | ORAL_CAPSULE | Freq: Two times a day (BID) | ORAL | Status: DC

## 2014-01-22 MED ORDER — ABACAVIR-DOLUTEGRAVIR-LAMIVUD 600-50-300 MG PO TABS: 1.0000 | ORAL_TABLET | Freq: Every day | ORAL | Status: DC

## 2014-01-22 NOTE — Progress Notes (Signed)
Subjective:    Patient ID: Jeremy Reeves, male    DOB: 02/18/1941, 73 y.o.   MRN: [id number]  HPI 73yo M with HIV, CD 4 count 480/VL<20, on epzicom/tivicay, doing well. Peripheral neuropathy improved but still as occ discomfort from lowe extremity swelling.   Current Outpatient Prescriptions on File Prior to Visit  Medication Sig Dispense Refill  . abacavir-lamiVUDine (EPZICOM) 600-300 MG per tablet Take 1 tablet by mouth at bedtime.      Marland Kitchen atorvastatin (LIPITOR) 80 MG tablet Take 80 mg by mouth every evening.      . carvedilol (COREG) 3.125 MG tablet Take 3.125 mg by mouth 2 (two) times daily with a meal.      . Cinnamon 500 MG capsule Take 1,000 mg by mouth daily.      . dolutegravir (TIVICAY) 50 MG tablet Take 50 mg by mouth at bedtime.      Marland Kitchen HYDROcodone-acetaminophen (NORCO) 5-325 MG per tablet Take 1 tablet by mouth every 6 (six) hours as needed for moderate pain.  15 tablet  0  . Multiple Vitamins-Minerals (MULTIVITAMIN WITH MINERALS) tablet Take 1 tablet by mouth daily.        . niacin (NIASPAN) 1000 MG CR tablet Take 1,000 mg by mouth at bedtime.      Marland Kitchen omeprazole (PRILOSEC) 20 MG capsule Take 20 mg by mouth daily.      . pregabalin (LYRICA) 100 MG capsule Take 100 mg by mouth 2 (two) times daily.      . vitamin E 400 UNIT capsule Take 400 Units by mouth daily.        No current facility-administered medications on file prior to visit.   Active Ambulatory Problems    Diagnosis Date Noted  . ASHD (arteriosclerotic heart disease) 03/18/2011  . BPH (benign prostatic hyperplasia) 03/18/2011  . Hyperlipidemia LDL goal <70 03/18/2011  . Diabetes mellitus 03/18/2011  . Hypertension associated with diabetes 01/25/2013  . HIV disease 05/24/2013  . Nausea with vomiting 07/04/2013  . Hypotension, unspecified 07/04/2013  . Hyponatremia 07/04/2013  . Acute renal failure 07/04/2013  . Obstructive uropathy 07/05/2013  . GERD (gastroesophageal reflux disease) 07/05/2013  . Herpes  zoster 07/05/2013  . Diabetic neuropathy 08/09/2013   Resolved Ambulatory Problems    Diagnosis Date Noted  . No Resolved Ambulatory Problems   Past Medical History  Diagnosis Date  . Hyperlipidemia   . Hiatal hernia   . HH (hiatus hernia)   . HIV (human immunodeficiency virus infection) dx'd 2014  . Hypertension   . Type II diabetes mellitus   . Shingles 8/14  . Foley catheter in place 10/23/12  . Difficult intubation       Review of Systems  Constitutional: Negative for fever, chills, diaphoresis, activity change, appetite change, fatigue and unexpected weight change.  HENT: Negative for congestion, sore throat, rhinorrhea, sneezing, trouble swallowing and sinus pressure.  Eyes: Negative for photophobia and visual disturbance.  Respiratory: Negative for cough, chest tightness, shortness of breath, wheezing and stridor.  Cardiovascular: Negative for chest pain, palpitations and leg swelling.  Gastrointestinal: Negative for nausea, vomiting, abdominal pain, diarrhea, constipation, blood in stool, abdominal distention and anal bleeding.  Genitourinary: Negative for dysuria, hematuria, flank pain and difficulty urinating.  Musculoskeletal: Negative for myalgias, back pain, joint swelling, arthralgias and gait problem.  Skin: Negative for color change, pallor, rash and wound.  Neurological: Negative for dizziness, tremors, weakness and light-headedness.  Hematological: Negative for adenopathy. Does not bruise/bleed easily.  Psychiatric/Behavioral: Negative  for behavioral problems, confusion, sleep disturbance, dysphoric mood, decreased concentration and agitation.       Objective:   Physical Exam BP 130/70  Pulse 68  Temp(Src) 97.2 F (36.2 C) (Oral)  Wt 195 lb (88.451 kg)  Constitutional: He is oriented to person, place, and time. He appears well-developed and well-nourished. No distress.  HENT:  Mouth/Throat: Oropharynx is clear and moist. No oropharyngeal exudate.    Cardiovascular: Normal rate, regular rhythm and normal heart sounds. Exam reveals no gallop and no friction rub.  No murmur heard.  Pulmonary/Chest: Effort normal and breath sounds normal. No respiratory distress. He has no wheezes.  Abdominal: Soft. Bowel sounds are normal. He exhibits no distension. There is no tenderness.  Lymphadenopathy:  He has no cervical adenopathy.  Neurological: He is alert and oriented to person, place, and time.  Skin: Skin is warm and dry. No rash noted. No erythema.  Psychiatric: He has a normal mood and affect. His behavior is normal.  Ext =trace edema       Assessment & Plan:  hiv = well controlled, will switch to triomeq since covered by insurance  Peripheral neuropathy = will decrease lyrica to 50mg  bid  Health maintenance = hep b #3 in aug 2015, csy done in 2012 which was normal

## 2014-02-01 ENCOUNTER — Encounter: Payer: Self-pay | Admitting: Cardiovascular Disease

## 2014-02-04 ENCOUNTER — Ambulatory Visit (INDEPENDENT_AMBULATORY_CARE_PROVIDER_SITE_OTHER): Payer: MEDICARE | Admitting: Cardiovascular Disease

## 2014-02-04 ENCOUNTER — Encounter: Payer: Self-pay | Admitting: *Deleted

## 2014-02-04 ENCOUNTER — Encounter: Payer: Self-pay | Admitting: Cardiovascular Disease

## 2014-02-04 VITALS — BP 138/82 | Ht 67.0 in | Wt 192.2 lb

## 2014-02-04 DIAGNOSIS — I10 Essential (primary) hypertension: Secondary | ICD-10-CM | POA: Diagnosis not present

## 2014-02-04 DIAGNOSIS — I2510 Atherosclerotic heart disease of native coronary artery without angina pectoris: Secondary | ICD-10-CM | POA: Diagnosis not present

## 2014-02-04 DIAGNOSIS — E1169 Type 2 diabetes mellitus with other specified complication: Secondary | ICD-10-CM | POA: Diagnosis not present

## 2014-02-04 DIAGNOSIS — E1159 Type 2 diabetes mellitus with other circulatory complications: Secondary | ICD-10-CM

## 2014-02-04 DIAGNOSIS — E785 Hyperlipidemia, unspecified: Secondary | ICD-10-CM | POA: Diagnosis not present

## 2014-02-04 DIAGNOSIS — I152 Hypertension secondary to endocrine disorders: Secondary | ICD-10-CM

## 2014-02-04 MED ORDER — CARVEDILOL 3.125 MG PO TABS: 3.1250 mg | ORAL_TABLET | Freq: Two times a day (BID) | ORAL | Status: DC

## 2014-02-04 MED ORDER — OMEPRAZOLE 20 MG PO CPDR: 20.0000 mg | DELAYED_RELEASE_CAPSULE | Freq: Every day | ORAL | Status: DC

## 2014-02-04 MED ORDER — ATORVASTATIN CALCIUM 80 MG PO TABS: 80.0000 mg | ORAL_TABLET | Freq: Every evening | ORAL | Status: DC

## 2014-02-04 MED ORDER — NIACIN ER (ANTIHYPERLIPIDEMIC) 1000 MG PO TBCR: 2000.0000 mg | EXTENDED_RELEASE_TABLET | Freq: Every day | ORAL | Status: DC

## 2014-02-04 NOTE — Assessment & Plan Note (Signed)
Status post coronary artery bypass grafting in 1999 with a LIMA to his LAD, vein to obtuse marginal branch and RCA. His last Myoview stress test performed February 2013 was nonischemic. He denies chest pain or shortness of breath.

## 2014-02-04 NOTE — Assessment & Plan Note (Signed)
Controlled on current medications 

## 2014-02-04 NOTE — Assessment & Plan Note (Signed)
On statin therapy with his most recent lipid profile performed 05/24/13 revealing a total cholesterol of 85, LDL of 42 and HDL of 31

## 2014-02-04 NOTE — Patient Instructions (Signed)
Your physician recommends that you schedule a follow-up appointment in: ONE YEAR with DR.BERY

## 2014-02-04 NOTE — Progress Notes (Signed)
02/04/2014 Jeremy Reeves   [birth year]  [id number]  Primary Physician Wyatt Haste, MD Primary Cardiologist: Lorretta Harp MD Renae Gloss   HPI:  The patient is a 73 year old, moderately overweight, married [place] American male, father of 2, grandfather to 3 grandchildren who I last saw in the office 6 months ago. He has a history of ischemic heart disease status post coronary artery bypass grafting in 1999 with a LIMA to his LAD, a vein to an obtuse marginal branch and to the RCA. Myoview performed February 2013 was nonischemic. He denies chest pain or shortness of breath. His most recent lipid profile performed 05/24/13 revealed a total cholesterol of 85, LDL of 42 and HDL of 31.    Current Outpatient Prescriptions  Medication Sig Dispense Refill  . Abacavir-Dolutegravir-Lamivud (TRIUMEQ) 600-50-300 MG TABS Take 1 tablet by mouth daily.  30 tablet  11  . atorvastatin (LIPITOR) 80 MG tablet Take 80 mg by mouth every evening.      . carvedilol (COREG) 3.125 MG tablet Take 3.125 mg by mouth 2 (two) times daily with a meal.      . Cinnamon 500 MG capsule Take 1,000 mg by mouth daily.      . Multiple Vitamins-Minerals (MULTIVITAMIN WITH MINERALS) tablet Take 1 tablet by mouth daily.        . niacin (NIASPAN) 1000 MG CR tablet Take 1,000 mg by mouth at bedtime.      Marland Kitchen omeprazole (PRILOSEC) 20 MG capsule Take 20 mg by mouth daily.      . pregabalin (LYRICA) 50 MG capsule Take 1 capsule (50 mg total) by mouth 2 (two) times daily.  60 capsule  3  . vitamin E 400 UNIT capsule Take 400 Units by mouth daily.        No current facility-administered medications for this visit.    Allergies  Allergen Reactions  . Peanut-Containing Drug Products Swelling    History   Social History  . Marital Status: Married    Spouse Name: N/A    Number of Children: N/A  . Years of Education: N/A   Occupational History  . Not on file.   Social History Main Topics  .  Smoking status: Former Smoker -- 0.12 packs/day for 40 years    Types: Cigarettes  . Smokeless tobacco: Never Used     Comment: 07/04/2013 "I quit smoking before 2000"  . Alcohol Use: No  . Drug Use: No  . Sexual Activity: Not Currently    Partners: Female     Comment: declined condoms   Other Topics Concern  . Not on file   Social History Narrative  . No narrative on file     Review of Systems: General: negative for chills, fever, night sweats or weight changes.  Cardiovascular: negative for chest pain, dyspnea on exertion, edema, orthopnea, palpitations, paroxysmal nocturnal dyspnea or shortness of breath Dermatological: negative for rash Respiratory: negative for cough or wheezing Urologic: negative for hematuria Abdominal: negative for nausea, vomiting, diarrhea, bright red blood per rectum, melena, or hematemesis Neurologic: negative for visual changes, syncope, or dizziness All other systems reviewed and are otherwise negative except as noted above.    Blood pressure 138/82, height 5\' 7"  (1.702 m), weight 192 lb 3.2 oz (87.181 kg).  General appearance: alert and no distress Neck: no adenopathy, no carotid bruit, no JVD, supple, symmetrical, trachea midline and thyroid not enlarged, symmetric, no tenderness/mass/nodules Lungs: clear to auscultation bilaterally Heart: regular rate and rhythm,  S1, S2 normal, no murmur, click, rub or gallop Extremities: extremities normal, atraumatic, no cyanosis or edema  EKG normal sinus rhythm at 78 with bifascicular block (right bundle branch block, left anterior fascicular block).  ASSESSMENT AND PLAN:   ASHD (arteriosclerotic heart disease) Status post coronary artery bypass grafting in 1999 with a LIMA to his LAD, vein to obtuse marginal branch and RCA. His last Myoview stress test performed February 2013 was nonischemic. He denies chest pain or shortness of breath.  Hyperlipidemia LDL goal <70 On statin therapy with his most  recent lipid profile performed 05/24/13 revealing a total cholesterol of 85, LDL of 42 and HDL of 31  Hypertension associated with diabetes Controlled on current medications      Lorretta Harp MD [facility], [facility] 02/04/2014 10:27 AM

## 2014-02-26 ENCOUNTER — Other Ambulatory Visit: Payer: Self-pay | Admitting: *Deleted

## 2014-02-26 NOTE — Telephone Encounter (Signed)
Rx refill was denied, Dr Gwenlyn Found sent in Rx refill on 4/20 with 3 additional refills.

## 2014-04-03 DIAGNOSIS — N390 Urinary tract infection, site not specified: Secondary | ICD-10-CM | POA: Diagnosis not present

## 2014-04-03 DIAGNOSIS — R339 Retention of urine, unspecified: Secondary | ICD-10-CM | POA: Diagnosis not present

## 2014-04-03 DIAGNOSIS — N401 Enlarged prostate with lower urinary tract symptoms: Secondary | ICD-10-CM | POA: Diagnosis not present

## 2014-04-09 ENCOUNTER — Other Ambulatory Visit (INDEPENDENT_AMBULATORY_CARE_PROVIDER_SITE_OTHER): Payer: MEDICARE

## 2014-04-09 DIAGNOSIS — B20 Human immunodeficiency virus [HIV] disease: Secondary | ICD-10-CM | POA: Diagnosis not present

## 2014-04-09 LAB — COMPLETE METABOLIC PANEL WITH GFR
ALT: 44 U/L (ref 0–53)
AST: 33 U/L (ref 0–37)
Albumin: 4.3 g/dL (ref 3.5–5.2)
Alkaline Phosphatase: 55 U/L (ref 39–117)
BILIRUBIN TOTAL: 0.6 mg/dL (ref 0.2–1.2)
BUN: 10 mg/dL (ref 6–23)
CALCIUM: 9.1 mg/dL (ref 8.4–10.5)
CHLORIDE: 102 meq/L (ref 96–112)
CO2: 27 mEq/L (ref 19–32)
CREATININE: 1.54 mg/dL — AB (ref 0.50–1.35)
GFR, Est African American: 52 mL/min — ABNORMAL LOW
GFR, Est Non African American: 45 mL/min — ABNORMAL LOW
Glucose, Bld: 106 mg/dL — ABNORMAL HIGH (ref 70–99)
Potassium: 4 mEq/L (ref 3.5–5.3)
SODIUM: 136 meq/L (ref 135–145)
Total Protein: 7.1 g/dL (ref 6.0–8.3)

## 2014-04-09 LAB — CBC WITH DIFFERENTIAL/PLATELET
Basophils Absolute: 0 10*3/uL (ref 0.0–0.1)
Basophils Relative: 0 % (ref 0–1)
Eosinophils Absolute: 0.2 10*3/uL (ref 0.0–0.7)
Eosinophils Relative: 3 % (ref 0–5)
HCT: 49.1 % (ref 39.0–52.0)
Hemoglobin: 16.9 g/dL (ref 13.0–17.0)
LYMPHS ABS: 1.4 10*3/uL (ref 0.7–4.0)
LYMPHS PCT: 27 % (ref 12–46)
MCH: 26.9 pg (ref 26.0–34.0)
MCHC: 34.4 g/dL (ref 30.0–36.0)
MCV: 78.1 fL (ref 78.0–100.0)
Monocytes Absolute: 0.6 10*3/uL (ref 0.1–1.0)
Monocytes Relative: 11 % (ref 3–12)
NEUTROS PCT: 59 % (ref 43–77)
Neutro Abs: 3.1 10*3/uL (ref 1.7–7.7)
PLATELETS: 106 10*3/uL — AB (ref 150–400)
RBC: 6.29 MIL/uL — AB (ref 4.22–5.81)
RDW: 18.2 % — ABNORMAL HIGH (ref 11.5–15.5)
WBC: 5.2 10*3/uL (ref 4.0–10.5)

## 2014-04-10 LAB — HIV-1 RNA QUANT-NO REFLEX-BLD: HIV-1 RNA Quant, Log: <1.3 {Log} (ref <1.30)

## 2014-04-10 LAB — T-HELPER CELL (CD4) - (RCID CLINIC ONLY)
CD4 % Helper T Cell: 35 % (ref 33–55)
CD4 T CELL ABS: 470 /uL (ref 400–2700)

## 2014-04-23 ENCOUNTER — Ambulatory Visit: Payer: MEDICARE | Admitting: Internal Medicine

## 2014-05-06 DIAGNOSIS — N390 Urinary tract infection, site not specified: Secondary | ICD-10-CM | POA: Diagnosis not present

## 2014-05-07 ENCOUNTER — Encounter: Payer: Self-pay | Admitting: Internal Medicine

## 2014-05-07 ENCOUNTER — Ambulatory Visit (INDEPENDENT_AMBULATORY_CARE_PROVIDER_SITE_OTHER): Payer: MEDICARE | Admitting: Internal Medicine

## 2014-05-07 VITALS — BP 142/81 | HR 64 | Temp 98.2°F | Wt 193.0 lb

## 2014-05-07 DIAGNOSIS — B0229 Other postherpetic nervous system involvement: Secondary | ICD-10-CM | POA: Diagnosis not present

## 2014-05-07 DIAGNOSIS — I2510 Atherosclerotic heart disease of native coronary artery without angina pectoris: Secondary | ICD-10-CM | POA: Diagnosis not present

## 2014-05-07 DIAGNOSIS — B20 Human immunodeficiency virus [HIV] disease: Secondary | ICD-10-CM | POA: Diagnosis not present

## 2014-05-07 DIAGNOSIS — E7800 Pure hypercholesterolemia, unspecified: Secondary | ICD-10-CM

## 2014-05-07 MED ORDER — PRAVASTATIN SODIUM 40 MG PO TABS: 40.0000 mg | ORAL_TABLET | Freq: Every day | ORAL | Status: DC

## 2014-05-07 NOTE — Progress Notes (Signed)
Subjective:    Patient ID: Jeremy Reeves, male    DOB: 20-Jul-1941, 73 y.o.   MRN: [id number]  HPI 73yo M with HIV, Cd 4 count of 470/VL<20, doing well with triomeq. Doesn't feel like he has any improvement with lyrica for zoster pain. He does well with his medicaitons. Denies any new illness still has occasional neuropathy that bothers his feet  Current Outpatient Prescriptions on File Prior to Visit  Medication Sig Dispense Refill  . Abacavir-Dolutegravir-Lamivud (TRIUMEQ) 600-50-300 MG TABS Take 1 tablet by mouth daily.  30 tablet  11  . carvedilol (COREG) 3.125 MG tablet Take 1 tablet (3.125 mg total) by mouth 2 (two) times daily with a meal.  180 tablet  3  . Cinnamon 500 MG capsule Take 1,000 mg by mouth daily.      . Multiple Vitamins-Minerals (MULTIVITAMIN WITH MINERALS) tablet Take 1 tablet by mouth daily.        . niacin (NIASPAN) 1000 MG CR tablet Take 2 tablets (2,000 mg total) by mouth at bedtime.  180 tablet  3  . omeprazole (PRILOSEC) 20 MG capsule Take 1 capsule (20 mg total) by mouth daily.  90 capsule  3  . vitamin E 400 UNIT capsule Take 400 Units by mouth daily.        No current facility-administered medications on file prior to visit.   Active Ambulatory Problems    Diagnosis Date Noted  . ASHD (arteriosclerotic heart disease) 03/18/2011  . BPH (benign prostatic hyperplasia) 03/18/2011  . Hyperlipidemia LDL goal <70 03/18/2011  . Diabetes mellitus 03/18/2011  . Hypertension associated with diabetes 01/25/2013  . HIV disease 05/24/2013  . Nausea with vomiting 07/04/2013  . Hypotension, unspecified 07/04/2013  . Hyponatremia 07/04/2013  . Acute renal failure 07/04/2013  . Obstructive uropathy 07/05/2013  . GERD (gastroesophageal reflux disease) 07/05/2013  . Herpes zoster 07/05/2013  . Diabetic neuropathy 08/09/2013   Resolved Ambulatory Problems    Diagnosis Date Noted  . No Resolved Ambulatory Problems   Past Medical History  Diagnosis Date  .  Hyperlipidemia   . Hiatal hernia   . HH (hiatus hernia)   . HIV (human immunodeficiency virus infection) dx'd 2014  . Hypertension   . Type II diabetes mellitus   . Shingles 8/14  . Foley catheter in place 10/23/12  . Difficult intubation         Review of Systems + decreased vision, requiring stronger reading glasses    Objective:   Physical Exam BP 142/81  Pulse 64  Temp(Src) 98.2 F (36.8 C) (Oral)  Wt 193 lb (87.544 kg) Physical Exam  Constitutional:  oriented to person, place, and time. appears well-developed and well-nourished. No distress.  HENT:  Mouth/Throat: Oropharynx is clear and moist. No oropharyngeal exudate.  Cardiovascular: Normal rate, regular rhythm and normal heart sounds. Exam reveals no gallop and no friction rub.  No murmur heard.  Pulmonary/Chest: Effort normal and breath sounds normal. No respiratory distress.  has no wheezes.  Abdominal: Soft. Bowel sounds are normal.  exhibits no distension. There is no tenderness.  Lymphadenopathy: no cervical adenopathy.  Neurological: alert and oriented to person, place, and time.  Skin: Skin is warm and dry. No rash noted. No erythema.  Psychiatric: a normal mood and affect. His behavior is normal.         Assessment & Plan:   hiv = well controlled, will continue triomeq Decreased vision = recommend to get visual exam hyperchol = change to pravachol 40mg   daily. D/c lipitor Post zoster pain = will d/c lyrica

## 2014-05-28 ENCOUNTER — Telehealth: Payer: Self-pay | Admitting: *Deleted

## 2014-05-28 NOTE — Telephone Encounter (Signed)
Called the [employer].  Mr. Quincy has been approved for another year.

## 2014-06-19 ENCOUNTER — Other Ambulatory Visit: Payer: Self-pay | Admitting: Cardiovascular Disease

## 2014-06-19 NOTE — Telephone Encounter (Signed)
Refilled # 180 tablet with 3 refills on 02/04/2014

## 2014-07-16 ENCOUNTER — Other Ambulatory Visit: Payer: Self-pay | Admitting: Cardiovascular Disease

## 2014-07-17 ENCOUNTER — Telehealth: Payer: Self-pay | Admitting: Cardiovascular Disease

## 2014-07-17 NOTE — Telephone Encounter (Signed)
Left VM for Jeremy Reeves that medication was refilled on 02/04/14 for 180 tablets with 3 refills (should be enough to last from April 2015 to April 2016).

## 2014-07-17 NOTE — Telephone Encounter (Signed)
Pt's wife called in stating that he is out of his Niacin and would like it refilled and called in to the [facility] mail order pharmacy for a 90 day supply.

## 2014-07-17 NOTE — Telephone Encounter (Signed)
Refilled #180 tablets with 3 refills on 02/04/2014

## 2014-07-19 ENCOUNTER — Telehealth: Payer: Self-pay | Admitting: Cardiovascular Disease

## 2014-07-19 NOTE — Telephone Encounter (Signed)
Noted. See telephone encounter from 9/30

## 2014-07-19 NOTE — Telephone Encounter (Signed)
His prescription for Niaciin is okay,it was a different RX number.

## 2014-07-31 ENCOUNTER — Other Ambulatory Visit (HOSPITAL_COMMUNITY)
Admission: RE | Admit: 2014-07-31 | Discharge: 2014-07-31 | Disposition: A | Discharge status: Home / Self Care | Payer: MEDICARE | Source: Ambulatory Visit | Attending: Internal Medicine | Admitting: Internal Medicine

## 2014-07-31 ENCOUNTER — Encounter: Payer: Self-pay | Admitting: Internal Medicine

## 2014-07-31 ENCOUNTER — Ambulatory Visit (INDEPENDENT_AMBULATORY_CARE_PROVIDER_SITE_OTHER): Payer: MEDICARE | Admitting: Internal Medicine

## 2014-07-31 VITALS — BP 148/72 | HR 62 | Temp 97.2°F | Wt 190.0 lb

## 2014-07-31 DIAGNOSIS — Z113 Encounter for screening for infections with a predominantly sexual mode of transmission: Secondary | ICD-10-CM | POA: Insufficient documentation

## 2014-07-31 DIAGNOSIS — Z21 Asymptomatic human immunodeficiency virus [HIV] infection status: Secondary | ICD-10-CM | POA: Diagnosis not present

## 2014-07-31 DIAGNOSIS — I2510 Atherosclerotic heart disease of native coronary artery without angina pectoris: Secondary | ICD-10-CM | POA: Diagnosis not present

## 2014-07-31 DIAGNOSIS — Z23 Encounter for immunization: Secondary | ICD-10-CM

## 2014-07-31 DIAGNOSIS — Z79899 Other long term (current) drug therapy: Secondary | ICD-10-CM

## 2014-07-31 LAB — CBC WITH DIFFERENTIAL/PLATELET
BASOS PCT: 0 % (ref 0–1)
Basophils Absolute: 0 10*3/uL (ref 0.0–0.1)
Eosinophils Absolute: 0.1 10*3/uL (ref 0.0–0.7)
Eosinophils Relative: 2 % (ref 0–5)
HCT: 49.2 % (ref 39.0–52.0)
HEMOGLOBIN: 16.9 g/dL (ref 13.0–17.0)
LYMPHS PCT: 24 % (ref 12–46)
Lymphs Abs: 1.2 10*3/uL (ref 0.7–4.0)
MCH: 28.3 pg (ref 26.0–34.0)
MCHC: 34.3 g/dL (ref 30.0–36.0)
MCV: 82.4 fL (ref 78.0–100.0)
Monocytes Absolute: 0.6 10*3/uL (ref 0.1–1.0)
Monocytes Relative: 12 % (ref 3–12)
NEUTROS PCT: 62 % (ref 43–77)
Neutro Abs: 3.2 10*3/uL (ref 1.7–7.7)
Platelets: 113 10*3/uL — ABNORMAL LOW (ref 150–400)
RBC: 5.97 MIL/uL — ABNORMAL HIGH (ref 4.22–5.81)
RDW: 16.4 % — AB (ref 11.5–15.5)
WBC: 5.1 10*3/uL (ref 4.0–10.5)

## 2014-07-31 LAB — COMPLETE METABOLIC PANEL WITH GFR
ALK PHOS: 48 U/L (ref 39–117)
ALT: 35 U/L (ref 0–53)
AST: 35 U/L (ref 0–37)
Albumin: 3.9 g/dL (ref 3.5–5.2)
BUN: 13 mg/dL (ref 6–23)
CO2: 26 mEq/L (ref 19–32)
Calcium: 9 mg/dL (ref 8.4–10.5)
Chloride: 104 mEq/L (ref 96–112)
Creat: 1.39 mg/dL — ABNORMAL HIGH (ref 0.50–1.35)
GFR, Est African American: 59 mL/min — ABNORMAL LOW
GFR, Est Non African American: 51 mL/min — ABNORMAL LOW
Glucose, Bld: 101 mg/dL — ABNORMAL HIGH (ref 70–99)
Potassium: 3.8 mEq/L (ref 3.5–5.3)
SODIUM: 142 meq/L (ref 135–145)
Total Bilirubin: 0.8 mg/dL (ref 0.2–1.2)
Total Protein: 6.8 g/dL (ref 6.0–8.3)

## 2014-07-31 LAB — LIPID PANEL
CHOLESTEROL: 114 mg/dL (ref 0–200)
HDL: 39 mg/dL — AB (ref >39)
LDL Cholesterol: 60 mg/dL (ref 0–99)
TRIGLYCERIDES: 75 mg/dL (ref <150)
Total CHOL/HDL Ratio: 2.9 Ratio
VLDL: 15 mg/dL (ref 0–40)

## 2014-07-31 NOTE — Progress Notes (Signed)
Patient ID: Jeremy Reeves, male   DOB: 1941-07-24, 73 y.o.   MRN: [id number]       Patient ID: Jeremy Reeves, male   DOB: 08-Apr-1941, 73 y.o.   MRN: [id number]  HPI 73yo M with HIV disease, CD 4 count of 470/VL<20 on triumeq has good adherence. He denies any difficulties with his health since we last saw him. No urinary symptoms, fever, chills, nightsweats. Still has occ pain/numbness to left leg where previous zoster rash had occurred.  Outpatient Encounter Prescriptions as of 07/31/2014  Medication Sig  . Abacavir-Dolutegravir-Lamivud (TRIUMEQ) 600-50-300 MG TABS Take 1 tablet by mouth daily.  . carvedilol (COREG) 3.125 MG tablet Take 1 tablet (3.125 mg total) by mouth 2 (two) times daily with a meal.  . Cinnamon 500 MG capsule Take 1,000 mg by mouth daily.  . Multiple Vitamins-Minerals (MULTIVITAMIN WITH MINERALS) tablet Take 1 tablet by mouth daily.    . niacin (NIASPAN) 1000 MG CR tablet Take 2 tablets (2,000 mg total) by mouth at bedtime.  Marland Kitchen omeprazole (PRILOSEC) 20 MG capsule Take 1 capsule (20 mg total) by mouth daily.  . pravastatin (PRAVACHOL) 40 MG tablet Take 1 tablet (40 mg total) by mouth daily.  . vitamin E 400 UNIT capsule Take 400 Units by mouth daily.      Patient Active Problem List   Diagnosis Date Noted  . Diabetic neuropathy 08/09/2013  . Obstructive uropathy 07/05/2013  . GERD (gastroesophageal reflux disease) 07/05/2013  . Herpes zoster 07/05/2013  . Nausea with vomiting 07/04/2013  . Hypotension, unspecified 07/04/2013  . Hyponatremia 07/04/2013  . Acute renal failure 07/04/2013  . HIV disease 05/24/2013  . Hypertension associated with diabetes 01/25/2013  . ASHD (arteriosclerotic heart disease) 03/18/2011  . BPH (benign prostatic hyperplasia) 03/18/2011  . Hyperlipidemia LDL goal <70 03/18/2011  . Diabetes mellitus 03/18/2011     Health Maintenance Due  Topic Date Due  . Foot Exam  08/17/1952  . Ophthalmology Exam  08/17/1952  . Urine  Microalbumin  08/17/1952  . Hemoglobin A1c  01/02/2014  . Influenza Vaccine  05/18/2014     Review of Systems occ left foot pain/numbness, otherwise 10 point ros is negative  Physical Exam  BP 148/72  Pulse 62  Temp(Src) 97.2 F (36.2 C) (Oral)  Wt 190 lb (86.183 kg) Physical Exam  Constitutional: He is oriented to person, place, and time. He appears well-developed and well-nourished. No distress.  HENT:  Mouth/Throat: Oropharynx is clear and moist. No oropharyngeal exudate.  Cardiovascular: Normal rate, regular rhythm and normal heart sounds. Exam reveals no gallop and no friction rub.  No murmur heard.  Pulmonary/Chest: Effort normal and breath sounds normal. No respiratory distress. He has no wheezes.  Abdominal: Soft. Bowel sounds are normal. He exhibits no distension. There is no tenderness.  Lymphadenopathy:  He has no cervical adenopathy.  Neurological: He is alert and oriented to person, place, and time.  Skin: Skin is warm and dry. No rash noted. No erythema.  Psychiatric: He has a normal mood and affect. His behavior is normal.     Lab Results  Component Value Date   CD4TCELL 35 04/09/2014   Lab Results  Component Value Date   CD4TABS 470 04/09/2014   CD4TABS 480 01/07/2014   CD4TABS 350* 09/20/2013   Lab Results  Component Value Date   HIV1RNAQUANT <20 04/09/2014   Lab Results  Component Value Date   HEPBSAB NEG 05/24/2013   No results found for this basename: RPR  CBC Lab Results  Component Value Date   WBC 5.2 04/09/2014   RBC 6.29* 04/09/2014   HGB 16.9 04/09/2014   HCT 49.1 04/09/2014   PLT 106* 04/09/2014   MCV 78.1 04/09/2014   MCH 26.9 04/09/2014   MCHC 34.4 04/09/2014   RDW 18.2* 04/09/2014   LYMPHSABS 1.4 04/09/2014   MONOABS 0.6 04/09/2014   EOSABS 0.2 04/09/2014   BASOSABS 0.0 04/09/2014   BMET Lab Results  Component Value Date   NA 136 04/09/2014   K 4.0 04/09/2014   CL 102 04/09/2014   CO2 27 04/09/2014   GLUCOSE 106* 04/09/2014   BUN 10  04/09/2014   CREATININE 1.54* 04/09/2014   CALCIUM 9.1 04/09/2014   GFRNONAA 45* 04/09/2014   GFRAA 52* 04/09/2014     Assessment and Plan  hiv = will check labs  Health maintenance = will get flu shot. Will get hep B #3 today as well. Will check rpr, lipids today, ua and urine gc/chlam  Hyperlipidemia = continue with pravachol. 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 No change in management

## 2014-08-01 LAB — HIV-1 RNA QUANT-NO REFLEX-BLD: HIV-1 RNA Quant, Log: <1.3 {Log} (ref <1.30)

## 2014-08-01 LAB — RPR

## 2014-08-01 LAB — URINALYSIS
Bilirubin Urine: NEGATIVE
Glucose, UA: NEGATIVE mg/dL
Hgb urine dipstick: NEGATIVE
Leukocytes, UA: NEGATIVE
NITRITE: NEGATIVE
PROTEIN: NEGATIVE mg/dL
SPECIFIC GRAVITY, URINE: 1.025 (ref 1.005–1.030)
UROBILINOGEN UA: 0.2 mg/dL (ref 0.0–1.0)
pH: 6 (ref 5.0–8.0)

## 2014-08-01 LAB — URINE CYTOLOGY ANCILLARY ONLY
Chlamydia: NEGATIVE
NEISSERIA GONORRHEA: NEGATIVE

## 2014-08-01 LAB — T-HELPER CELL (CD4) - (RCID CLINIC ONLY)
CD4 % Helper T Cell: 37 % (ref 33–55)
CD4 T Cell Abs: 480 /uL (ref 400–2700)

## 2014-08-07 ENCOUNTER — Ambulatory Visit: Payer: MEDICARE | Admitting: Internal Medicine

## 2014-10-15 ENCOUNTER — Encounter: Payer: Self-pay | Admitting: Family Medicine

## 2014-10-15 ENCOUNTER — Ambulatory Visit (INDEPENDENT_AMBULATORY_CARE_PROVIDER_SITE_OTHER): Payer: MEDICARE | Admitting: Family Medicine

## 2014-10-15 VITALS — BP 128/80 | HR 60 | Wt 189.0 lb

## 2014-10-15 DIAGNOSIS — N400 Enlarged prostate without lower urinary tract symptoms: Secondary | ICD-10-CM | POA: Diagnosis not present

## 2014-10-15 DIAGNOSIS — B20 Human immunodeficiency virus [HIV] disease: Secondary | ICD-10-CM | POA: Diagnosis not present

## 2014-10-15 DIAGNOSIS — E1169 Type 2 diabetes mellitus with other specified complication: Secondary | ICD-10-CM

## 2014-10-15 DIAGNOSIS — I2510 Atherosclerotic heart disease of native coronary artery without angina pectoris: Secondary | ICD-10-CM

## 2014-10-15 DIAGNOSIS — E119 Type 2 diabetes mellitus without complications: Secondary | ICD-10-CM

## 2014-10-15 DIAGNOSIS — E1159 Type 2 diabetes mellitus with other circulatory complications: Secondary | ICD-10-CM

## 2014-10-15 DIAGNOSIS — E785 Hyperlipidemia, unspecified: Secondary | ICD-10-CM

## 2014-10-15 DIAGNOSIS — I10 Essential (primary) hypertension: Secondary | ICD-10-CM | POA: Diagnosis not present

## 2014-10-15 LAB — POCT GLYCOSYLATED HEMOGLOBIN (HGB A1C): Hemoglobin A1C: 6.2

## 2014-10-15 NOTE — Progress Notes (Signed)
Subjective:    Jeremy Reeves is a 73 y.o. male who presents for follow-up of Type 2 diabetes mellitus.  In January of this year he did have surgery for BPH and urinary retention. He states that he is doing well concerning this. He is followed regularly by his cardiologist as well as by ID. Home blood sugar records: Patient checks B/S one time a day  Current symptoms/problems include: itching a lot past few days B/S been a little high and very tired in the mornings Daily foot checks: Any foot concerns: yes/numb right foot which is a residual of his shingles Last eye exam:  Been a while    Medication compliance: Good Current diet: none splinda! Current exercise: walking x 7 days a week Known diabetic complications: none Cardiovascular risk factors: advanced age (older than 78 for men, 60 for women), diabetes mellitus, dyslipidemia, hypertension, male gender and obesity (BMI >= 30 kg/m2)   ROS as in subjective above    Objective:    General appearence: alert, no distress, WD/WN Residual pigmentary changes noted in the L4 distribution of his leg. He also has slight erythema in the right mid abdominal area. Lab Review Lab Results  Component Value Date   HGBA1C 6.9* 07/05/2013   Lab Results  Component Value Date   CHOL 114 07/31/2014   HDL 39* 07/31/2014   LDLCALC 60 07/31/2014   TRIG 75 07/31/2014   CHOLHDL 2.9 07/31/2014   No results found for: Derl Barrow   Chemistry      Component Value Date/Time   NA 142 07/31/2014 1029   K 3.8 07/31/2014 1029   CL 104 07/31/2014 1029   CO2 26 07/31/2014 1029   BUN 13 07/31/2014 1029   CREATININE 1.39* 07/31/2014 1029   CREATININE 1.41* 10/23/2013 1050      Component Value Date/Time   CALCIUM 9.0 07/31/2014 1029   ALKPHOS 48 07/31/2014 1029   AST 35 07/31/2014 1029   ALT 35 07/31/2014 1029   BILITOT 0.8 07/31/2014 1029        Chemistry      Component Value Date/Time   NA 142 07/31/2014 1029   K 3.8  07/31/2014 1029   CL 104 07/31/2014 1029   CO2 26 07/31/2014 1029   BUN 13 07/31/2014 1029   CREATININE 1.39* 07/31/2014 1029   CREATININE 1.41* 10/23/2013 1050      Component Value Date/Time   CALCIUM 9.0 07/31/2014 1029   ALKPHOS 48 07/31/2014 1029   AST 35 07/31/2014 1029   ALT 35 07/31/2014 1029   BILITOT 0.8 07/31/2014 1029       Hemoglobin A1c 6.2   Assessment:  Diabetes mellitus without complication - Plan: POCT glycosylated hemoglobin (Hb A1C)  ASHD (arteriosclerotic heart disease)  BPH (benign prostatic hyperplasia)  Type 2 diabetes mellitus without complication  HIV disease  Hypertension associated with diabetes  Hyperlipidemia associated with type 2 diabetes mellitus        Plan:    1.  Rx changes: none 2.  Education: Reviewed 'ABCs' of diabetes management (respective goals in parentheses):  A1C (<7), blood pressure (<130/80), and cholesterol (LDL <100). 3.  Compliance at present is estimated to be good. Efforts to improve compliance (if necessary) will be directed at increased exercise. 4. Follow up: 6 months   Recommend cortisone cream for the rash. Also discussed his general care. He can continue with his present care or potentially have me take care of his general health including HIV. He will consider  this.

## 2014-10-24 ENCOUNTER — Other Ambulatory Visit: Payer: MEDICARE

## 2014-10-24 DIAGNOSIS — B20 Human immunodeficiency virus [HIV] disease: Secondary | ICD-10-CM

## 2014-10-24 LAB — CBC WITH DIFFERENTIAL/PLATELET
BASOS ABS: 0 10*3/uL (ref 0.0–0.1)
Basophils Relative: 0 % (ref 0–1)
EOS PCT: 11 % — AB (ref 0–5)
Eosinophils Absolute: 0.5 10*3/uL (ref 0.0–0.7)
HCT: 50.2 % (ref 39.0–52.0)
Hemoglobin: 17.4 g/dL — ABNORMAL HIGH (ref 13.0–17.0)
LYMPHS PCT: 23 % (ref 12–46)
Lymphs Abs: 1.1 10*3/uL (ref 0.7–4.0)
MCH: 28.7 pg (ref 26.0–34.0)
MCHC: 34.7 g/dL (ref 30.0–36.0)
MCV: 82.7 fL (ref 78.0–100.0)
MONO ABS: 0.3 10*3/uL (ref 0.1–1.0)
MPV: 10.6 fL (ref 8.6–12.4)
Monocytes Relative: 7 % (ref 3–12)
NEUTROS ABS: 2.8 10*3/uL (ref 1.7–7.7)
Neutrophils Relative %: 59 % (ref 43–77)
PLATELETS: 109 10*3/uL — AB (ref 150–400)
RBC: 6.07 MIL/uL — ABNORMAL HIGH (ref 4.22–5.81)
RDW: 15.8 % — AB (ref 11.5–15.5)
WBC: 4.7 10*3/uL (ref 4.0–10.5)

## 2014-10-24 LAB — COMPLETE METABOLIC PANEL WITHOUT GFR
ALT: 46 U/L (ref 0–53)
AST: 42 U/L — ABNORMAL HIGH (ref 0–37)
Albumin: 4.2 g/dL (ref 3.5–5.2)
Alkaline Phosphatase: 45 U/L (ref 39–117)
BUN: 11 mg/dL (ref 6–23)
CO2: 30 meq/L (ref 19–32)
Calcium: 9.7 mg/dL (ref 8.4–10.5)
Chloride: 103 meq/L (ref 96–112)
Creat: 1.43 mg/dL — ABNORMAL HIGH (ref 0.50–1.35)
GFR, Est African American: 56 mL/min — ABNORMAL LOW
GFR, Est Non African American: 49 mL/min — ABNORMAL LOW
Glucose, Bld: 103 mg/dL — ABNORMAL HIGH (ref 70–99)
Potassium: 4.4 meq/L (ref 3.5–5.3)
Sodium: 140 meq/L (ref 135–145)
Total Bilirubin: 0.6 mg/dL (ref 0.2–1.2)
Total Protein: 7 g/dL (ref 6.0–8.3)

## 2014-10-25 LAB — T-HELPER CELL (CD4) - (RCID CLINIC ONLY)
CD4 % Helper T Cell: 36 % (ref 33–55)
CD4 T CELL ABS: 340 /uL — AB (ref 400–2700)

## 2014-10-26 LAB — HIV-1 RNA QUANT-NO REFLEX-BLD: HIV 1 RNA Quant: <20 copies/mL (ref <20)

## 2014-11-07 ENCOUNTER — Ambulatory Visit: Payer: MEDICARE | Admitting: Internal Medicine

## 2014-11-13 DIAGNOSIS — N312 Flaccid neuropathic bladder, not elsewhere classified: Secondary | ICD-10-CM | POA: Diagnosis not present

## 2014-11-13 DIAGNOSIS — N401 Enlarged prostate with lower urinary tract symptoms: Secondary | ICD-10-CM | POA: Diagnosis not present

## 2014-11-13 DIAGNOSIS — R351 Nocturia: Secondary | ICD-10-CM | POA: Diagnosis not present

## 2014-11-29 ENCOUNTER — Other Ambulatory Visit: Payer: Self-pay | Admitting: Cardiovascular Disease

## 2014-11-29 NOTE — Telephone Encounter (Signed)
Rx(s) sent to pharmacy electronically.  

## 2014-12-26 ENCOUNTER — Encounter: Payer: Self-pay | Admitting: Internal Medicine

## 2014-12-26 ENCOUNTER — Ambulatory Visit (INDEPENDENT_AMBULATORY_CARE_PROVIDER_SITE_OTHER): Payer: MEDICARE | Admitting: Internal Medicine

## 2014-12-26 ENCOUNTER — Other Ambulatory Visit: Payer: Self-pay | Admitting: Internal Medicine

## 2014-12-26 ENCOUNTER — Telehealth: Payer: Self-pay | Admitting: *Deleted

## 2014-12-26 VITALS — BP 148/77 | HR 83 | Temp 98.3°F | Wt 192.0 lb

## 2014-12-26 DIAGNOSIS — N182 Chronic kidney disease, stage 2 (mild): Secondary | ICD-10-CM

## 2014-12-26 DIAGNOSIS — R229 Localized swelling, mass and lump, unspecified: Secondary | ICD-10-CM | POA: Diagnosis not present

## 2014-12-26 DIAGNOSIS — B20 Human immunodeficiency virus [HIV] disease: Secondary | ICD-10-CM

## 2014-12-26 DIAGNOSIS — B0229 Other postherpetic nervous system involvement: Secondary | ICD-10-CM | POA: Diagnosis not present

## 2014-12-26 DIAGNOSIS — R220 Localized swelling, mass and lump, head: Secondary | ICD-10-CM

## 2014-12-26 NOTE — Progress Notes (Signed)
Patient ID: Jeremy Reeves, male   DOB: 1941/01/29, 74 y.o.   MRN: [id number]       Patient ID: Jeremy Reeves, male   DOB: 1941/06/12, 74 y.o.   MRN: [id number]  HPI 74yo M with HIV disease, cd 4 count fo 340.VL<20, on triumeq. 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 He maintains to do well with his hiv meds and adherence. Denies missing doses. He has been in good state of health. No recent illnesses or hospitalization. He noticed since we last saw him that he has had a bump to behind his left ear, that causes radiates pain to base of skull and neck. The raised lesion itself is not tender.   Outpatient Encounter Prescriptions as of 12/26/2014  Medication Sig  . Abacavir-Dolutegravir-Lamivud (TRIUMEQ) 600-50-300 MG TABS Take 1 tablet by mouth daily.  . carvedilol (COREG) 3.125 MG tablet TAKE 1 TABLET TWICE DAILY WITH MEALS  . Cinnamon 500 MG capsule Take 1,000 mg by mouth daily.  . Multiple Vitamins-Minerals (MULTIVITAMIN WITH MINERALS) tablet Take 1 tablet by mouth daily.    . niacin (NIASPAN) 1000 MG CR tablet Take 2 tablets (2,000 mg total) by mouth at bedtime.  Marland Kitchen omeprazole (PRILOSEC) 20 MG capsule Take 1 capsule (20 mg total) by mouth daily.  . pravastatin (PRAVACHOL) 40 MG tablet Take 1 tablet (40 mg total) by mouth daily.  . vitamin E 400 UNIT capsule Take 400 Units by mouth daily.      Patient Active Problem List   Diagnosis Date Noted  . GERD (gastroesophageal reflux disease) 07/05/2013  . HIV disease 05/24/2013  . Hypertension associated with diabetes 01/25/2013  . ASHD (arteriosclerotic heart disease) 03/18/2011  . BPH (benign prostatic hyperplasia) 03/18/2011  . Hyperlipidemia associated with type 2 diabetes mellitus 03/18/2011  . Diabetes mellitus 03/18/2011     Health Maintenance Due  Topic Date Due  . FOOT EXAM  08/17/1952  . OPHTHALMOLOGY EXAM  08/17/1952  . URINE MICROALBUMIN  08/17/1952  . PNA vac Low Risk Adult (2 of 2 - PCV13) 08/21/2014      Review of Systems  Constitutional: Negative for fever, chills, diaphoresis, activity change, appetite change, fatigue and unexpected weight change.  HENT: Negative for congestion, sore throat, rhinorrhea, sneezing, trouble swallowing and sinus pressure. Palpable mass behind left ear Eyes: Negative for photophobia and visual disturbance.  Respiratory: Negative for cough, chest tightness, shortness of breath, wheezing and stridor.  Cardiovascular: Negative for chest pain, palpitations and leg swelling.  Gastrointestinal: Negative for nausea, vomiting, abdominal pain, diarrhea, constipation, blood in stool, abdominal distention and anal bleeding.  Genitourinary: Negative for dysuria, hematuria, flank pain and difficulty urinating.  Musculoskeletal: Negative for myalgias, back pain, joint swelling, arthralgias and gait problem.  Skin: Negative for color change, pallor, rash and wound.    Physical Exam   BP 148/77 mmHg  Pulse 83  Temp(Src) 98.3 F (36.8 C) (Oral)  Wt 192 lb (87.091 kg) Physical Exam  Constitutional: He is oriented to person, place, and time. He appears well-developed and well-nourished. No distress.  HENT: left side, post auricular mass/nodule measuring 3 x 4cm (length), firm, non-mobile, no erythema or tenderness or warmth Mouth/Throat: Oropharynx is clear and moist. No oropharyngeal exudate.  Cardiovascular: Normal rate, regular rhythm and normal heart sounds. Exam reveals no gallop and no friction rub.  No murmur heard.  Pulmonary/Chest: Effort normal and breath sounds normal. No respiratory distress. He has no wheezes.  Abdominal: Soft. Bowel sounds are normal. He exhibits no  distension. There is no tenderness.  Lymphadenopathy:  He has no cervical adenopathy.  Neurological: He is alert and oriented to person, place, and time.  Skin: Skin is warm and dry. Hyperpigmented scar to right foot from prior zoster infection Psychiatric: He has a normal mood and affect. His  behavior is normal.    Lab Results  Component Value Date   CD4TCELL 36 10/24/2014   Lab Results  Component Value Date   CD4TABS 340* 10/24/2014   CD4TABS 480 07/31/2014   CD4TABS 470 04/09/2014   Lab Results  Component Value Date   HIV1RNAQUANT <20 10/24/2014   Lab Results  Component Value Date   HEPBSAB NEG 05/24/2013   No results found for: RPR  CBC Lab Results  Component Value Date   WBC 4.7 10/24/2014   RBC 6.07* 10/24/2014   HGB 17.4* 10/24/2014   HCT 50.2 10/24/2014   PLT 109* 10/24/2014   MCV 82.7 10/24/2014   MCH 28.7 10/24/2014   MCHC 34.7 10/24/2014   RDW 15.8* 10/24/2014   LYMPHSABS 1.1 10/24/2014   MONOABS 0.3 10/24/2014   EOSABS 0.5 10/24/2014   BASOSABS 0.0 10/24/2014   BMET Lab Results  Component Value Date   NA 140 10/24/2014   K 4.4 10/24/2014   CL 103 10/24/2014   CO2 30 10/24/2014   GLUCOSE 103* 10/24/2014   BUN 11 10/24/2014   CREATININE 1.43* 10/24/2014   CALCIUM 9.7 10/24/2014   GFRNONAA 49* 10/24/2014   GFRAA 56* 10/24/2014     Assessment and Plan  hiv disease = doing well on triomeq. Maintains viral suppression. 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   rtc in 4-6 wk

## 2014-12-26 NOTE — Telephone Encounter (Signed)
After speaking with his wife the patient informed that he wants to hold on the referral for a biopsy of lump on his neck. Advised he has an appt with his PCP in 2 weeks and will discuss with him and give a call back if they change his mind. He advised he has had the knott for 2 years at least and is not very worried about it. Advised will let the doctor know.

## 2014-12-30 ENCOUNTER — Encounter: Payer: Self-pay | Admitting: Family Medicine

## 2014-12-30 ENCOUNTER — Ambulatory Visit (INDEPENDENT_AMBULATORY_CARE_PROVIDER_SITE_OTHER): Payer: MEDICARE | Admitting: Family Medicine

## 2014-12-30 VITALS — BP 140/80 | HR 60 | Wt 190.0 lb

## 2014-12-30 DIAGNOSIS — L723 Sebaceous cyst: Secondary | ICD-10-CM

## 2014-12-30 NOTE — Progress Notes (Signed)
   Subjective:    Patient ID: Jeremy Reeves, male    DOB: 05-06-1941, 74 y.o.   MRN: [id number]  HPI He is here for evaluation of a lesion in the left occipital area.It is not changed and is not giving him any trouble.   Review of Systems     Objective:   Physical Exam  A 3 cm round,smooth Movable lesion is seen in the left occipital area.       Assessment & Plan:  Sebaceous cyst I explained this is most likely cystic and no further intervention needed. He is comfortable with this approach.

## 2015-01-30 ENCOUNTER — Other Ambulatory Visit: Payer: Self-pay | Admitting: Internal Medicine

## 2015-02-13 ENCOUNTER — Ambulatory Visit: Payer: MEDICARE | Admitting: Internal Medicine

## 2015-03-30 ENCOUNTER — Other Ambulatory Visit: Payer: Self-pay | Admitting: Cardiovascular Disease

## 2015-04-16 ENCOUNTER — Encounter: Payer: Self-pay | Admitting: Family Medicine

## 2015-04-16 ENCOUNTER — Ambulatory Visit (INDEPENDENT_AMBULATORY_CARE_PROVIDER_SITE_OTHER): Payer: MEDICARE | Admitting: Family Medicine

## 2015-04-16 VITALS — BP 130/82 | HR 68 | Ht 67.0 in | Wt 189.0 lb

## 2015-04-16 DIAGNOSIS — E1169 Type 2 diabetes mellitus with other specified complication: Secondary | ICD-10-CM | POA: Diagnosis not present

## 2015-04-16 DIAGNOSIS — I10 Essential (primary) hypertension: Secondary | ICD-10-CM | POA: Diagnosis not present

## 2015-04-16 DIAGNOSIS — B353 Tinea pedis: Secondary | ICD-10-CM | POA: Diagnosis not present

## 2015-04-16 DIAGNOSIS — E119 Type 2 diabetes mellitus without complications: Secondary | ICD-10-CM

## 2015-04-16 DIAGNOSIS — I152 Hypertension secondary to endocrine disorders: Secondary | ICD-10-CM

## 2015-04-16 DIAGNOSIS — B0229 Other postherpetic nervous system involvement: Secondary | ICD-10-CM | POA: Diagnosis not present

## 2015-04-16 DIAGNOSIS — B20 Human immunodeficiency virus [HIV] disease: Secondary | ICD-10-CM | POA: Diagnosis not present

## 2015-04-16 DIAGNOSIS — E1159 Type 2 diabetes mellitus with other circulatory complications: Secondary | ICD-10-CM

## 2015-04-16 DIAGNOSIS — E785 Hyperlipidemia, unspecified: Secondary | ICD-10-CM | POA: Diagnosis not present

## 2015-04-16 LAB — POCT GLYCOSYLATED HEMOGLOBIN (HGB A1C): HEMOGLOBIN A1C: 6.1

## 2015-04-16 NOTE — Progress Notes (Signed)
  Subjective:    Patient ID: Jeremy Reeves, male    DOB: [birth year], 74 y.o.   MRN: [id number]  Jeremy Reeves is a 74 y.o. male who presents for follow-up of Type 2 diabetes mellitus.He is also followed for his HIV in the ID clinic.  Home blood sugar records: patient test one time day Current symptoms/problems none Daily foot checks:yes   Any foot concerns: Right foot is numb (PHN) He has had a tingling sensation in the nerve root pattern from his shingles for the last 2 years.He also complains of some whitish maceration between the toes especially at the right fifth toe. 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  ROS as in subjective above.     Objective:    Physical Exam Alert and in no distress .Changes noted in a nerve root pattern on the right. He also has some maceration between the4th and fifth toes. Toenail thickening is also noted.  Lab Review Diabetic Labs Latest Ref Rng 10/24/2014 10/15/2014 07/31/2014 04/09/2014 01/07/2014  HbA1c - - 6.2 - - -  Chol 0 - 200 mg/dL - - 114 - -  HDL >39 mg/dL - - 39(L) - -  Calc LDL 0 - 99 mg/dL - - 60 - -  Triglycerides <150 mg/dL - - 75 - -  Creatinine 0.50 - 1.35 mg/dL 1.43(H) - 1.39(H) 1.54(H) 1.35   BP/Weight 12/30/2014 12/26/2014 10/15/2014 07/31/2014 2/59/5638  Systolic BP 756 433 295 188 416  Diastolic BP 80 77 80 72 81  Wt. (Lbs) 190 192 189 190 193  BMI 29.75 30.06 29.59 29.75 30.22   Hb A1c is 6.1 Jeremy Reeves  reports that he has quit smoking. His smoking use included Cigarettes. He has a 4.8 pack-year smoking history. He has never used smokeless tobacco. He reports that he does not drink alcohol or use illicit drugs.     Assessment & Plan:    Diabetes mellitus without complication - Plan: POCT glycosylated hemoglobin  (Hb A1C), Ambulatory referral to Ophthalmology  HIV disease  Hypertension associated with diabetes  Hyperlipidemia associated with type 2 diabetes mellitus  Postherpetic neuralgia  Tinea pedis of right foot    1. Rx changes: recommend Lamisil AF for the toes. 2. Education: Reviewed 'ABCs' of diabetes management (respective goals in parentheses):  A1C (<7), blood pressure (<130/80), and cholesterol (LDL <100). 3. Compliance at present is estimated to be good. Efforts to improve compliance (if necessary) will be directed at increased exercise. 4. Follow up: 4 months  5. Scuffs treatment of PHN however at this point he is not interested in taking more pills.

## 2015-04-16 NOTE — Patient Instructions (Signed)
Use Lamisil AF or your toes. No barefoot as much as you can

## 2015-04-20 ENCOUNTER — Other Ambulatory Visit: Payer: Self-pay | Admitting: Cardiovascular Disease

## 2015-04-22 NOTE — Telephone Encounter (Signed)
Rx(s) sent to pharmacy electronically.  

## 2015-06-01 ENCOUNTER — Other Ambulatory Visit: Payer: Self-pay | Admitting: Cardiovascular Disease

## 2015-06-02 NOTE — Telephone Encounter (Signed)
Rx(s) sent to pharmacy electronically.  

## 2015-06-04 DIAGNOSIS — N138 Other obstructive and reflux uropathy: Secondary | ICD-10-CM | POA: Diagnosis not present

## 2015-06-04 DIAGNOSIS — N401 Enlarged prostate with lower urinary tract symptoms: Secondary | ICD-10-CM | POA: Diagnosis not present

## 2015-06-04 DIAGNOSIS — N312 Flaccid neuropathic bladder, not elsewhere classified: Secondary | ICD-10-CM | POA: Diagnosis not present

## 2015-06-09 DIAGNOSIS — H2513 Age-related nuclear cataract, bilateral: Secondary | ICD-10-CM | POA: Diagnosis not present

## 2015-06-09 DIAGNOSIS — H40013 Open angle with borderline findings, low risk, bilateral: Secondary | ICD-10-CM | POA: Diagnosis not present

## 2015-06-09 LAB — HM DIABETES EYE EXAM

## 2015-06-10 IMAGING — CT CT HEAD W/O CM
1 series · 16 of 30 positions shown, 20 images · non-contrast
Comparison: 01/29/2011.

CLINICAL DATA: 71-year-old male with visual hallucinations.

EXAM:
CT HEAD WITHOUT CONTRAST
TECHNIQUE: Contiguous axial images were obtained from the base of the skull
through the vertex without intravenous contrast.

[Series 2: head 5.0 h30s · axial · 0.40mm/px · z∈[+1289,+1429]mm · 16 of 32 slices shown, 20 images]
[im 2/32  brain]
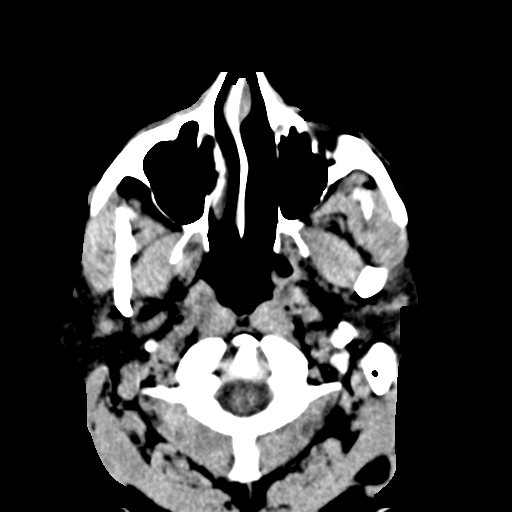
[im 2/32  bone]
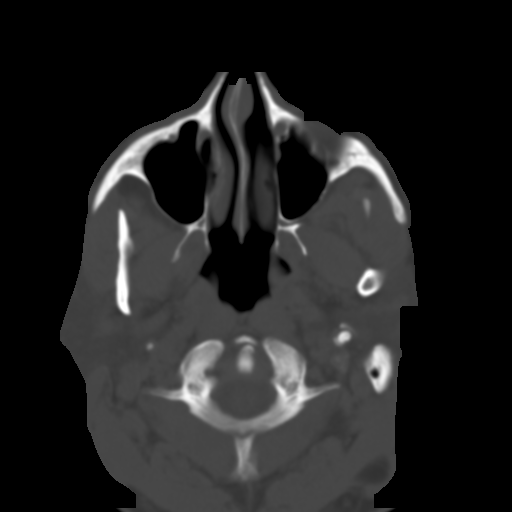
[im 4/32  brain]
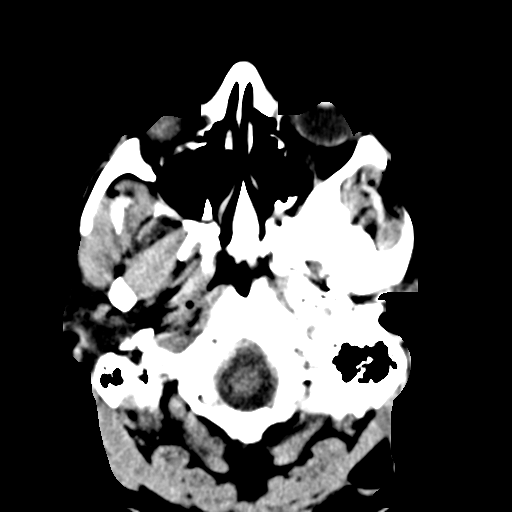
[im 6/32  brain]
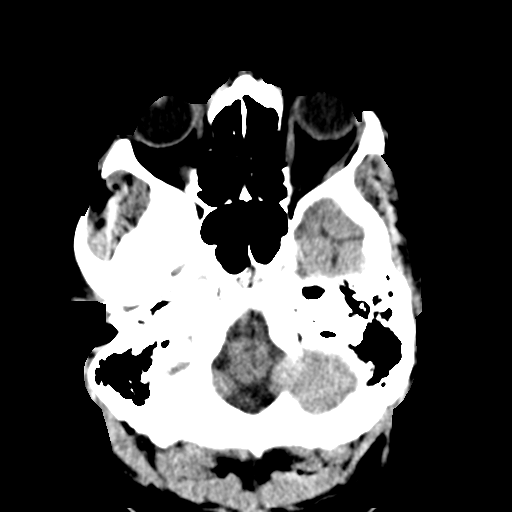
[im 8/32  brain]
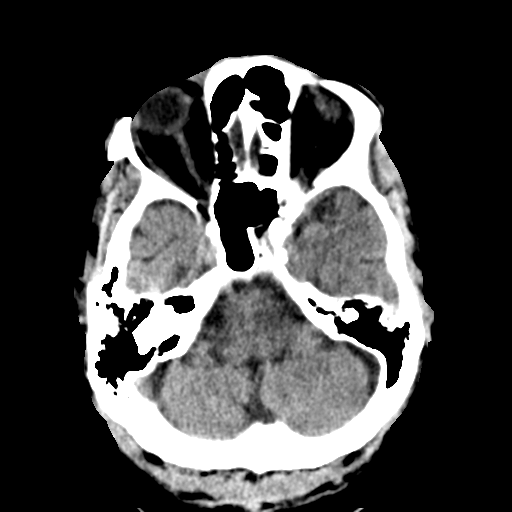
[im 9/32  brain]
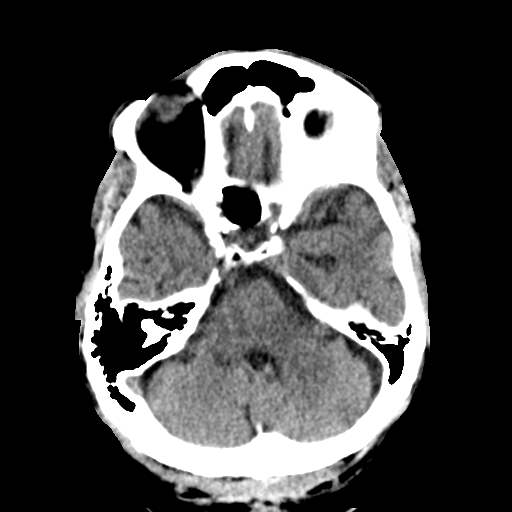
[im 9/32  bone]
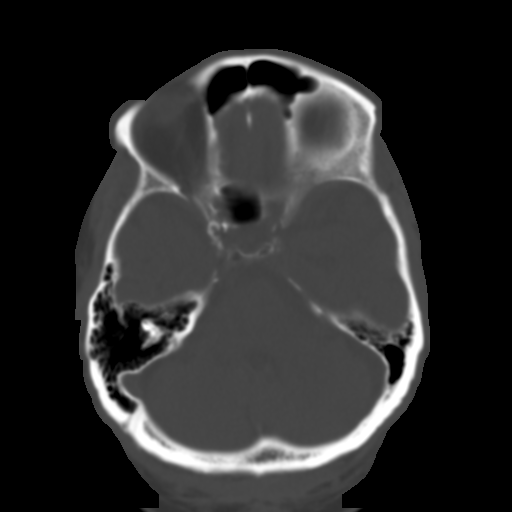
[im 11/32  brain]
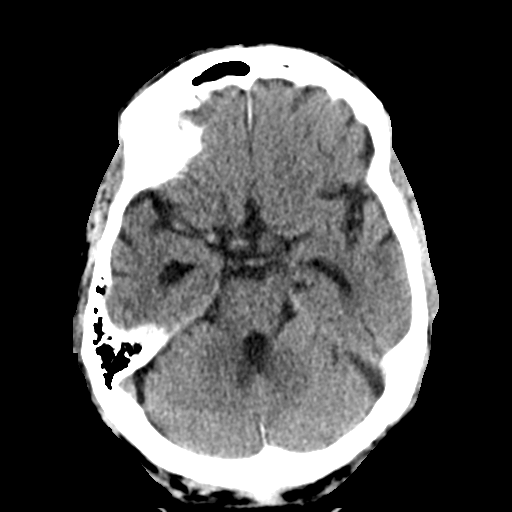
[im 13/32  brain]
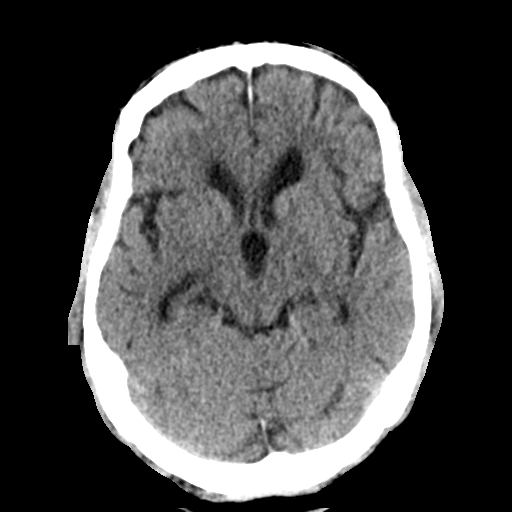
[im 15/32  brain]
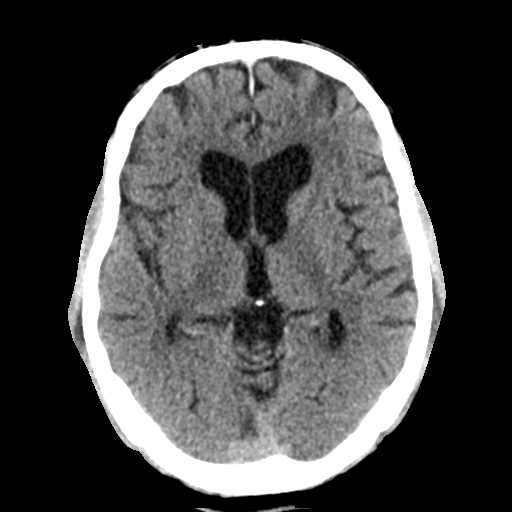
[im 17/32  brain]
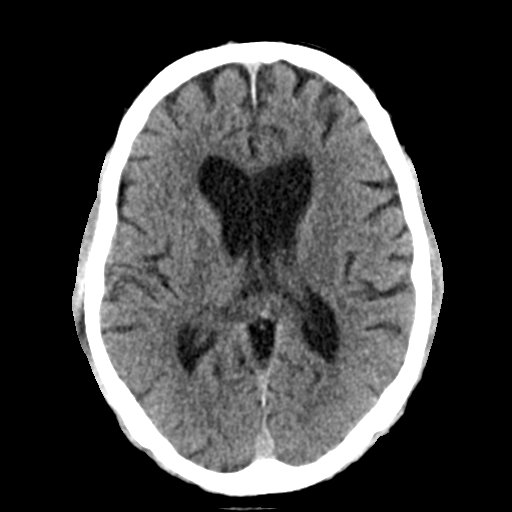
[im 17/32  bone]
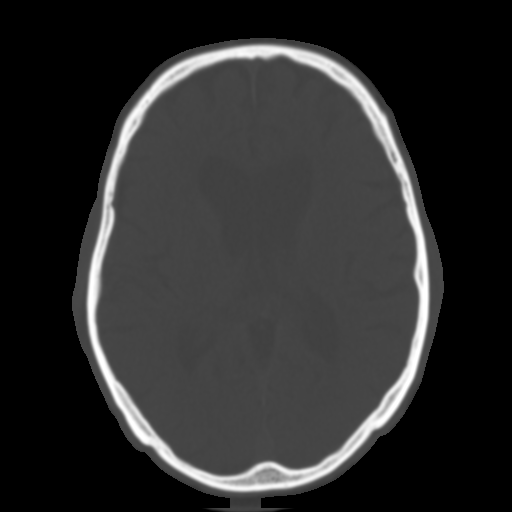
[im 19/32  brain]
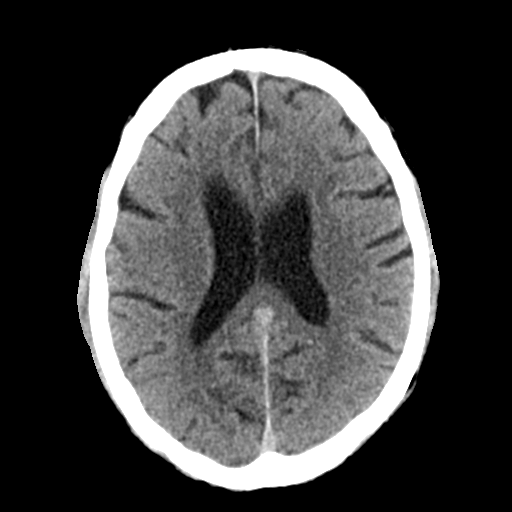
[im 21/32  brain]
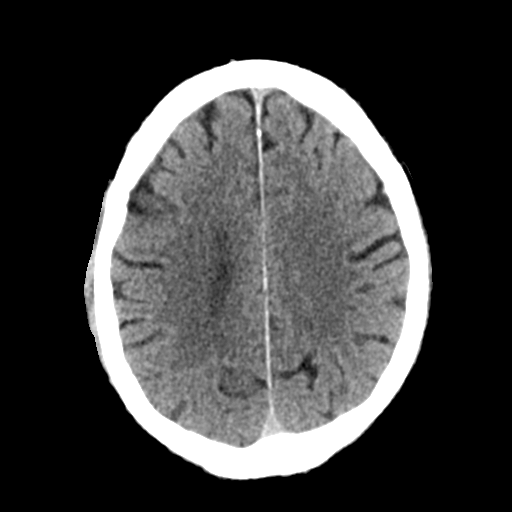
[im 23/32  brain]
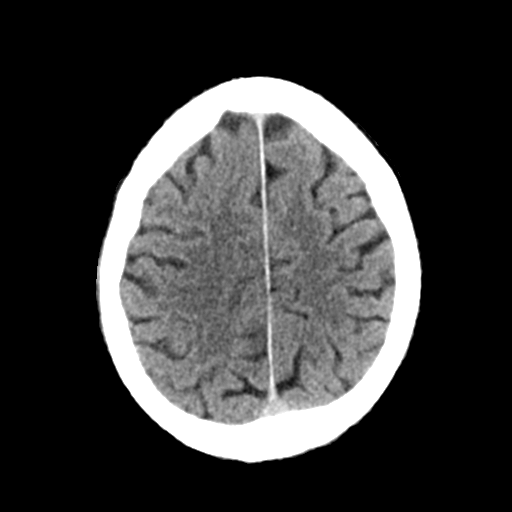
[im 24/32  brain]
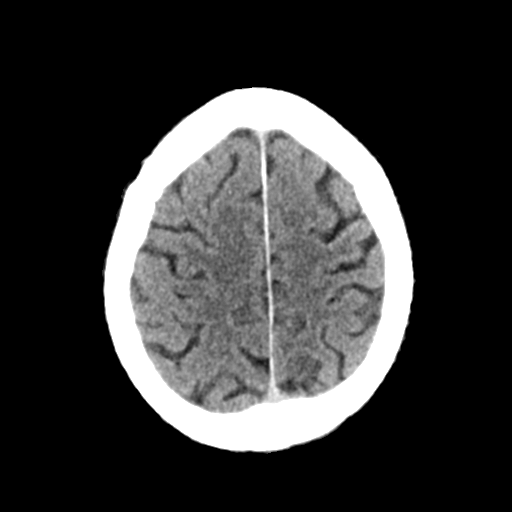
[im 24/32  bone]
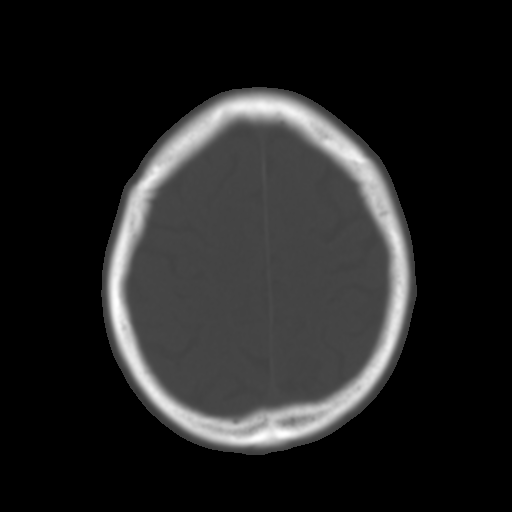
[im 26/32  brain]
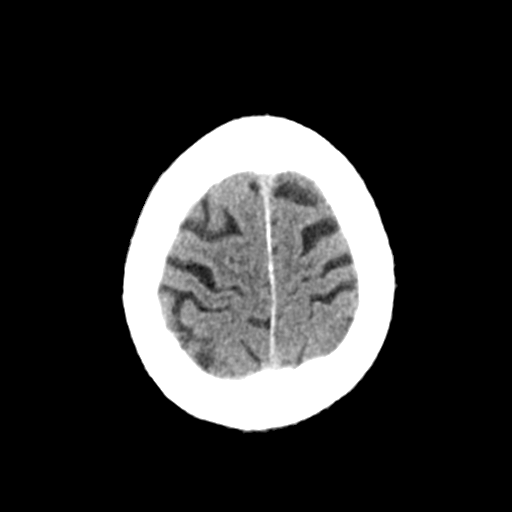
[im 28/32  brain]
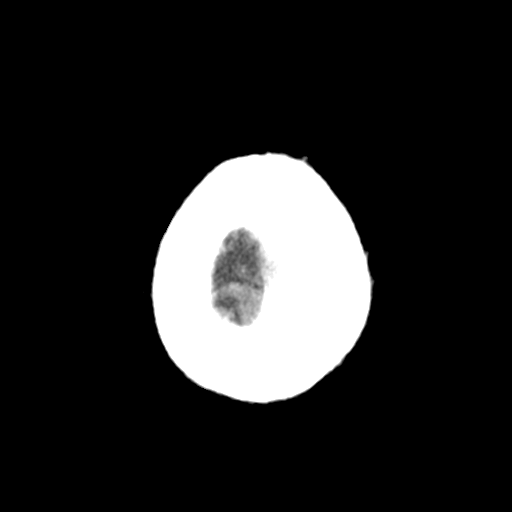
[im 30/32  brain]
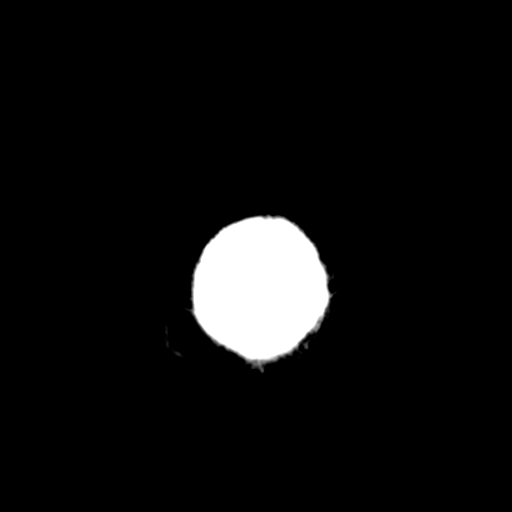

[16 of 30 positions shown; findings below may reference images not displayed]

FINDINGS: Visualized paranasal sinuses and mastoids are clear. Stable and
negative non contrast CT appearance of the orbits. Visualized scalp
soft tissues are within normal limits. No acute osseous abnormality
identified.

Calcified atherosclerosis at the skull base. Cerebral volume is not
significantly changed. Chronic dural calcifications. No
ventriculomegaly. No midline shift, mass effect, or evidence of
intracranial mass lesion. No evidence of cortically based acute
infarction identified. No acute intracranial hemorrhage identified.
No suspicious intracranial vascular hyperdensity.
IMPRESSION: Stable and normal for age non contrast CT appearance of the brain.

## 2015-06-10 IMAGING — US US RENAL
1 series · 10 of 10 positions shown · non-contrast
Comparison: 10/04/2006

CLINICAL DATA: Acute renal failure

EXAM:
RENAL/URINARY TRACT ULTRASOUND COMPLETE

[Series 1: us renal · 0.24mm/px · 10 of 10 slices shown]
[im 1/10]
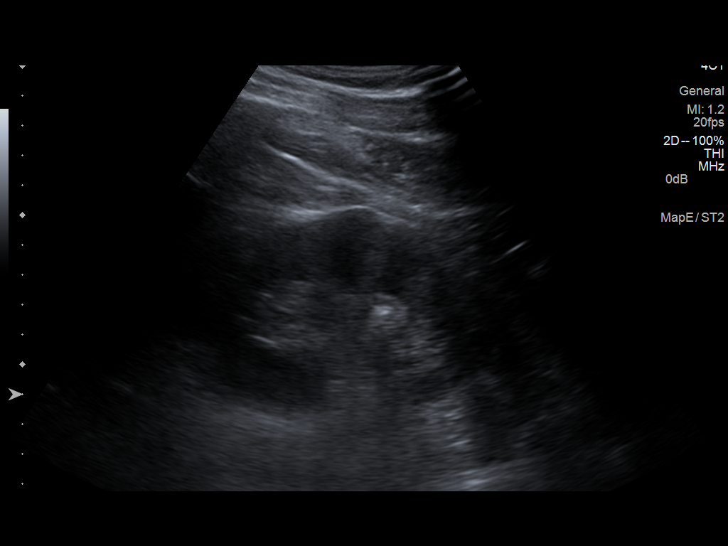
[im 2/10]
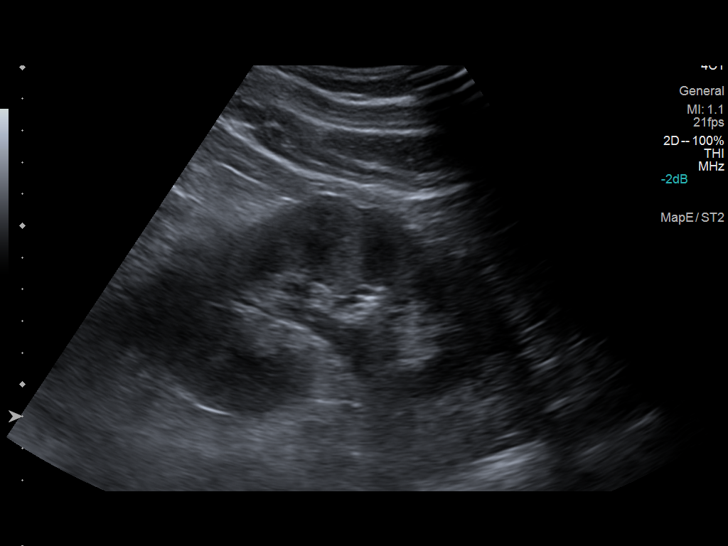
[im 3/10]
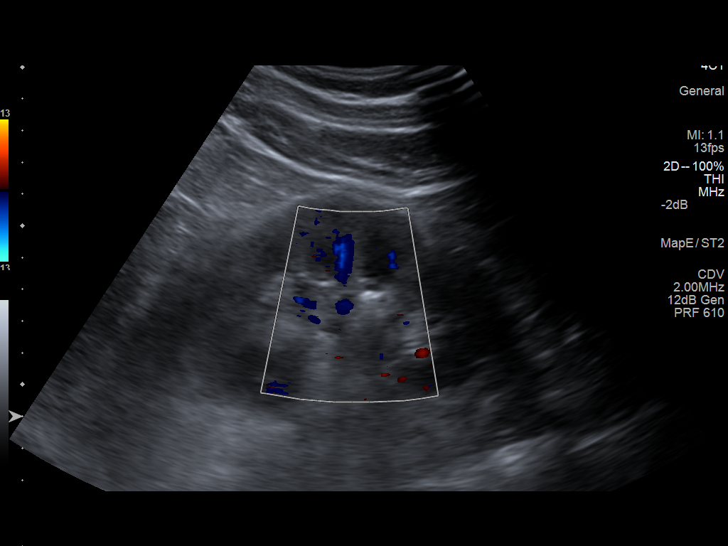
[im 4/10]
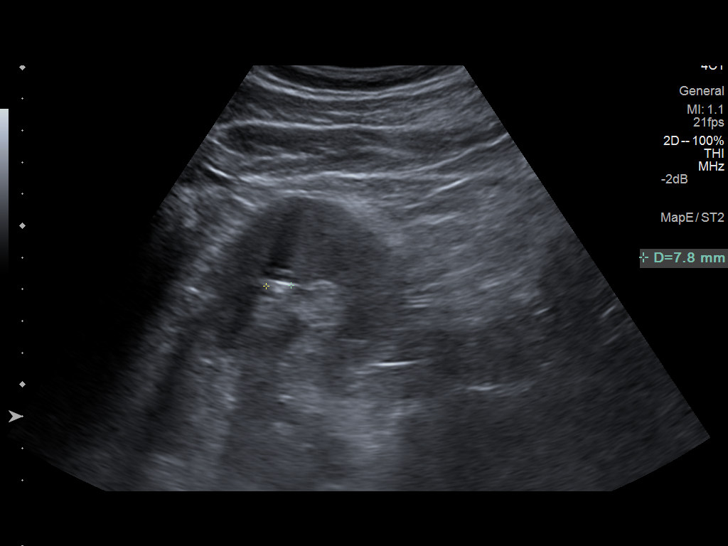
[im 5/10]
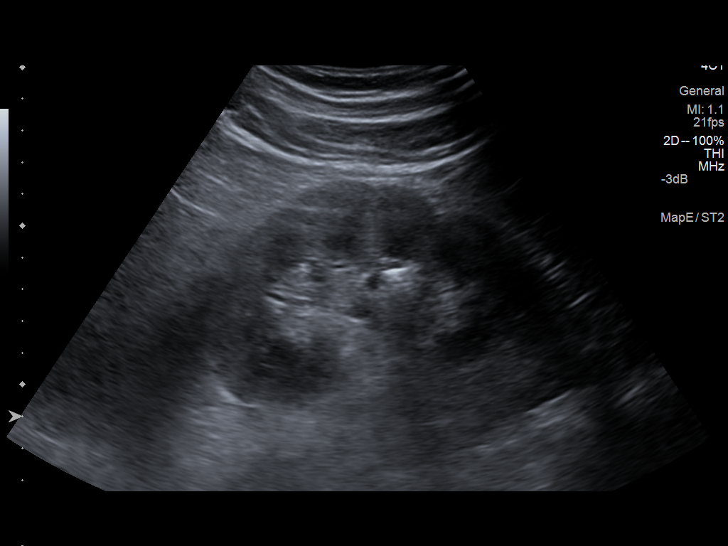
[im 6/10]
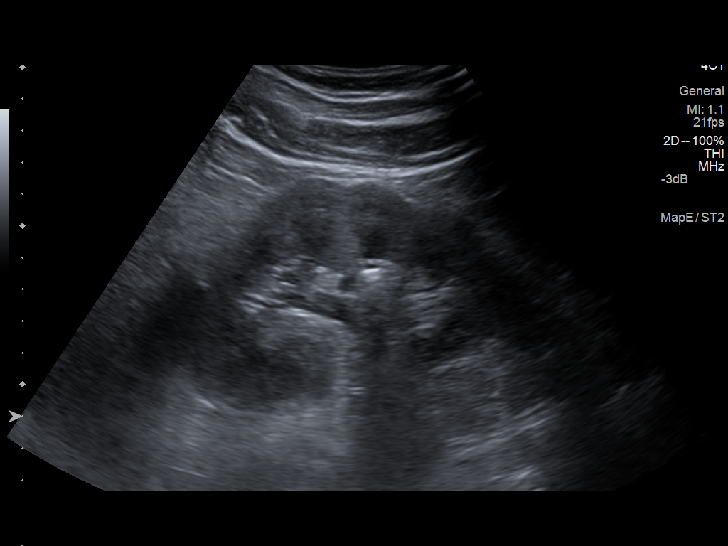
[im 7/10]
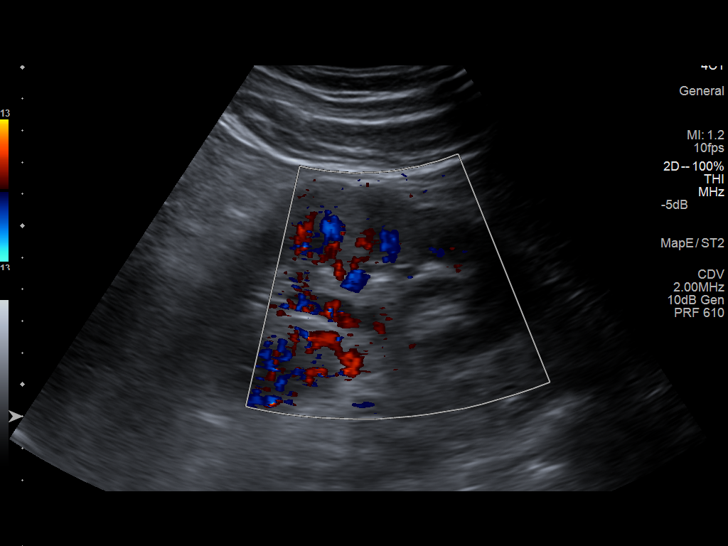
[im 8/10]
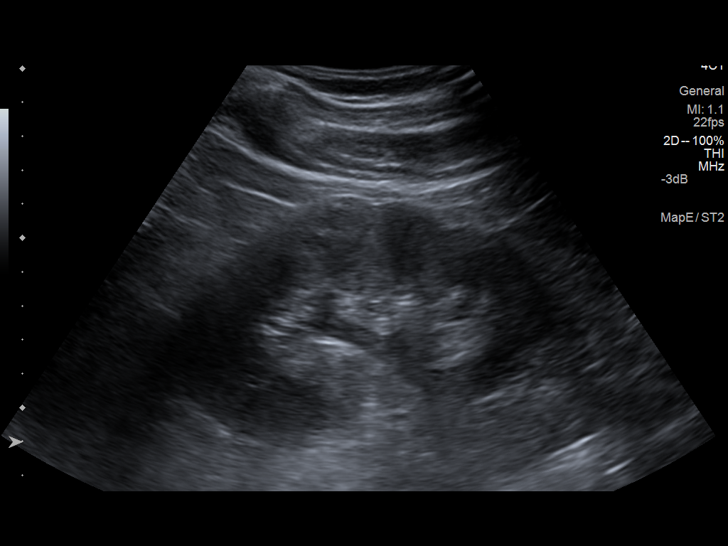
[im 9/10]
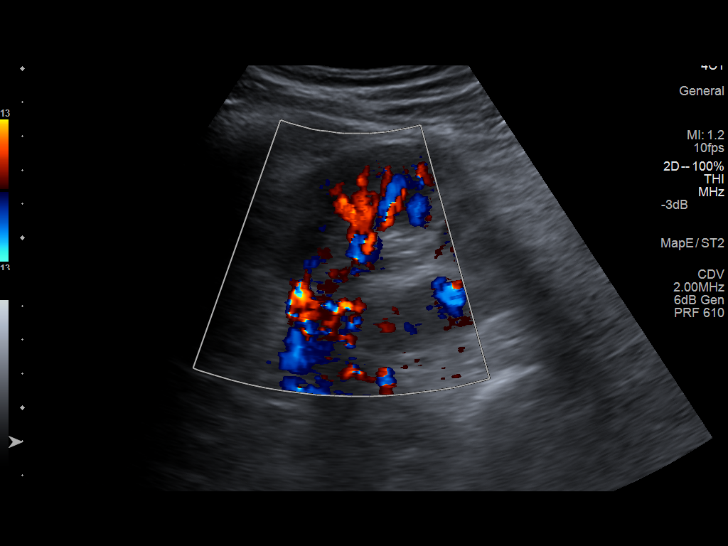
[im 10/10]
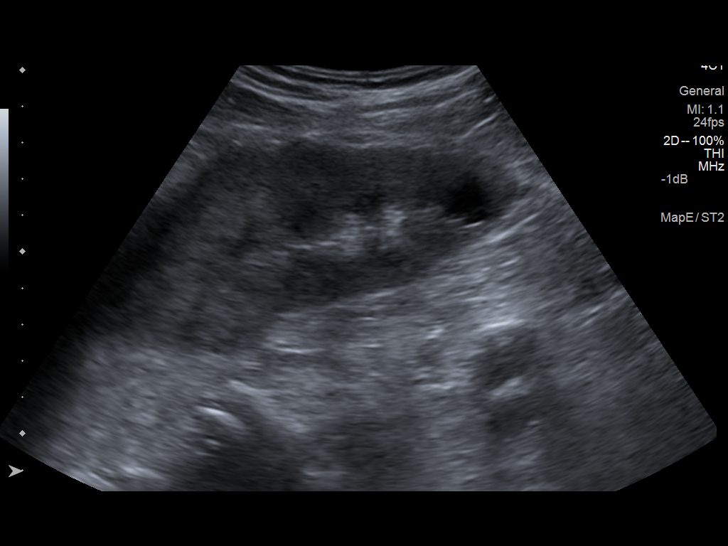

[10 of 10 positions shown; findings below may reference images not displayed]

FINDINGS: Right Kidney

Length: Measures 12 cm. Echogenicity diffusely increased. There is
fullness of the collecting system, particularly the upper pole. Tiny
cyst in the upper pole, measuring 7 mm. Scant perinephric edema,
possibly associated with the acute renal failure.

Left Kidney

Length: Measures 12 cm. Diffuse increased echogenicity. There is a
lower pole cyst measuring 1.8 cm. Scant perinephric edema, possibly
associated with the acute renal failure. 8mm echogenic focus in the
interpolar region, without shadowing or ring down artifact.

Bladder:  Completely decompressed around the Foley catheter.

Other: The 18 mm ovoid hyperechoic lesion within the upper liver,
likely right lobe.
IMPRESSION: 1. Mildly echogenic kidneys suggesting an background medical renal
disease.
2. Fullness of the bilateral collecting systems, but doubt urinary
obstruction. Followup could ensure normalization.
3. Probable 8mm calculus in the interpolar left kidney, also seen in
3335.
4. Nonspecific 1.8 cm echogenic lesion in the right lobe liver.
Recommend outpatient MRI, especially if there is history of chronic
liver disease.

## 2015-06-10 IMAGING — CR DG ABDOMEN 1V
1 series · 1 of 1 positions shown · non-contrast
Comparison: None.

CLINICAL DATA: Nausea.  Vomiting yesterday.

ABDOMEN - 1 VIEW

[t abdomen supine]
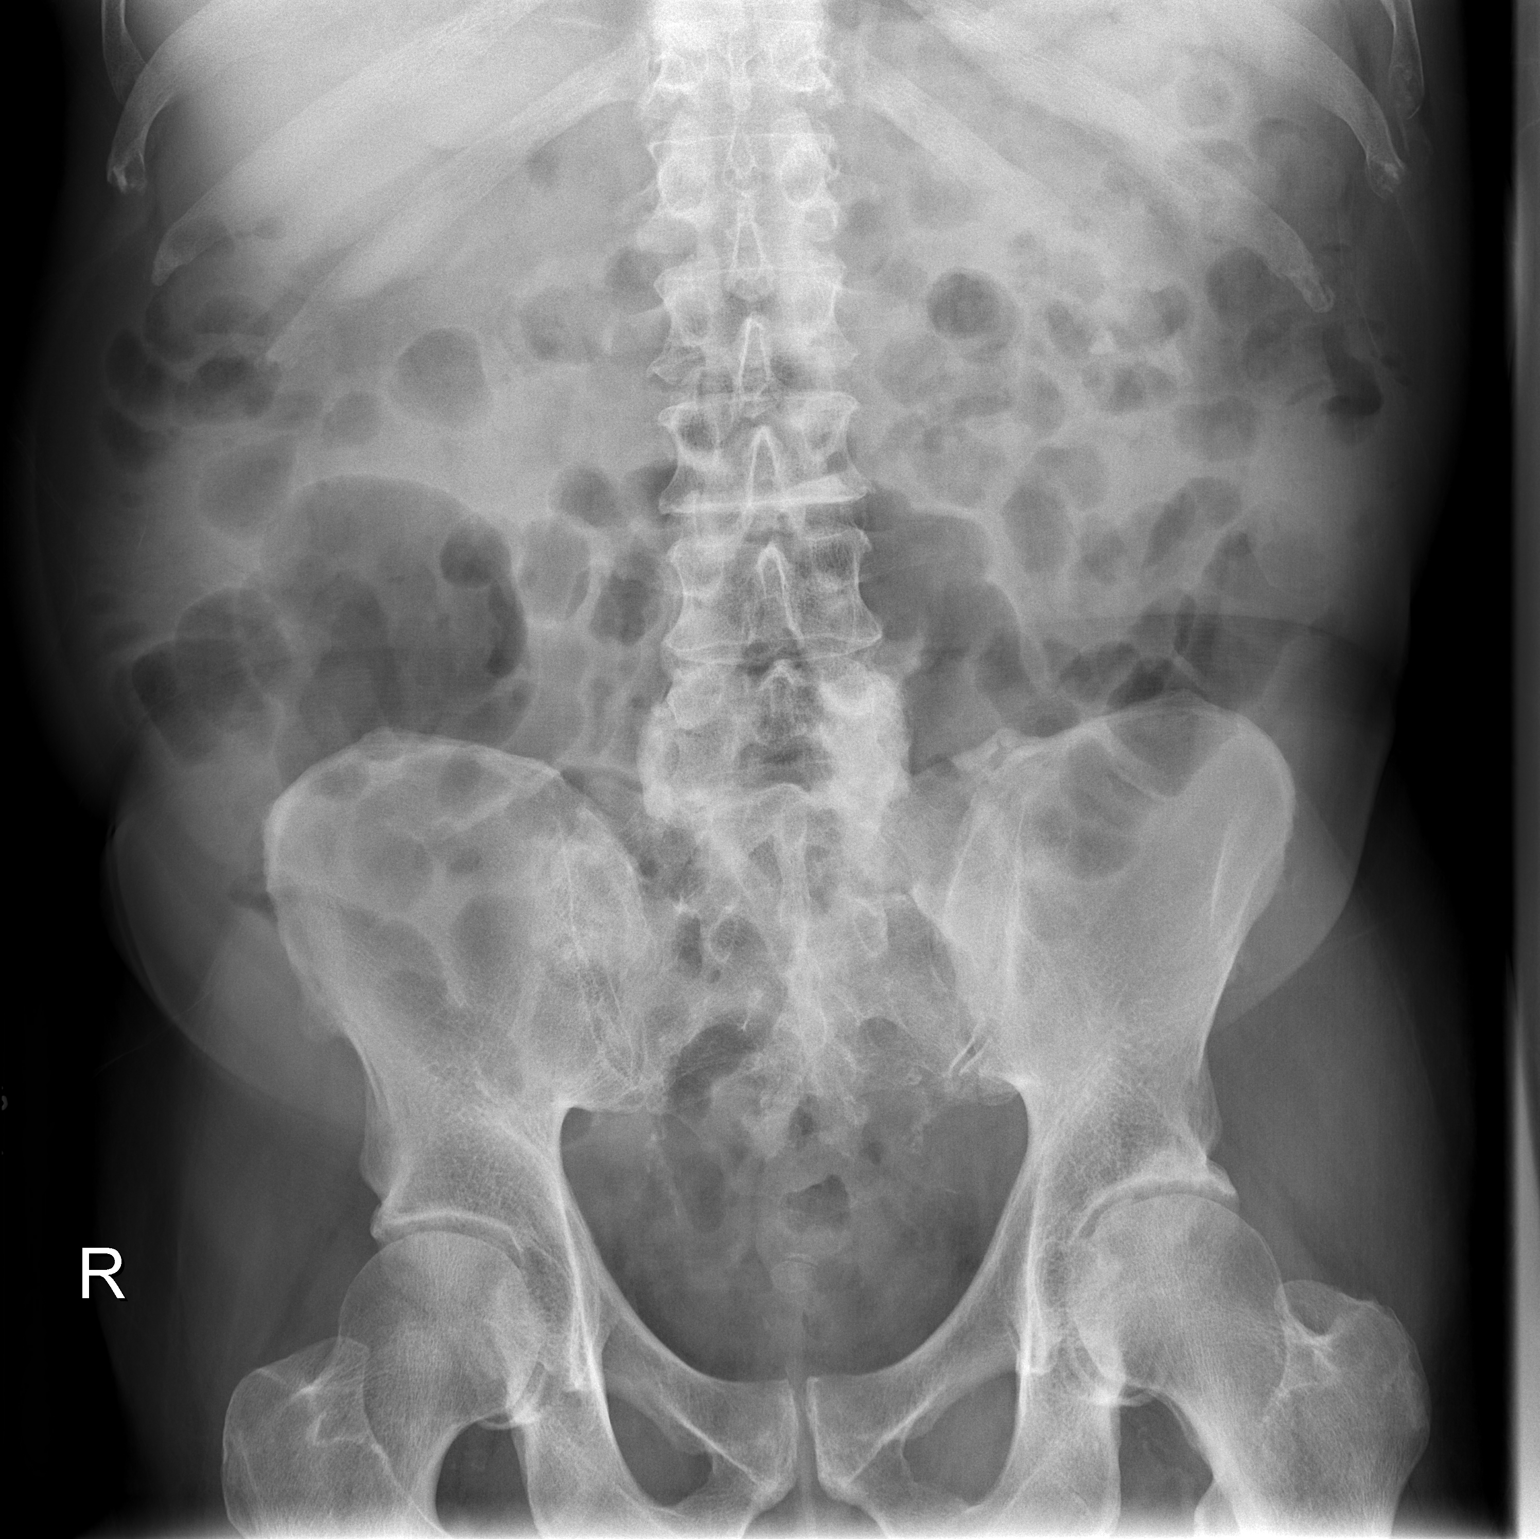

[1 of 1 positions shown; findings below may reference images not displayed]

FINDINGS: The bowel gas pattern is nonobstructive.  No gross plain
film evidence of free air.  Stool and bowel gas extends to the
rectosigmoid.  Increased densities at L5-S1 suggesting prior
posterior lumbar fusion. Calcification projects over the left mid
abdomen, likely representing an 9 mm renal collecting system
calculus which was also seen on prior lumbar spine radiograph.  No
ureteral calculi identified.  Atherosclerosis.
IMPRESSION: Normal bowel gas pattern.  9 mm left interpolar renal collecting
system calculus.

## 2015-06-15 ENCOUNTER — Other Ambulatory Visit: Payer: Self-pay | Admitting: Internal Medicine

## 2015-06-15 DIAGNOSIS — B20 Human immunodeficiency virus [HIV] disease: Secondary | ICD-10-CM

## 2015-06-20 ENCOUNTER — Other Ambulatory Visit: Payer: Self-pay | Admitting: Cardiovascular Disease

## 2015-06-24 ENCOUNTER — Encounter (HOSPITAL_COMMUNITY): Payer: Self-pay | Admitting: Emergency Medicine

## 2015-06-24 ENCOUNTER — Emergency Department (HOSPITAL_COMMUNITY)
Admission: EM | Admit: 2015-06-24 | Discharge: 2015-06-24 | Disposition: A | Discharge status: Home / Self Care | Payer: MEDICARE | Attending: Emergency Medicine | Admitting: Emergency Medicine

## 2015-06-24 DIAGNOSIS — K219 Gastro-esophageal reflux disease without esophagitis: Secondary | ICD-10-CM | POA: Diagnosis not present

## 2015-06-24 DIAGNOSIS — Z9889 Other specified postprocedural states: Secondary | ICD-10-CM | POA: Diagnosis not present

## 2015-06-24 DIAGNOSIS — Z87438 Personal history of other diseases of male genital organs: Secondary | ICD-10-CM | POA: Insufficient documentation

## 2015-06-24 DIAGNOSIS — I10 Essential (primary) hypertension: Secondary | ICD-10-CM | POA: Insufficient documentation

## 2015-06-24 DIAGNOSIS — Z79899 Other long term (current) drug therapy: Secondary | ICD-10-CM | POA: Diagnosis not present

## 2015-06-24 DIAGNOSIS — Z87891 Personal history of nicotine dependence: Secondary | ICD-10-CM | POA: Insufficient documentation

## 2015-06-24 DIAGNOSIS — W5911XA Bitten by nonvenomous snake, initial encounter: Secondary | ICD-10-CM | POA: Diagnosis not present

## 2015-06-24 DIAGNOSIS — Y9389 Activity, other specified: Secondary | ICD-10-CM | POA: Insufficient documentation

## 2015-06-24 DIAGNOSIS — I2510 Atherosclerotic heart disease of native coronary artery without angina pectoris: Secondary | ICD-10-CM | POA: Diagnosis not present

## 2015-06-24 DIAGNOSIS — S31815A Open bite of right buttock, initial encounter: Secondary | ICD-10-CM | POA: Diagnosis not present

## 2015-06-24 DIAGNOSIS — Y9289 Other specified places as the place of occurrence of the external cause: Secondary | ICD-10-CM | POA: Insufficient documentation

## 2015-06-24 DIAGNOSIS — E785 Hyperlipidemia, unspecified: Secondary | ICD-10-CM | POA: Diagnosis not present

## 2015-06-24 DIAGNOSIS — Z21 Asymptomatic human immunodeficiency virus [HIV] infection status: Secondary | ICD-10-CM | POA: Insufficient documentation

## 2015-06-24 DIAGNOSIS — Z951 Presence of aortocoronary bypass graft: Secondary | ICD-10-CM | POA: Diagnosis not present

## 2015-06-24 DIAGNOSIS — Y998 Other external cause status: Secondary | ICD-10-CM | POA: Insufficient documentation

## 2015-06-24 DIAGNOSIS — E119 Type 2 diabetes mellitus without complications: Secondary | ICD-10-CM | POA: Diagnosis not present

## 2015-06-24 DIAGNOSIS — Z8619 Personal history of other infectious and parasitic diseases: Secondary | ICD-10-CM | POA: Diagnosis not present

## 2015-06-24 DIAGNOSIS — T63001A Toxic effect of unspecified snake venom, accidental (unintentional), initial encounter: Secondary | ICD-10-CM

## 2015-06-24 NOTE — ED Notes (Signed)
Per pt, states he was bit on right buttock by baby snake-felt a little sting when he sat on porch-brought snake with him

## 2015-06-24 NOTE — Discharge Instructions (Signed)

## 2015-06-24 NOTE — ED Provider Notes (Signed)
CSN: [id number]     Arrival date & time 06/24/15  1056 History   First MD Initiated Contact with Patient 06/24/15 1208     Chief Complaint  Patient presents with  . Snake Bite     (Consider location/radiation/quality/duration/timing/severity/associated sxs/prior Treatment) HPI   Jeremy Reeves is a 74 y.o. male presents for evaluation of possible snakebite. He sat down on a board and saw a snake, which she had sat on. He felt a slight stinging sensation in the right buttock. This incident occurred at 9:30 AM this morning. He does not have pain in the right buttock, weakness, dizziness, nausea, vomiting or chest pain. He brought the snake in. There are no other known modifying factors.  Past Medical History  Diagnosis Date  . ASHD (arteriosclerotic heart disease)   . Hyperlipidemia   . BPH (benign prostatic hyperplasia)   . Hiatal hernia   . HH (hiatus hernia)   . HIV (human immunodeficiency virus infection) dx'd 2014  . GERD (gastroesophageal reflux disease)   . Hypertension   . Type II diabetes mellitus     pre diabetes  . Shingles 8/14  . Foley catheter in place 10/23/12  . Difficult intubation     anesthesia record 1999 / record on chart   Past Surgical History  Procedure Laterality Date  . Colonoscopy  2004  . Transurethral resection of prostate N/A 10/25/2013    Procedure: TRANSURETHRAL RESECTION OF THE PROSTATE WITH GYRUS BUTTON;  Surgeon: Irine Seal, MD;  Location: [facility];  Service: Urology;  Laterality: N/A;  . Cardiac catheterization  05/02/1998    Recommend CABG  . 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 LIMA to the LAD, vein to obtuse marginal, and vein to RCA  . Cardiovascular stress test  12/01/2011    Perfusion defect consistent with diaphragmatic attenuation. There is no scintigraphic of inducible myocardial ischemia.   . Transthoracic echocardiogram  03/29/2011    EF ~50%, mild-moderate inferior wall hypokinesis   Family History  Problem Relation Age of  Onset  . Kidney disease Mother   . Heart disease Father   . Hypertension Brother    Social History  Substance Use Topics  . Smoking status: Former Smoker -- 0.12 packs/day for 40 years    Types: Cigarettes  . Smokeless tobacco: Never Used     Comment: 07/04/2013 "I quit smoking before 2000"  . Alcohol Use: No    Review of Systems  All other systems reviewed and are negative.     Allergies  Peanut-containing drug products  Home Medications   Prior to Admission medications   Medication Sig Start Date End Date Taking? Authorizing Provider  carvedilol (COREG) 3.125 MG tablet TAKE 1 TABLET TWICE DAILY WITH MEALS 11/29/14   Lorretta Harp, MD  Cinnamon 500 MG capsule Take 1,000 mg by mouth daily.    Historical Provider, MD  Multiple Vitamins-Minerals (MULTIVITAMIN WITH MINERALS) tablet Take 1 tablet by mouth daily.      Historical Provider, MD  niacin (NIASPAN) 1000 MG CR tablet Take 2 tablets (2,000 mg total) by mouth at bedtime. <PLEASE MAKE APPOINTMENT FOR REFILLS> 04/22/15   Lorretta Harp, MD  omeprazole (PRILOSEC) 20 MG capsule Take 1 capsule (20 mg total) by mouth daily. PATIENT NEEDS TO CONTACT OFFICE FOR ADDITIONAL REFILLS 06/02/15   Lorretta Harp, MD  pravastatin (PRAVACHOL) 40 MG tablet TAKE 1 TABLET EVERY DAY 01/31/15   Carlyle Basques, MD  TRIUMEQ 600-50-300 MG TABS TAKE  1 TABLET BY MOUTH EVERY DAY 06/16/15   Carlyle Basques, MD  vitamin E 400 UNIT capsule Take 400 Units by mouth daily.     Historical Provider, MD   BP 154/99 mmHg  Pulse 87  Temp(Src) 97.7 F (36.5 C) (Oral)  Resp 16  SpO2 100% Physical Exam  Constitutional: He is oriented to person, place, and time. He appears well-developed and well-nourished.  HENT:  Head: Normocephalic and atraumatic.  Right Ear: External ear normal.  Left Ear: External ear normal.  Eyes: Conjunctivae and EOM are normal. Pupils are equal, round, and reactive to light.  Neck: Normal range of motion and phonation normal. Neck  supple.  Cardiovascular: Normal rate and normal heart sounds.   Pulmonary/Chest: Effort normal. He exhibits no bony tenderness.  Abdominal: Soft. There is no tenderness.  Musculoskeletal: Normal range of motion.  Neurological: He is alert and oriented to person, place, and time. No cranial nerve deficit or sensory deficit. He exhibits normal muscle tone. Coordination normal.  Skin: Skin is warm, dry and intact.  Reported area of possible bite, on the right buttock is negative for any abnormal findings. He has intact light touch sensation in this region.  Psychiatric: He has a normal mood and affect. His behavior is normal. Judgment and thought content normal.  Nursing note and vitals reviewed.   ED Course  Procedures (including critical care time)  Findings discussed with patient, all questions were answered.  Labs Review Labs Reviewed - No data to display  Imaging Review No results found. I have personally reviewed and evaluated these images and lab results as part of my medical decision-making.   EKG Interpretation None      MDM   Final diagnoses:  Snake bite, accidental or unintentional, initial encounter    Contact with a snake, possible bite, highly unlikely to be an envenomation.  Nursing Notes Reviewed/ Care Coordinated Applicable Imaging Reviewed Interpretation of Laboratory Data incorporated into ED treatment  The patient appears reasonably screened and/or stabilized for discharge and I doubt any other medical condition or other [facility] requiring further screening, evaluation, or treatment in the ED at this time prior to discharge.  Plan: Home Medications- usual; Home Treatments- rest; return here if the recommended treatment, does not improve the symptoms; Recommended follow up- return for problems    Daleen Bo, MD 06/24/15 1223

## 2015-07-12 ENCOUNTER — Other Ambulatory Visit: Payer: Self-pay | Admitting: Cardiovascular Disease

## 2015-07-22 ENCOUNTER — Telehealth: Payer: Self-pay | Admitting: Cardiovascular Disease

## 2015-07-22 MED ORDER — NIACIN ER (ANTIHYPERLIPIDEMIC) 1000 MG PO TBCR: 2000.0000 mg | EXTENDED_RELEASE_TABLET | Freq: Every day | ORAL | Status: DC

## 2015-07-22 NOTE — Telephone Encounter (Signed)
Rx(s) sent to pharmacy electronically. Patient notified he needs OV - scheduled to see [name] 09/09/15

## 2015-07-22 NOTE — Telephone Encounter (Signed)
°  1. Which medications need to be refilled? Niacin ER  2. Which pharmacy is medication to be sent to?[facility]  3. Do they need a 30 day or 90 day supply? 90 and refills 4. Would they like a call back once the medication has been sent to the pharmacy? yes

## 2015-08-02 ENCOUNTER — Other Ambulatory Visit: Payer: Self-pay | Admitting: Cardiovascular Disease

## 2015-08-12 ENCOUNTER — Ambulatory Visit (INDEPENDENT_AMBULATORY_CARE_PROVIDER_SITE_OTHER): Payer: MEDICARE | Admitting: Family Medicine

## 2015-08-12 ENCOUNTER — Encounter: Payer: Self-pay | Admitting: Family Medicine

## 2015-08-12 VITALS — BP 120/70 | HR 78 | Ht 66.0 in | Wt 191.0 lb

## 2015-08-12 DIAGNOSIS — Z79899 Other long term (current) drug therapy: Secondary | ICD-10-CM

## 2015-08-12 DIAGNOSIS — E1169 Type 2 diabetes mellitus with other specified complication: Secondary | ICD-10-CM

## 2015-08-12 DIAGNOSIS — B0229 Other postherpetic nervous system involvement: Secondary | ICD-10-CM | POA: Diagnosis not present

## 2015-08-12 DIAGNOSIS — E785 Hyperlipidemia, unspecified: Secondary | ICD-10-CM

## 2015-08-12 DIAGNOSIS — B20 Human immunodeficiency virus [HIV] disease: Secondary | ICD-10-CM | POA: Diagnosis not present

## 2015-08-12 DIAGNOSIS — B353 Tinea pedis: Secondary | ICD-10-CM | POA: Diagnosis not present

## 2015-08-12 DIAGNOSIS — Z23 Encounter for immunization: Secondary | ICD-10-CM | POA: Diagnosis not present

## 2015-08-12 DIAGNOSIS — I10 Essential (primary) hypertension: Secondary | ICD-10-CM

## 2015-08-12 DIAGNOSIS — E1159 Type 2 diabetes mellitus with other circulatory complications: Secondary | ICD-10-CM | POA: Diagnosis not present

## 2015-08-12 DIAGNOSIS — E119 Type 2 diabetes mellitus without complications: Secondary | ICD-10-CM

## 2015-08-12 LAB — COMPREHENSIVE METABOLIC PANEL
ALBUMIN: 4.2 g/dL (ref 3.6–5.1)
ALT: 33 U/L (ref 9–46)
AST: 32 U/L (ref 10–35)
Alkaline Phosphatase: 57 U/L (ref 40–115)
BILIRUBIN TOTAL: 0.7 mg/dL (ref 0.2–1.2)
BUN: 14 mg/dL (ref 7–25)
CO2: 29 mmol/L (ref 20–31)
CREATININE: 1.25 mg/dL — AB (ref 0.70–1.18)
Calcium: 9.2 mg/dL (ref 8.6–10.3)
Chloride: 99 mmol/L (ref 98–110)
Glucose, Bld: 103 mg/dL — ABNORMAL HIGH (ref 65–99)
Potassium: 3.8 mmol/L (ref 3.5–5.3)
SODIUM: 139 mmol/L (ref 135–146)
TOTAL PROTEIN: 7.4 g/dL (ref 6.1–8.1)

## 2015-08-12 LAB — LIPID PANEL
CHOLESTEROL: 140 mg/dL (ref 125–200)
HDL: 47 mg/dL (ref >=40)
LDL Cholesterol: 77 mg/dL (ref <130)
TRIGLYCERIDES: 80 mg/dL (ref <150)
Total CHOL/HDL Ratio: 3 Ratio (ref <=5.0)
VLDL: 16 mg/dL (ref <30)

## 2015-08-12 LAB — POCT UA - MICROALBUMIN
ALBUMIN/CREATININE RATIO, URINE, POC: 24.1
Creatinine, POC: 196.5 mg/dL
Microalbumin Ur, POC: 47.4 mg/L

## 2015-08-12 LAB — POCT GLYCOSYLATED HEMOGLOBIN (HGB A1C): HEMOGLOBIN A1C: 6.1

## 2015-08-12 NOTE — Progress Notes (Signed)
Subjective:    Patient ID: Jeremy Reeves, male    DOB: 1940-11-19, 74 y.o.   MRN: [id number]  Jeremy Reeves is a 74 y.o. male who presents for follow-up of Type 2 diabetes mellitus.  Home blood sugar records: Patient checks 2 x a week Current symptoms/problem none Daily foot checks:yes   Any foot concerns: numbness right foot Exercise: walking yard work Eye: 06/09/15 He also complains of difficulty with athlete's foot mainly on the right and is using an antifungal medication. He continues on his HIV medications. He has had no fever, chills, cough, congestion, weight change. His last visit to ID was in January. He stated that since he was doing so well, he didn't think he needed to be seen there. The following portions of the patient's history were reviewed and updated as appropriate: allergies, current medications, past medical history, past social history and problem list.  ROS as in subjective above.     Objective:    Physical Exam Alert and in no distress slight maceration is seen between the fourth and fifth toes on the right.   Lab Review Diabetic Labs Latest Ref Rng 04/16/2015 10/24/2014 10/15/2014 07/31/2014 04/09/2014  HbA1c - 6.1 - 6.2 - -  Chol 0 - 200 mg/dL - - - 114 -  HDL >39 mg/dL - - - 39(L) -  Calc LDL 0 - 99 mg/dL - - - 60 -  Triglycerides <150 mg/dL - - - 75 -  Creatinine 0.50 - 1.35 mg/dL - 1.43(H) - 1.39(H) 1.54(H)   BP/Weight 06/24/2015 04/16/2015 12/30/2014 12/26/2014 08/11/8526  Systolic BP 782 423 536 144 315  Diastolic BP 99 82 80 77 80  Wt. (Lbs) - 189 190 192 189  BMI - 29.59 29.75 30.06 29.59   Foot/eye exam completion dates Latest Ref Rng 06/09/2015  Eye Exam No Retinopathy No Retinopathy  Foot Form Completion - -  Hemoglobin A1c 6.1   Jeremy Reeves  reports that he has quit smoking. His smoking use included Cigarettes. He has a 4.8 pack-year smoking history. He has never used smokeless tobacco. He reports that he does not drink alcohol or use illicit  drugs.     Assessment & Plan:    Type 2 diabetes mellitus without complication, without long-term current use of insulin (HCC) - Plan: POCT glycosylated hemoglobin (Hb A1C), CBC with Differential/Platelet, Comprehensive metabolic panel, Lipid panel, POCT UA - Microalbumin  Postherpetic neuralgia  HIV disease (HCC) - Plan: CBC with Differential/Platelet, Comprehensive metabolic panel, Lipid panel, T-helper cells (CD4) count, HIV 1 RNA quant-no reflex-bld  Hypertension associated with diabetes ([place]) - Plan: CBC with Differential/Platelet, Comprehensive metabolic panel  Hyperlipidemia associated with type 2 diabetes mellitus ([place]) - Plan: Lipid panel  Encounter for long-term (current) use of other high-risk medications - Plan: CBC with Differential/Platelet, Comprehensive metabolic panel, Lipid panel, POCT UA - Microalbumin, T-helper cells (CD4) count, HIV 1 RNA quant-no reflex-bld  Need for prophylactic vaccination and inoculation against influenza - Plan: Flu vaccine HIGH DOSE PF (Fluzone High dose)  Need for prophylactic vaccination against Streptococcus pneumoniae (pneumococcus) - Plan: Pneumococcal conjugate vaccine 13-valent  Tinea pedis of right foot   1. Rx changes: none 2. Education: Reviewed 'ABCs' of diabetes management (respective goals in parentheses):  A1C (<7), blood pressure (<130/80), and cholesterol (LDL <100). 3. Compliance at present is estimated to be good. Efforts to improve compliance (if necessary) will be directed at increased exercise. 4. Follow up: 4 months I explained that he indeed does need to have  periodic checkup on his HIV status and will go ahead and run this. Discussed me taking this over versus him continuing in ID. He will consider that. Encouraged him to remain physically active as overall he seems be doing fairly well. He will continue on the antifungal medicine.

## 2015-08-13 LAB — CBC WITH DIFFERENTIAL/PLATELET
BASOS PCT: 1 % (ref 0–1)
Basophils Absolute: 0.1 10*3/uL (ref 0.0–0.1)
Eosinophils Absolute: 0.2 10*3/uL (ref 0.0–0.7)
Eosinophils Relative: 3 % (ref 0–5)
HCT: 49.6 % (ref 39.0–52.0)
HEMOGLOBIN: 17.8 g/dL — AB (ref 13.0–17.0)
LYMPHS PCT: 26 % (ref 12–46)
Lymphs Abs: 1.5 10*3/uL (ref 0.7–4.0)
MCH: 29.5 pg (ref 26.0–34.0)
MCHC: 35.9 g/dL (ref 30.0–36.0)
MCV: 82.3 fL (ref 78.0–100.0)
MONOS PCT: 10 % (ref 3–12)
MPV: 11.1 fL (ref 8.6–12.4)
Monocytes Absolute: 0.6 10*3/uL (ref 0.1–1.0)
NEUTROS ABS: 3.4 10*3/uL (ref 1.7–7.7)
NEUTROS PCT: 60 % (ref 43–77)
Platelets: 102 10*3/uL — ABNORMAL LOW (ref 150–400)
RBC: 6.03 MIL/uL — AB (ref 4.22–5.81)
RDW: 15.5 % (ref 11.5–15.5)
WBC: 5.6 10*3/uL (ref 4.0–10.5)

## 2015-08-13 LAB — HIV-1 RNA QUANT-NO REFLEX-BLD: HIV-1 RNA Quant, Log: <1.3 Log copies/mL (ref <1.30)

## 2015-08-13 LAB — T-HELPER CELLS (CD4) COUNT (NOT AT ARMC)
ABSOLUTE CD4: 539 {cells}/uL (ref 381–1469)
CD4 T Helper %: 38 % (ref 32–62)
TOTAL LYMPHOCYTE COUNT: 1433 {cells}/uL (ref 700–3300)

## 2015-09-09 ENCOUNTER — Encounter: Payer: Self-pay | Admitting: Cardiovascular Disease

## 2015-09-09 ENCOUNTER — Ambulatory Visit (INDEPENDENT_AMBULATORY_CARE_PROVIDER_SITE_OTHER): Payer: MEDICARE | Admitting: Cardiovascular Disease

## 2015-09-09 VITALS — BP 142/90 | HR 70 | Ht 67.0 in | Wt 191.0 lb

## 2015-09-09 DIAGNOSIS — I2510 Atherosclerotic heart disease of native coronary artery without angina pectoris: Secondary | ICD-10-CM | POA: Diagnosis not present

## 2015-09-09 DIAGNOSIS — E785 Hyperlipidemia, unspecified: Secondary | ICD-10-CM

## 2015-09-09 DIAGNOSIS — E1169 Type 2 diabetes mellitus with other specified complication: Secondary | ICD-10-CM | POA: Diagnosis not present

## 2015-09-09 NOTE — Assessment & Plan Note (Signed)
History of hyperlipidemia on pravastatin. His most recent lipid profile performed 08/12/15 revealed a total cholesterol 140, LDL 77 and HDL 47. When looking back over his prior lipid profiles in 2014 his total cholesterol was 85 and last year was 114. He is also steadily increased. After questioning him about his diet he does admit to dietary indiscretion.

## 2015-09-09 NOTE — Assessment & Plan Note (Signed)
History of hypertension blood pressure measured at 142/90. He does check his blood pressure at home and says this is higher than it usually runs. He is on carvedilol. Continue current meds at current dosing

## 2015-09-09 NOTE — Patient Instructions (Signed)

## 2015-09-09 NOTE — Assessment & Plan Note (Signed)
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 His last Myoview performed 2/13 was nonischemic. He denies chest pain or shortness of breath.

## 2015-09-09 NOTE — Progress Notes (Signed)
09/09/2015 Jeremy Reeves   10/06/1941  [id number]  Primary Physician Wyatt Haste, MD Primary Cardiologist: Lorretta Harp MD Renae Gloss   HPI:  The patient is a 74 year old, moderately overweight, married [place] American male, father of 2, grandfather to 3 grandchildren who I last saw in the office 02/04/14.Marland Kitchen He has a history of ischemic heart disease status post coronary artery bypass grafting in 1999 with a LIMA to his LAD, a vein to an obtuse marginal branch and to the RCA. Myoview performed February 2013 was nonischemic. He denies chest pain or shortness of breath. His most recent lipid profile performed 08/12/15 revealed a total cholesterol 140, LDL 77 HDL 47. His total cholesterol has steadily increased from 2014 when was 85, 114 last year and 140 this year probably related to dietary indiscretion by his own admission.   Current Outpatient Prescriptions  Medication Sig Dispense Refill  . carvedilol (COREG) 3.125 MG tablet TAKE 1 TABLET TWICE DAILY WITH MEALS 180 tablet 0  . Cinnamon 500 MG capsule Take 1,000 mg by mouth daily.    . Multiple Vitamins-Minerals (MULTIVITAMIN WITH MINERALS) tablet Take 1 tablet by mouth daily.      . niacin (NIASPAN) 1000 MG CR tablet Take 2 tablets (2,000 mg total) by mouth at bedtime. 180 tablet 0  . omeprazole (PRILOSEC) 20 MG capsule Take 1 capsule (20 mg total) by mouth daily. 90 capsule 0  . pravastatin (PRAVACHOL) 40 MG tablet TAKE 1 TABLET EVERY DAY 90 tablet 11  . TRIUMEQ 600-50-300 MG TABS TAKE 1 TABLET BY MOUTH EVERY DAY 30 tablet 5  . vitamin E 400 UNIT capsule Take 400 Units by mouth daily.      No current facility-administered medications for this visit.    Allergies  Allergen Reactions  . Peanut-Containing Drug Products Swelling    Social History   Social History  . Marital Status: Married    Spouse Name: N/A  . Number of Children: N/A  . Years of Education: N/A   Occupational History  . Not on  file.   Social History Main Topics  . Smoking status: Former Smoker -- 0.12 packs/day for 40 years    Types: Cigarettes  . Smokeless tobacco: Never Used     Comment: 07/04/2013 "I quit smoking before 2000"  . Alcohol Use: No  . Drug Use: No  . Sexual Activity:    Partners: Female     Comment: declined condoms   Other Topics Concern  . Not on file   Social History Narrative     Review of Systems: General: negative for chills, fever, night sweats or weight changes.  Cardiovascular: negative for chest pain, dyspnea on exertion, edema, orthopnea, palpitations, paroxysmal nocturnal dyspnea or shortness of breath Dermatological: negative for rash Respiratory: negative for cough or wheezing Urologic: negative for hematuria Abdominal: negative for nausea, vomiting, diarrhea, bright red blood per rectum, melena, or hematemesis Neurologic: negative for visual changes, syncope, or dizziness All other systems reviewed and are otherwise negative except as noted above.    Blood pressure 142/90, pulse 70, height 5\' 7"  (1.702 m), weight 191 lb (86.637 kg).  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 He does have inferior Q waves as well. I personally reviewed this EKG  ASSESSMENT AND PLAN:   Hypertension associated with diabetes History of hypertension blood pressure measured at 142/90. He does check his blood pressure at home and says this is higher than it usually runs. He is on carvedilol. Continue current meds at current dosing  Hyperlipidemia  associated with type 2 diabetes mellitus History of hyperlipidemia on pravastatin. His most recent lipid profile performed 08/12/15 revealed a total cholesterol 140, LDL 77 and HDL 47. When looking back over his prior lipid profiles in 2014 his total cholesterol was 85 and last year was 114. He is also steadily increased. After questioning him about his diet he does admit to dietary indiscretion.  ASHD (arteriosclerotic heart disease) History of CAD status post coronary artery bypass grafting in 1999 with a LIMA to his LAD, vein to obtuse marginal branch and to the RCA. His last Myoview performed 2/13 was nonischemic. He denies chest pain or shortness of breath.      Lorretta Harp MD FACP,FACC,FAHA, [facility] 09/09/2015 8:47 AM ]

## 2015-09-15 DIAGNOSIS — H40013 Open angle with borderline findings, low risk, bilateral: Secondary | ICD-10-CM | POA: Diagnosis not present

## 2015-09-19 ENCOUNTER — Other Ambulatory Visit: Payer: Self-pay | Admitting: Cardiovascular Disease

## 2015-10-01 ENCOUNTER — Other Ambulatory Visit: Payer: Self-pay | Admitting: Cardiovascular Disease

## 2015-11-11 ENCOUNTER — Ambulatory Visit (INDEPENDENT_AMBULATORY_CARE_PROVIDER_SITE_OTHER): Payer: MEDICARE | Admitting: Internal Medicine

## 2015-11-11 ENCOUNTER — Encounter: Payer: Self-pay | Admitting: Internal Medicine

## 2015-11-11 VITALS — BP 164/93 | HR 79 | Temp 98.0°F | Wt 184.0 lb

## 2015-11-11 DIAGNOSIS — N182 Chronic kidney disease, stage 2 (mild): Secondary | ICD-10-CM | POA: Diagnosis not present

## 2015-11-11 DIAGNOSIS — I10 Essential (primary) hypertension: Secondary | ICD-10-CM | POA: Diagnosis not present

## 2015-11-11 DIAGNOSIS — B0229 Other postherpetic nervous system involvement: Secondary | ICD-10-CM | POA: Diagnosis not present

## 2015-11-11 DIAGNOSIS — B20 Human immunodeficiency virus [HIV] disease: Secondary | ICD-10-CM | POA: Diagnosis present

## 2015-11-11 MED ORDER — ABACAVIR-DOLUTEGRAVIR-LAMIVUD 600-50-300 MG PO TABS: 1.0000 | ORAL_TABLET | Freq: Every day | ORAL | Status: DC

## 2015-11-11 NOTE — Progress Notes (Signed)
Patient ID: Jeremy Reeves, male   DOB: Jul 08, 1941, 75 y.o.   MRN: [id number]       Patient ID: Jeremy Reeves, male   DOB: 07-17-1941, 75 y.o.   MRN: [id number]  HPI 75yo M with HIV disease, CD 4 count of 340/VL<20 (oct 2016), on triumeq. Needs refill. He is doing well overall. Still has some numbness assoicated with his right foot from post herpetic neuralgia. No recent illnesses  Outpatient Encounter Prescriptions as of 11/11/2015  Medication Sig  . carvedilol (COREG) 3.125 MG tablet TAKE 1 TABLET TWICE DAILY WITH MEALS  . Cinnamon 500 MG capsule Take 1,000 mg by mouth daily.  . Multiple Vitamins-Minerals (MULTIVITAMIN WITH MINERALS) tablet Take 1 tablet by mouth daily.    . niacin (NIASPAN) 1000 MG CR tablet TAKE 2 TABLETS (2,000 MG TOTAL) BY MOUTH AT BEDTIME.  Marland Kitchen omeprazole (PRILOSEC) 20 MG capsule TAKE 1 CAPSULE EVERY DAY (PLEASE KEEP APPOINTMENT ON 09/09/15 FOR FURTHER REFILLS)  . pravastatin (PRAVACHOL) 40 MG tablet TAKE 1 TABLET EVERY DAY  . TRIUMEQ 600-50-300 MG TABS TAKE 1 TABLET BY MOUTH EVERY DAY  . vitamin E 400 UNIT capsule Take 400 Units by mouth daily.    No facility-administered encounter medications on file as of 11/11/2015.     Patient Active Problem List   Diagnosis Date Noted  . Postherpetic neuralgia 04/16/2015  . GERD (gastroesophageal reflux disease) 07/05/2013  . HIV disease ([place]) 05/24/2013  . Hypertension associated with diabetes ([place]) 01/25/2013  . ASHD (arteriosclerotic heart disease) 03/18/2011  . BPH (benign prostatic hyperplasia) 03/18/2011  . Hyperlipidemia associated with type 2 diabetes mellitus ([place]) 03/18/2011  . Diabetes mellitus ([place]) 03/18/2011     Health Maintenance Due  Topic Date Due  . FOOT EXAM  08/17/1952     Review of Systems   Constitutional: Negative for fever, chills, diaphoresis, activity change, appetite change, fatigue and unexpected weight change.  HENT: Negative for congestion, sore throat, rhinorrhea, sneezing,  trouble swallowing and sinus pressure.  Eyes: Negative for photophobia and visual disturbance.  Respiratory: Negative for cough, chest tightness, shortness of breath, wheezing and stridor.  Cardiovascular: Negative for chest pain, palpitations and leg swelling.  Gastrointestinal: Negative for nausea, vomiting, abdominal pain, diarrhea, constipation, blood in stool, abdominal distention and anal bleeding.  Genitourinary: Negative for dysuria, hematuria, flank pain and difficulty urinating.  Musculoskeletal: Negative for myalgias, back pain, joint swelling, arthralgias and gait problem.  Skin: Negative for color change, pallor, rash and wound.  Neurological: occasional right foot numbness from prior zoster infection Hematological: Negative for adenopathy. Does not bruise/bleed easily.  Psychiatric/Behavioral: Negative for behavioral problems, confusion, sleep disturbance, dysphoric mood, decreased concentration and agitation.    Physical Exam   BP 164/93 mmHg  Pulse 79  Temp(Src) 98 F (36.7 C) (Oral)  Wt 184 lb (83.462 kg) Physical Exam  Constitutional: He is oriented to person, place, and time. He appears well-developed and well-nourished. No distress.  HENT:  Mouth/Throat: Oropharynx is clear and moist. No oropharyngeal exudate.  Cardiovascular: Normal rate, regular rhythm and normal heart sounds. Exam reveals no gallop and no friction rub.  No murmur heard.  Pulmonary/Chest: Effort normal and breath sounds normal. No respiratory distress. He has no wheezes.  Abdominal: Soft. Bowel sounds are normal. He exhibits no distension. There is no tenderness.  Lymphadenopathy:  He has no cervical adenopathy.  Neurological: He is alert and oriented to person, place, and time. numbness to dorsum of foot Skin: Skin is warm and dry. No  rash noted. No erythema. Hyperpigmented to right dorsum of foot, scarring from zoster Psychiatric: He has a normal mood and affect. His behavior is normal.   Lab  Results  Component Value Date   CD4TCELL 38 08/12/2015   Lab Results  Component Value Date   CD4TABS 340* 10/24/2014   CD4TABS 480 07/31/2014   CD4TABS 470 04/09/2014   Lab Results  Component Value Date   HIV1RNAQUANT <20 08/12/2015   Lab Results  Component Value Date   HEPBSAB NEG 05/24/2013   No results found for: RPR  CBC Lab Results  Component Value Date   WBC 5.6 08/12/2015   RBC 6.03* 08/12/2015   HGB 17.8* 08/12/2015   HCT 49.6 08/12/2015   PLT 102* 08/12/2015   MCV 82.3 08/12/2015   MCH 29.5 08/12/2015   MCHC 35.9 08/12/2015   RDW 15.5 08/12/2015   LYMPHSABS 1.5 08/12/2015   MONOABS 0.6 08/12/2015   EOSABS 0.2 08/12/2015   BASOSABS 0.1 08/12/2015   BMET Lab Results  Component Value Date   NA 139 08/12/2015   K 3.8 08/12/2015   CL 99 08/12/2015   CO2 29 08/12/2015   GLUCOSE 103* 08/12/2015   BUN 14 08/12/2015   CREATININE 1.25* 08/12/2015   CALCIUM 9.2 08/12/2015   GFRNONAA 49* 10/24/2014   GFRAA 56* 10/24/2014     Assessment and Plan  hiv disease =well controlled. Slight drop in cd 4 count, will continue to monitor. Will give refill and coupon card  ckd = stable, continue with current regimen  Hypertension = not at goal during this visit, recommended follow up with pcp to adjust meds  Post herpetic neuralgia = use prn lyrica   Health maintenance = received flu vaccine in the fall  rtc in 6 months

## 2015-11-13 ENCOUNTER — Other Ambulatory Visit: Payer: Self-pay | Admitting: *Deleted

## 2015-11-13 DIAGNOSIS — B20 Human immunodeficiency virus [HIV] disease: Secondary | ICD-10-CM

## 2015-11-13 MED ORDER — ABACAVIR-DOLUTEGRAVIR-LAMIVUD 600-50-300 MG PO TABS: 1.0000 | ORAL_TABLET | Freq: Every day | ORAL | Status: DC

## 2015-11-14 ENCOUNTER — Other Ambulatory Visit: Payer: Self-pay | Admitting: Internal Medicine

## 2015-11-25 ENCOUNTER — Other Ambulatory Visit: Payer: Self-pay | Admitting: *Deleted

## 2015-11-25 MED ORDER — PRAVASTATIN SODIUM 40 MG PO TABS: 40.0000 mg | ORAL_TABLET | Freq: Every day | ORAL | Status: DC

## 2015-12-04 ENCOUNTER — Encounter (HOSPITAL_BASED_OUTPATIENT_CLINIC_OR_DEPARTMENT_OTHER): Payer: Self-pay | Admitting: Anesthesiology

## 2015-12-04 ENCOUNTER — Encounter: Payer: Self-pay | Admitting: Family Medicine

## 2015-12-04 ENCOUNTER — Encounter (HOSPITAL_BASED_OUTPATIENT_CLINIC_OR_DEPARTMENT_OTHER)
Admission: RE | Disposition: A | Discharge status: Home / Self Care | Payer: Self-pay | Source: Ambulatory Visit | Attending: Orthopedic Surgery

## 2015-12-04 ENCOUNTER — Ambulatory Visit (HOSPITAL_BASED_OUTPATIENT_CLINIC_OR_DEPARTMENT_OTHER)
Admission: RE | Admit: 2015-12-04 | Discharge: 2015-12-04 | Disposition: A | Discharge status: Home / Self Care | Payer: MEDICARE | Source: Ambulatory Visit | Attending: Orthopedic Surgery | Admitting: Orthopedic Surgery

## 2015-12-04 ENCOUNTER — Ambulatory Visit (INDEPENDENT_AMBULATORY_CARE_PROVIDER_SITE_OTHER): Payer: MEDICARE | Admitting: Family Medicine

## 2015-12-04 ENCOUNTER — Encounter (HOSPITAL_BASED_OUTPATIENT_CLINIC_OR_DEPARTMENT_OTHER): Payer: Self-pay | Admitting: *Deleted

## 2015-12-04 VITALS — BP 122/78 | HR 68 | Wt 191.8 lb

## 2015-12-04 DIAGNOSIS — I10 Essential (primary) hypertension: Secondary | ICD-10-CM | POA: Diagnosis not present

## 2015-12-04 DIAGNOSIS — X58XXXA Exposure to other specified factors, initial encounter: Secondary | ICD-10-CM | POA: Insufficient documentation

## 2015-12-04 DIAGNOSIS — Z1833 Retained wood fragments: Secondary | ICD-10-CM | POA: Insufficient documentation

## 2015-12-04 DIAGNOSIS — K219 Gastro-esophageal reflux disease without esophagitis: Secondary | ICD-10-CM | POA: Insufficient documentation

## 2015-12-04 DIAGNOSIS — B20 Human immunodeficiency virus [HIV] disease: Secondary | ICD-10-CM | POA: Diagnosis not present

## 2015-12-04 DIAGNOSIS — Z23 Encounter for immunization: Secondary | ICD-10-CM

## 2015-12-04 DIAGNOSIS — E119 Type 2 diabetes mellitus without complications: Secondary | ICD-10-CM | POA: Insufficient documentation

## 2015-12-04 DIAGNOSIS — Z951 Presence of aortocoronary bypass graft: Secondary | ICD-10-CM | POA: Diagnosis not present

## 2015-12-04 DIAGNOSIS — L089 Local infection of the skin and subcutaneous tissue, unspecified: Secondary | ICD-10-CM | POA: Insufficient documentation

## 2015-12-04 DIAGNOSIS — S60551A Superficial foreign body of right hand, initial encounter: Secondary | ICD-10-CM

## 2015-12-04 DIAGNOSIS — N400 Enlarged prostate without lower urinary tract symptoms: Secondary | ICD-10-CM | POA: Insufficient documentation

## 2015-12-04 DIAGNOSIS — Z87891 Personal history of nicotine dependence: Secondary | ICD-10-CM | POA: Insufficient documentation

## 2015-12-04 DIAGNOSIS — I2510 Atherosclerotic heart disease of native coronary artery without angina pectoris: Secondary | ICD-10-CM | POA: Insufficient documentation

## 2015-12-04 DIAGNOSIS — E785 Hyperlipidemia, unspecified: Secondary | ICD-10-CM | POA: Insufficient documentation

## 2015-12-04 DIAGNOSIS — S60452A Superficial foreign body of right middle finger, initial encounter: Secondary | ICD-10-CM | POA: Insufficient documentation

## 2015-12-04 HISTORY — PX: IRRIGATION AND DEBRIDEMENT ABSCESS: SHX5252

## 2015-12-04 SURGERY — MINOR INCISION AND DRAINAGE OF ABSCESS
Anesthesia: LOCAL | Site: Finger | Laterality: Right

## 2015-12-04 MED ORDER — HYDROCODONE-ACETAMINOPHEN 5-325 MG PO TABS: ORAL_TABLET | ORAL | Status: DC

## 2015-12-04 MED ORDER — LIDOCAINE HCL (PF) 1 % IJ SOLN
INTRAMUSCULAR | Status: DC | PRN
  Administered 2015-12-04: 6.5 mL

## 2015-12-04 MED ORDER — 0.9 % SODIUM CHLORIDE (POUR BTL) OPTIME
TOPICAL | Status: DC | PRN
  Administered 2015-12-04: 200 mL

## 2015-12-04 MED ORDER — BUPIVACAINE HCL (PF) 0.25 % IJ SOLN
INTRAMUSCULAR | Status: DC | PRN
  Administered 2015-12-04: 6.5 mL

## 2015-12-04 MED ORDER — LIDOCAINE HCL (PF) 1 % IJ SOLN
INTRAMUSCULAR | Status: AC
  Filled 2015-12-04: qty 30

## 2015-12-04 MED ORDER — SULFAMETHOXAZOLE-TRIMETHOPRIM 800-160 MG PO TABS: 1.0000 | ORAL_TABLET | Freq: Two times a day (BID) | ORAL | Status: DC

## 2015-12-04 SURGICAL SUPPLY — 43 items
BANDAGE COBAN STERILE 2 (GAUZE/BANDAGES/DRESSINGS) IMPLANT
BLADE SURG 15 STRL LF DISP TIS (BLADE) ×2 IMPLANT
BLADE SURG 15 STRL SS (BLADE) ×2
BNDG COHESIVE 1X5 TAN STRL LF (GAUZE/BANDAGES/DRESSINGS) ×2 IMPLANT
BNDG CONFORM 2 STRL LF (GAUZE/BANDAGES/DRESSINGS) IMPLANT
BNDG ELASTIC 2X5.8 VLCR STR LF (GAUZE/BANDAGES/DRESSINGS) IMPLANT
BNDG ESMARK 4X9 LF (GAUZE/BANDAGES/DRESSINGS) IMPLANT
CHLORAPREP W/TINT 26ML (MISCELLANEOUS) ×2 IMPLANT
CORDS BIPOLAR (ELECTRODE) IMPLANT
COVER BACK TABLE 60X90IN (DRAPES) IMPLANT
COVER MAYO STAND STRL (DRAPES) ×2 IMPLANT
CUFF TOURNIQUET SINGLE 18IN (TOURNIQUET CUFF) IMPLANT
DRAIN PENROSE 1/2X12 LTX STRL (WOUND CARE) IMPLANT
DRAIN PENROSE 1/4X12 LTX STRL (WOUND CARE) ×2 IMPLANT
DRAPE EXTREMITY T 121X128X90 (DRAPE) ×2 IMPLANT
DRAPE SURG 17X23 STRL (DRAPES) IMPLANT
GAUZE PACKING IODOFORM 1/4X15 (GAUZE/BANDAGES/DRESSINGS) IMPLANT
GAUZE SPONGE 4X4 12PLY STRL (GAUZE/BANDAGES/DRESSINGS) ×2 IMPLANT
GAUZE XEROFORM 1X8 LF (GAUZE/BANDAGES/DRESSINGS) ×2 IMPLANT
GLOVE BIO SURGEON STRL SZ 6.5 (GLOVE) ×2 IMPLANT
GLOVE BIO SURGEON STRL SZ7.5 (GLOVE) ×2 IMPLANT
GLOVE BIOGEL PI IND STRL 7.0 (GLOVE) ×1 IMPLANT
GLOVE BIOGEL PI IND STRL 8 (GLOVE) ×1 IMPLANT
GLOVE BIOGEL PI INDICATOR 7.0 (GLOVE) ×1
GLOVE BIOGEL PI INDICATOR 8 (GLOVE) ×1
GLOVE EXAM NITRILE MD LF STRL (GLOVE) ×2 IMPLANT
GOWN STRL REUS W/ TWL LRG LVL3 (GOWN DISPOSABLE) ×1 IMPLANT
GOWN STRL REUS W/TWL LRG LVL3 (GOWN DISPOSABLE) ×1
GOWN STRL REUS W/TWL XL LVL3 (GOWN DISPOSABLE) ×2 IMPLANT
LOOP VESSEL MAXI BLUE (MISCELLANEOUS) ×2 IMPLANT
NEEDLE HYPO 25X1 1.5 SAFETY (NEEDLE) ×4 IMPLANT
NS IRRIG 1000ML POUR BTL (IV SOLUTION) ×2 IMPLANT
PACK BASIN DAY SURGERY FS (CUSTOM PROCEDURE TRAY) IMPLANT
PADDING CAST ABS 4INX4YD NS (CAST SUPPLIES)
PADDING CAST ABS COTTON 4X4 ST (CAST SUPPLIES) IMPLANT
STOCKINETTE 4X48 STRL (DRAPES) ×2 IMPLANT
SUT ETHILON 4 0 PS 2 18 (SUTURE) ×2 IMPLANT
SWAB CULTURE ESWAB REG 1ML (MISCELLANEOUS) IMPLANT
SWAB CULTURE LIQ STUART DBL (MISCELLANEOUS) IMPLANT
SYR BULB 3OZ (MISCELLANEOUS) ×2 IMPLANT
SYR CONTROL 10ML LL (SYRINGE) ×4 IMPLANT
TOWEL OR 17X24 6PK STRL BLUE (TOWEL DISPOSABLE) ×2 IMPLANT
UNDERPAD 30X30 (UNDERPADS AND DIAPERS) ×2 IMPLANT

## 2015-12-04 NOTE — Patient Instructions (Signed)
Nothing to eat or drink, go straight to the day surgery center and Dr. Fredna Dow is expecting you. I spoke with Sula Soda, the nurse at [facility].

## 2015-12-04 NOTE — Op Note (Signed)
[id number] 

## 2015-12-04 NOTE — Discharge Instructions (Signed)

## 2015-12-04 NOTE — Brief Op Note (Signed)
12/04/2015  4:01 PM  PATIENT:  Jeremy Reeves  75 y.o. male  PRE-OPERATIVE DIAGNOSIS:  Infected Right middle finger with foreign body  POST-OPERATIVE DIAGNOSIS:  Right middle finger foreign body  PROCEDURE:  Procedure(s): MINOR INCISION AND DRAINAGE OF ABSCESS (Right)  SURGEON:  Surgeon(s) and Role:    * Leanora Cover, MD - Primary  PHYSICIAN ASSISTANT:   ASSISTANTS: none   ANESTHESIA:   local  EBL:     BLOOD ADMINISTERED:none  DRAINS: vessel loop drain  LOCAL MEDICATIONS USED:  MARCAINE    and LIDOCAINE   SPECIMEN:  No Specimen  DISPOSITION OF SPECIMEN:  N/A  COUNTS:  YES  TOURNIQUET:    DICTATION: .Other Dictation: Dictation Number [id number]  PLAN OF CARE: Discharge to home after PACU  PATIENT DISPOSITION:  PACU - hemodynamically stable.

## 2015-12-04 NOTE — Anesthesia Preprocedure Evaluation (Deleted)
Anesthesia Evaluation  Patient identified by MRN, date of birth, ID band Patient awake    Reviewed: Allergy & Precautions, H&P , NPO status , Patient's Chart, lab work & pertinent test results  History of Anesthesia Complications (+) DIFFICULT AIRWAY and history of anesthetic complications  Airway Mallampati: II  TM Distance: >3 FB Neck ROM: Full    Dental no notable dental hx. (+) Partial Upper   Pulmonary neg pulmonary ROS, former smoker,    Pulmonary exam normal breath sounds clear to auscultation       Cardiovascular hypertension, Pt. on medications + CAD and + CABG  negative cardio ROS Normal cardiovascular exam Rhythm:Regular Rate:Normal     Neuro/Psych negative neurological ROS  negative psych ROS   GI/Hepatic negative GI ROS, Neg liver ROS, GERD  Medicated and Controlled,  Endo/Other  negative endocrine ROSdiabetes  Renal/GU negative Renal ROS  negative genitourinary   Musculoskeletal negative musculoskeletal ROS (+)   Abdominal   Peds negative pediatric ROS (+)  Hematology negative hematology ROS (+) HIV,   Anesthesia Other Findings   Reproductive/Obstetrics negative OB ROS                             Anesthesia Physical  Anesthesia Plan  ASA: III  Anesthesia Plan:    Post-op Pain Management:    Induction:   Airway Management Planned:   Additional Equipment:   Intra-op Plan:   Post-operative Plan:   Informed Consent:   Plan Discussed with: CRNA  Anesthesia Plan Comments: (Straight local by surgeon. No anesthesia involvement. Chart reviewed only)       Anesthesia Quick Evaluation

## 2015-12-04 NOTE — Op Note (Addendum)
NAME:  Jeremy Reeves, Jeremy Reeves NO.:  [id number]  MEDICAL RECORD NO.:  [id number]  LOCATION:                                 FACILITY:  PHYSICIAN:  Leanora Cover, MD        DATE OF BIRTH:  07-Apr-1941  DATE OF PROCEDURE:  12/04/2015 DATE OF DISCHARGE:                              OPERATIVE REPORT   PREOPERATIVE DIAGNOSIS:  Right long finger foreign body with infection.  POSTOPERATIVE DIAGNOSIS:  Right long finger foreign body.  PROCEDURE: Right long finger removal deep foreign body.  SURGEON:  Leanora Cover, MD.  ASSISTANT:  None.  ANESTHESIA:  Digital block with 15 mL of half and half solution of 1% plain lidocaine and 0.25% plain Marcaine.  TOURNIQUET TIME:  Penrose drain approximately 10 minutes.  DISPOSITION:  Stable to PACU.  SPECIMENS:  None.  INDICATIONS:  Jeremy Reeves is a 75 year old male, who states that approximately 5 days ago, he got a splinter in his right long finger. He tried to remove it, but was unsuccessful.  He was seen by his primary care physician today and referred to me for further care.  He reports no previous injuries to the finger rather at this time.  No fevers, chills, or night sweats.  He has pain in the finger.  On examination, there was no erythema.  There were 2 small wounds, one at dorsal radial aspect of the right long finger over the middle phalanx and one at the volar aspect.  There was no erythema or proximal streaking.  It is mildly tender to palpation.  I recommended operative incision and drainage with removal of foreign body.  Risks, benefits, and alternatives of surgery were discussed including risk of blood loss; infection; damage to nerves, vessels, tendons, ligaments, bone; failure of surgery; need for additional surgery; complications with wound healing; continued pain; retained foreign body; continued infection.  He voiced understanding of these risks and elected to proceed.  OPERATIVE COURSE:  After being  identified preoperatively by myself, the patient and I agreed upon procedure and site of procedure.  Surgical site was marked.  The risks, benefits, and alternatives of surgery were reviewed and he wished to proceed.  Surgical consent had been signed. He was transferred to the operating room and placed on the operating table in supine position with the right upper extremity on arm board. Surgical pause was performed between surgeons, operating staff, and patient; all were in agreement with the patient, procedure, and site of procedure.  A digital block was performed to the right long finger with 10 mL of half and half solution of 1% plain lidocaine and 0.25% plain Marcaine.  This was later augmented with an additional 5 mL.  The right hand and forearm were prepped and draped in normal sterile orthopedic fashion.  Surgical pause was again performed between surgeons, operating staff, and patient; all were in agreement with the patient, procedure, and site of procedure.  An incision was made at the dorsum of the finger over the wound.  This was carried into subcutaneous tissues by spreading technique.  There was no gross purulence.  Woody foreign body was removed.  This was approximately 1 cm  in length.  There was an additional small fragment of wood which was also removed.  Incision was made over the volar wound.  The tract of the other foreign body was found to communicate with this.  There was no additional foreign body noted.  It appeared that the foreign body entered from dorsally and nearly came out volarly.  The wounds were copiously irrigated with sterile saline.  Two 4-0 nylon sutures were placed to decrease the wound size.  A vessel loop drain was placed along the foreign body tract.  The wounds were dressed with sterile 4x4s and wrapped with a Coban dressing lightly.  Penrose drain was removed.  The patient tolerated the procedure well.  I will see him back in the office in 3 or 4  days for postprocedure followup.  I will give him Norco 5/325, 1-2 p.o. q.6 hours p.r.n. pain, dispensed #30 and Bactrim DS 1 p.o. b.i.d. x7 days.     Leanora Cover, MD     KK/MEDQ  D:  12/04/2015  T:  12/04/2015  Job:  [id number]  Addendum (12/08/15): edited to add procedure to brief note section.

## 2015-12-04 NOTE — H&P (Signed)
Jeremy Reeves is an 75 y.o. Reeves.   Chief Complaint: right long finger foreign bodies and infection HPI: Jeremy Reeves present with wife states he got two splinters in right long finger 5 days ago.  Tried to get them out.  Seen by PCP today and pus drained from the posterior wound.  No fevers, chills, night sweats.  The finger feels better since being drained, but is still sore.  No other wounds at this time.  Allergies:  Allergies  Allergen Reactions  . Peanut-Containing Drug Products Swelling    Past Medical History  Diagnosis Date  . ASHD (arteriosclerotic heart disease)   . Hyperlipidemia   . BPH (benign prostatic hyperplasia)   . Hiatal hernia   . HH (hiatus hernia)   . HIV (human immunodeficiency virus infection) ([place]) dx'd 2014  . GERD (gastroesophageal reflux disease)   . Hypertension   . Type II diabetes mellitus (Jeremy Reeves)     pre diabetes  . Shingles 8/14  . Foley catheter in place 10/23/12  . Difficult intubation     anesthesia record 1999 / record on chart    Past Surgical History  Procedure Laterality Date  . Colonoscopy  2004  . Transurethral resection of prostate N/A 10/25/2013    Procedure: TRANSURETHRAL RESECTION OF THE PROSTATE WITH GYRUS BUTTON;  Surgeon: Irine Seal, MD;  Location: [facility];  Service: Urology;  Laterality: N/A;  . Cardiac catheterization  05/02/1998    Recommend CABG  . 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 LIMA to the LAD, vein to obtuse marginal, and vein to RCA  . Cardiovascular stress test  12/01/2011    Perfusion defect consistent with diaphragmatic attenuation. There is no scintigraphic of inducible myocardial ischemia.   . Transthoracic echocardiogram  03/29/2011    EF ~50%, mild-moderate inferior wall hypokinesis    Family History: Family History  Problem Relation Age of Onset  . Kidney disease Mother   . Heart disease Father   . Hypertension Brother     Social History:   reports that he has quit smoking. His smoking  use included Cigarettes. He has a 4.8 pack-year smoking history. He has never used smokeless tobacco. He reports that he does not drink alcohol or use illicit drugs.  Medications: No prescriptions prior to admission    No results found for this or any previous visit (from the past 48 hour(s)).  No results found.   A comprehensive review of systems was negative.  There were no vitals taken for this visit.  General appearance: alert, cooperative and appears stated age Head: Normocephalic, without obvious abnormality, atraumatic Neck: supple, symmetrical, trachea midline Resp: clear to auscultation bilaterally Cardio: regular rate and rhythm GI: non-tender Extremities: intact sensation and capillary refill all digits.  +epl/fpl/io.  two small wounds on right long finger, one volarly over middle phalanx and one on dorsoradial side of distal phalanx.  small amount of fluctuance.  no erythema or proximal streaking.  non tender over distal and proximal phalanges. Pulses: 2+ and symmetric Skin: Skin color, texture, turgor normal. No rashes or lesions Neurologic: Grossly normal Incision/Wound: As above  Assessment/Plan Right long finger foreign bodies and infection.  Non operative and operative treatment options were discussed with the patient and patient wishes to proceed with operative treatment. Recommend OR for removal foreign bodies and incision and drainage of wounds.  Risks, benefits, and alternatives of surgery were discussed and the patient agrees with the plan of care.  Jeremy Reeves R 12/04/2015, 2:58 PM

## 2015-12-04 NOTE — Addendum Note (Signed)
Addended by: Minette Headland A on: 12/04/2015 03:56 PM   Modules accepted: Orders

## 2015-12-04 NOTE — Progress Notes (Signed)
   Subjective:    Patient ID: Jeremy Reeves, male    DOB: 04/01/1941, 75 y.o.   MRN: [id number]  HPI Chief Complaint  Patient presents with  . splinter    splinter in finger.    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 States he was able to remove part of the splinter but he thinks there is still a piece inside his finger. Reports eating pus and getting some of his finger. He denies fever, chills, nausea, vomiting.   Last Tdap 2009  Review of Systems  Pertinent positives and negatives in the history of present illness.      Objective:   Physical Exam BP 122/78 mmHg  Pulse 68  Wt 191 lb 12.8 oz (87 kg)  2nd finger on right hand with edema, redness, warmth and tenderness from DIP to PIP joints. Puncture noted to palmar aspect of mid 2nd finger and dorsal aspect. Hand exam is normal and without erythema, edema, tenderness      Assessment & Plan:  Foreign body in hand, right, initial encounter  Finger infection  Need for TD vaccine  Dr. Redmond School also examined patient. Discussed with patient and wife that his finger is infected and that these types of infections do have the potential to spread to the hand quickly. Possible splinter in the finger also has the potential to worsen his situation. Called and spoke with Jeani Hawking, nurse for [facility] orthopedics and hand specialist. She contacted the on-call doctor who is Dr. Fredna Dow and advised that the patient go to the a surgery Center to be further examined by him. Patient's tetanus was updated today. Nothing by mouth since 11 AM today. Patient and wife given instructions and directions to the day surgery Center Spent a minimum of 25 minutes with patient and 50% of that was in counseling and coordination of care.

## 2015-12-05 ENCOUNTER — Encounter (HOSPITAL_BASED_OUTPATIENT_CLINIC_OR_DEPARTMENT_OTHER): Payer: Self-pay | Admitting: Orthopedic Surgery

## 2015-12-16 ENCOUNTER — Other Ambulatory Visit: Payer: Self-pay | Admitting: Cardiovascular Disease

## 2015-12-16 NOTE — Telephone Encounter (Signed)
Rx(s) sent to pharmacy electronically.  

## 2015-12-23 ENCOUNTER — Ambulatory Visit: Payer: MEDICARE | Admitting: Family Medicine

## 2016-01-28 ENCOUNTER — Ambulatory Visit (INDEPENDENT_AMBULATORY_CARE_PROVIDER_SITE_OTHER): Payer: MEDICARE | Admitting: Family Medicine

## 2016-01-28 ENCOUNTER — Encounter: Payer: Self-pay | Admitting: Family Medicine

## 2016-01-28 VITALS — BP 130/88 | HR 78 | Ht 67.0 in | Wt 191.6 lb

## 2016-01-28 DIAGNOSIS — B20 Human immunodeficiency virus [HIV] disease: Secondary | ICD-10-CM

## 2016-01-28 DIAGNOSIS — E1169 Type 2 diabetes mellitus with other specified complication: Secondary | ICD-10-CM | POA: Diagnosis not present

## 2016-01-28 DIAGNOSIS — E1159 Type 2 diabetes mellitus with other circulatory complications: Secondary | ICD-10-CM | POA: Diagnosis not present

## 2016-01-28 DIAGNOSIS — I2510 Atherosclerotic heart disease of native coronary artery without angina pectoris: Secondary | ICD-10-CM | POA: Diagnosis not present

## 2016-01-28 DIAGNOSIS — E119 Type 2 diabetes mellitus without complications: Secondary | ICD-10-CM | POA: Diagnosis not present

## 2016-01-28 DIAGNOSIS — E785 Hyperlipidemia, unspecified: Secondary | ICD-10-CM | POA: Diagnosis not present

## 2016-01-28 DIAGNOSIS — B0229 Other postherpetic nervous system involvement: Secondary | ICD-10-CM | POA: Diagnosis not present

## 2016-01-28 DIAGNOSIS — I10 Essential (primary) hypertension: Secondary | ICD-10-CM | POA: Diagnosis not present

## 2016-01-28 DIAGNOSIS — I152 Hypertension secondary to endocrine disorders: Secondary | ICD-10-CM

## 2016-01-28 LAB — POCT GLYCOSYLATED HEMOGLOBIN (HGB A1C): HEMOGLOBIN A1C: 6.7

## 2016-01-28 NOTE — Progress Notes (Signed)
  Subjective:    Patient ID: Jeremy Reeves, male    DOB: 09-13-1941, 75 y.o.   MRN: [id number]  Jeremy Reeves is a 75 y.o. male who presents for follow-up of Type 2 diabetes mellitus.  Patient is checking home blood sugars.   Home blood sugar records: 130 high 90 low How often is blood sugars being checked: everyother day Current symptoms/problems include none Daily foot checks: yes   Any foot concerns: ingrown toenail on right foot numbness on and off right foot Last eye exam: 06/09/15 Exercise: yard work still working walking mall He has seen Dr. Alvester Chou for follow-up on his underlying ASHD. He continues to be followed for his HIV. Most recent blood work in October didn't look good. He still does have some PHN symptoms of decreased sensation on the medial aspect of his right foot. The following portions of the patient's history were reviewed and updated as appropriate: allergies, current medications, past medical history, past social history and problem list.  ROS as in subjective above.     Objective:    Physical Exam Alert and in no distress otherwise not examined.   Lab Review Diabetic Labs Latest Ref Rng 08/12/2015 04/16/2015 10/24/2014 10/15/2014 07/31/2014  HbA1c - 6.1 6.1 - 6.2 -  Chol 125 - 200 mg/dL 140 - - - 114  HDL >=40 mg/dL 47 - - - 39(L)  Calc LDL <130 mg/dL 77 - - - 60  Triglycerides <150 mg/dL 80 - - - 75  Creatinine 0.70 - 1.18 mg/dL 1.25(H) - 1.43(H) - 1.39(H)   BP/Weight 12/04/2015 12/04/2015 11/11/2015 09/09/2015 [id number]  Systolic BP [id number] 123456 [id number]  Diastolic BP 93 78 93 90 70  Wt. (Lbs) - 191.8 184 191 191  BMI - 30.03 28.81 29.91 30.84   Foot/eye exam completion dates Latest Ref Rng 06/09/2015  Eye Exam No Retinopathy No Retinopathy  Foot Form Completion - -   A1C is 6.7 Jaelen  reports that he has quit smoking. His smoking use included Cigarettes. He has a 4.8 pack-year smoking history. He has never used smokeless tobacco. He reports that he  does not drink alcohol or use illicit drugs.     Assessment & Plan:    Hypertension associated with diabetes ([place])  Hyperlipidemia associated with type 2 diabetes mellitus ([place])  Type 2 diabetes mellitus without complication, without long-term current use of insulin ([place])  ASHD (arteriosclerotic heart disease)  HIV disease ([facility])  Postherpetic neuralgia   1. Rx changes: none 2. Education: Reviewed 'ABCs' of diabetes management (respective goals in parentheses):  A1C (<7), blood pressure (<130/80), and cholesterol (LDL <100). 3. Compliance at present is estimated to be fair. Efforts to improve compliance (if necessary) will be directed at increased exercise. 4. Follow up: 4 months  5. He start checking his blood sugars 2 hours after lunch and dinner as he is checking it sometimes before breakfast. Encouraged him to remain more physically active. Briefly discussed dietary modification. Discussed the fact that he might eventually need to be placed on diabetes medication and readjust his blood pressure medication.

## 2016-01-28 NOTE — Patient Instructions (Signed)
Check your blood sugars 2 hours after breakfast, lunch and dinner. If your blood sugars get above 180 after you eat, then me know

## 2016-02-11 ENCOUNTER — Encounter: Payer: Self-pay | Admitting: Family Medicine

## 2016-02-11 ENCOUNTER — Ambulatory Visit (INDEPENDENT_AMBULATORY_CARE_PROVIDER_SITE_OTHER): Payer: MEDICARE | Admitting: Family Medicine

## 2016-02-11 VITALS — BP 122/80 | HR 67 | Temp 97.9°F | Wt 192.0 lb

## 2016-02-11 DIAGNOSIS — J209 Acute bronchitis, unspecified: Secondary | ICD-10-CM

## 2016-02-11 DIAGNOSIS — E119 Type 2 diabetes mellitus without complications: Secondary | ICD-10-CM | POA: Diagnosis not present

## 2016-02-11 DIAGNOSIS — B20 Human immunodeficiency virus [HIV] disease: Secondary | ICD-10-CM

## 2016-02-11 DIAGNOSIS — I2510 Atherosclerotic heart disease of native coronary artery without angina pectoris: Secondary | ICD-10-CM | POA: Diagnosis not present

## 2016-02-11 MED ORDER — AMOXICILLIN 875 MG PO TABS: 875.0000 mg | ORAL_TABLET | Freq: Two times a day (BID) | ORAL | Status: DC

## 2016-02-11 NOTE — Progress Notes (Signed)
   Subjective:    Patient ID: Jeremy Reeves, male    DOB: October 23, 1940, 75 y.o.   MRN: [id number]  HPI He complains of a one-week history started with rhinorrhea following 3 days later by a cough that became productive, chest congestion and sore throat. The coughing has gotten worse. He has had no fever, chills, earache or shortness of breath. His blood sugars have been holding their own. He continues on his HIV medications and is having no difficulty with him. He does not smoke.   Review of Systems     Objective:   Physical Exam Alert and in no distress. Tympanic membranes and canals are normal. Pharyngeal area is normal. Neck is supple without adenopathy or thyromegaly. Cardiac exam shows a regular sinus rhythm without murmurs or gallops. Lungs are clear to auscultation.        Assessment & Plan:  Type 2 diabetes mellitus without complication, without long-term current use of insulin ([facility])  HIV disease ([facility])  Acute bronchitis, unspecified organism - Plan: amoxicillin (AMOXIL) 875 MG tablet He is to call when he finishes the antibiotic if not totally back to normal.

## 2016-02-11 NOTE — Patient Instructions (Signed)
Take all the antibiotic and if not totally back to normal when you finish give me a call 

## 2016-02-24 ENCOUNTER — Other Ambulatory Visit: Payer: Self-pay | Admitting: Cardiovascular Disease

## 2016-02-24 NOTE — Telephone Encounter (Signed)
Rx request sent to pharmacy.  

## 2016-04-12 ENCOUNTER — Other Ambulatory Visit: Payer: Self-pay | Admitting: Internal Medicine

## 2016-05-11 ENCOUNTER — Encounter: Payer: Self-pay | Admitting: Internal Medicine

## 2016-05-11 ENCOUNTER — Ambulatory Visit (INDEPENDENT_AMBULATORY_CARE_PROVIDER_SITE_OTHER): Payer: MEDICARE | Admitting: Internal Medicine

## 2016-05-11 VITALS — BP 194/120 | HR 75 | Temp 98.0°F | Wt 186.5 lb

## 2016-05-11 DIAGNOSIS — I2510 Atherosclerotic heart disease of native coronary artery without angina pectoris: Secondary | ICD-10-CM | POA: Diagnosis not present

## 2016-05-11 DIAGNOSIS — B20 Human immunodeficiency virus [HIV] disease: Secondary | ICD-10-CM

## 2016-05-11 DIAGNOSIS — I10 Essential (primary) hypertension: Secondary | ICD-10-CM | POA: Diagnosis not present

## 2016-05-11 DIAGNOSIS — N182 Chronic kidney disease, stage 2 (mild): Secondary | ICD-10-CM

## 2016-05-11 LAB — COMPLETE METABOLIC PANEL WITHOUT GFR
ALT: 32 U/L (ref 9–46)
AST: 31 U/L (ref 10–35)
Albumin: 4.3 g/dL (ref 3.6–5.1)
Alkaline Phosphatase: 52 U/L (ref 40–115)
BUN: 12 mg/dL (ref 7–25)
CO2: 28 mmol/L (ref 20–31)
Calcium: 9.4 mg/dL (ref 8.6–10.3)
Chloride: 105 mmol/L (ref 98–110)
Creat: 1.42 mg/dL — ABNORMAL HIGH (ref 0.70–1.18)
GFR, Est African American: 56 mL/min — ABNORMAL LOW
GFR, Est Non African American: 49 mL/min — ABNORMAL LOW
Glucose, Bld: 114 mg/dL — ABNORMAL HIGH (ref 65–99)
Potassium: 4 mmol/L (ref 3.5–5.3)
Sodium: 141 mmol/L (ref 135–146)
Total Bilirubin: 0.6 mg/dL (ref 0.2–1.2)
Total Protein: 7.5 g/dL (ref 6.1–8.1)

## 2016-05-11 LAB — CBC WITH DIFFERENTIAL/PLATELET
Basophils Absolute: 0 {cells}/uL (ref 0–200)
Basophils Relative: 0 %
Eosinophils Absolute: 177 {cells}/uL (ref 15–500)
Eosinophils Relative: 3 %
HCT: 51.1 % — ABNORMAL HIGH (ref 38.5–50.0)
Hemoglobin: 17.5 g/dL — ABNORMAL HIGH (ref 13.2–17.1)
Lymphocytes Relative: 33 %
Lymphs Abs: 1947 {cells}/uL (ref 850–3900)
MCH: 28.5 pg (ref 27.0–33.0)
MCHC: 34.2 g/dL (ref 32.0–36.0)
MCV: 83.2 fL (ref 80.0–100.0)
Monocytes Absolute: 531 {cells}/uL (ref 200–950)
Monocytes Relative: 9 %
Neutro Abs: 3245 {cells}/uL (ref 1500–7800)
Neutrophils Relative %: 55 %
Platelets: 106 K/uL — ABNORMAL LOW (ref 140–400)
RBC: 6.14 MIL/uL — ABNORMAL HIGH (ref 4.20–5.80)
RDW: 15.3 % — ABNORMAL HIGH (ref 11.0–15.0)
WBC: 5.9 K/uL (ref 3.8–10.8)

## 2016-05-11 NOTE — Progress Notes (Signed)
Patient ID: Jeremy Reeves, male   DOB: [birth year], 75 y.o.   MRN: [id number]       Patient ID: Jeremy Reeves, male   DOB: Jun 17, 1941, 75 y.o.   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 He is doing well with medications. No complaints. He denies any fever, chills, nightsweats, rash, n/v/d.  Outpatient Encounter Prescriptions as of 05/11/2016  Medication Sig  . abacavir-dolutegravir-lamiVUDine (TRIUMEQ) 600-50-300 MG tablet Take 1 tablet by mouth daily.  . carvedilol (COREG) 3.125 MG tablet TAKE 1 TABLET TWICE DAILY WITH MEALS  . Cinnamon 500 MG capsule Take 1,000 mg by mouth daily.  . Cranberry 360 MG CAPS Take by mouth.  . Multiple Vitamins-Minerals (MULTIVITAMIN WITH MINERALS) tablet Take 1 tablet by mouth daily.    . niacin (NIASPAN) 1000 MG CR tablet TAKE 2 TABLETS (2,000 MG TOTAL) BY MOUTH AT BEDTIME.  Marland Kitchen omeprazole (PRILOSEC) 20 MG capsule TAKE 1 CAPSULE EVERY DAY  . pravastatin (PRAVACHOL) 40 MG tablet TAKE 1 TABLET (40 MG TOTAL) BY MOUTH DAILY.  . vitamin E 400 UNIT capsule Take 400 Units by mouth daily.   . [DISCONTINUED] amoxicillin (AMOXIL) 875 MG tablet Take 1 tablet (875 mg total) by mouth 2 (two) times daily.   No facility-administered encounter medications on file as of 05/11/2016.      Patient Active Problem List   Diagnosis Date Noted  . Postherpetic neuralgia 04/16/2015  . GERD (gastroesophageal reflux disease) 07/05/2013  . HIV disease ([place]) 05/24/2013  . Hypertension associated with diabetes ([place]) 01/25/2013  . ASHD (arteriosclerotic heart disease) 03/18/2011  . BPH (benign prostatic hyperplasia) 03/18/2011  . Hyperlipidemia associated with type 2 diabetes mellitus ([place]) 03/18/2011  . Diabetes mellitus ([place]) 03/18/2011     Health Maintenance Due  Topic Date Due  . FOOT EXAM  08/17/1952     Review of Systems 12 point ROS is negative. No headache Physical Exam   BP (!)  194/120 (BP Location: Left Arm, Cuff Size: Large)   Pulse 75   Temp 98 F (36.7 C) (Oral)   Wt 186 lb 8 oz (84.6 kg)   BMI 29.21 kg/m  Physical Exam  Constitutional: He is oriented to person, place, and time. He appears well-developed and well-nourished. No distress.  HENT:  Mouth/Throat: Oropharynx is clear and moist. No oropharyngeal exudate.  Cardiovascular: Normal rate, regular rhythm and normal heart sounds. Exam reveals no gallop and no friction rub.  No murmur heard.  Pulmonary/Chest: Effort normal and breath sounds normal. No respiratory distress. He has no wheezes.  Abdominal: Soft. Bowel sounds are normal. He exhibits no distension. There is no tenderness.  Lymphadenopathy:  He has no cervical adenopathy.  Neurological: He is alert and oriented to person, place, and time.  Skin: Skin is warm and dry. No rash noted. No erythema.  Psychiatric: He has a normal mood and affect. His behavior is normal.    Lab Results  Component Value Date   CD4TCELL 38 08/12/2015   Lab Results  Component Value Date   CD4TABS 340 (L) 10/24/2014   CD4TABS 480 07/31/2014   CD4TABS 470 04/09/2014   Lab Results  Component Value Date   HIV1RNAQUANT <20 08/12/2015   Lab Results  Component Value Date   HEPBSAB NEG 05/24/2013   No results found for: RPR  CBC Lab Results  Component Value Date   WBC 5.6 08/12/2015   RBC 6.03 (  H) 08/12/2015   HGB 17.8 (H) 08/12/2015   HCT 49.6 08/12/2015   PLT 102 (L) 08/12/2015   MCV 82.3 08/12/2015   MCH 29.5 08/12/2015   MCHC 35.9 08/12/2015   RDW 15.5 08/12/2015   LYMPHSABS 1.5 08/12/2015   MONOABS 0.6 08/12/2015   EOSABS 0.2 08/12/2015   BASOSABS 0.1 08/12/2015   BMET Lab Results  Component Value Date   NA 139 08/12/2015   K 3.8 08/12/2015   CL 99 08/12/2015   CO2 29 08/12/2015   GLUCOSE 103 (H) 08/12/2015   BUN 14 08/12/2015   CREATININE 1.25 (H) 08/12/2015   CALCIUM 9.2 08/12/2015   GFRNONAA 49 (L) 10/24/2014   GFRAA 56 (L)  10/24/2014     Assessment and Plan   HIV= will check labs, continue with triumeq  HTN = poorly controlled. On repeat sBP 170s. He states that his BP runs 120-130s at home for which he checks regularly. We will keep him on current meds and have him see PCP to adjust if still elevated at his visit next week. 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  Health maintenance = flu vax in the Fall

## 2016-05-12 LAB — T-HELPER CELL (CD4) - (RCID CLINIC ONLY)
CD4 % Helper T Cell: 30 % — ABNORMAL LOW (ref 33–55)
CD4 T Cell Abs: 620 /uL (ref 400–2700)

## 2016-05-13 LAB — HIV-1 RNA QUANT-NO REFLEX-BLD
HIV 1 RNA Quant: 24 copies/mL — ABNORMAL HIGH (ref <20)
HIV-1 RNA QUANT, LOG: 1.38 {Log_copies}/mL — AB (ref <1.30)

## 2016-05-26 ENCOUNTER — Ambulatory Visit: Payer: Self-pay | Admitting: Family Medicine

## 2016-05-26 ENCOUNTER — Other Ambulatory Visit: Payer: Self-pay | Admitting: Internal Medicine

## 2016-05-28 ENCOUNTER — Ambulatory Visit (INDEPENDENT_AMBULATORY_CARE_PROVIDER_SITE_OTHER): Payer: MEDICARE | Admitting: Family Medicine

## 2016-05-28 ENCOUNTER — Encounter: Payer: Self-pay | Admitting: Family Medicine

## 2016-05-28 VITALS — BP 130/90 | HR 64 | Ht 67.0 in | Wt 190.0 lb

## 2016-05-28 DIAGNOSIS — I2510 Atherosclerotic heart disease of native coronary artery without angina pectoris: Secondary | ICD-10-CM

## 2016-05-28 DIAGNOSIS — B0229 Other postherpetic nervous system involvement: Secondary | ICD-10-CM | POA: Diagnosis not present

## 2016-05-28 DIAGNOSIS — B20 Human immunodeficiency virus [HIV] disease: Secondary | ICD-10-CM

## 2016-05-28 DIAGNOSIS — E1159 Type 2 diabetes mellitus with other circulatory complications: Secondary | ICD-10-CM

## 2016-05-28 DIAGNOSIS — E1169 Type 2 diabetes mellitus with other specified complication: Secondary | ICD-10-CM

## 2016-05-28 DIAGNOSIS — K219 Gastro-esophageal reflux disease without esophagitis: Secondary | ICD-10-CM | POA: Diagnosis not present

## 2016-05-28 DIAGNOSIS — E119 Type 2 diabetes mellitus without complications: Secondary | ICD-10-CM

## 2016-05-28 DIAGNOSIS — E785 Hyperlipidemia, unspecified: Secondary | ICD-10-CM | POA: Diagnosis not present

## 2016-05-28 DIAGNOSIS — I10 Essential (primary) hypertension: Secondary | ICD-10-CM | POA: Diagnosis not present

## 2016-05-28 DIAGNOSIS — I152 Hypertension secondary to endocrine disorders: Secondary | ICD-10-CM

## 2016-05-28 LAB — POCT GLYCOSYLATED HEMOGLOBIN (HGB A1C): Hemoglobin A1C: 6.1

## 2016-05-28 NOTE — Progress Notes (Signed)
  Subjective:    Patient ID: KNOXTON ANSLEY, male    DOB: 03-14-1941, 75 y.o.   MRN: [id number]  MARKS LANGOLF is a 75 y.o. male who presents for follow-up of Type 2 diabetes mellitus.  Patient is checking home blood sugars.   Home blood sugar records: 103 to 130 How often is blood sugars being checked: once a day Current symptoms/problems none Daily foot checks: yes   Any foot concerns:  Right foot numbness;Residual from PHN Last eye exam: 06/09/15 Exercise: walking mowing yards He was seen in late July by ID. The blood work is in the chart and was reviewed. He's had no more difficulty with reflux symptoms. He's had no chest pain, shortness of breath or other cardiac related issues. He continues on medications listed in the chart and having no difficulty with them. He did have AAA evaluation which was negative The following portions of the patient's history were reviewed and updated as appropriate: allergies, current medications, past medical history, past social history and problem list.  ROS as in subjective above.     Objective:    Physical Exam Alert and in no distress otherwise not examined.   Lab Review Diabetic Labs Latest Ref Rng & Units 05/11/2016 01/28/2016 08/12/2015 04/16/2015 10/24/2014  HbA1c - - 6.7 6.1 6.1 -  Chol 125 - 200 mg/dL - - 140 - -  HDL >=40 mg/dL - - 47 - -  Calc LDL <130 mg/dL - - 77 - -  Triglycerides <150 mg/dL - - 80 - -  Creatinine 0.70 - 1.18 mg/dL 1.42(H) - 1.25(H) - 1.43(H)   BP/Weight 05/11/2016 02/11/2016 01/28/2016 12/04/2015 [id number]  Systolic BP [id number]  Diastolic BP 123456 80 88 93 78  Wt. (Lbs) 186.5 192 191.6 - 191.8  BMI 29.21 30.06 30 - 30.03   Foot/eye exam completion dates Latest Ref Rng & Units 06/09/2015  Eye Exam No Retinopathy No Retinopathy  Foot Form Completion - -  A1c 6.1  Alessandro  reports that he has quit smoking. His smoking use included Cigarettes. He has a 4.80 pack-year smoking history. He has never used  smokeless tobacco.     Assessment & Plan:    ASHD (arteriosclerotic heart disease)  Hypertension associated with diabetes ([place])  Gastroesophageal reflux disease, esophagitis presence not specified  Postherpetic neuralgia  Type 2 diabetes mellitus without complication, without long-term current use of insulin ([facility])  Hyperlipidemia associated with type 2 diabetes mellitus ([place])  HIV disease ([place])   1. Rx changes: none 2. Education: Reviewed 'ABCs' of diabetes management (respective goals in parentheses):  A1C (<7), blood pressure (<130/80), and cholesterol (LDL <100). 3. Compliance at present is estimated to be good. Efforts to improve compliance (if necessary) will be directed at Continue present lifestyle. 4. Follow up: 4 months Overall he is doing quite well. Encouraged him to continue taking his medications and stay physically active especially with the winter coming up. Also recommend coming back for flu shot

## 2016-06-23 ENCOUNTER — Other Ambulatory Visit: Payer: Self-pay | Admitting: Cardiovascular Disease

## 2016-07-05 ENCOUNTER — Other Ambulatory Visit: Payer: Self-pay | Admitting: Cardiovascular Disease

## 2016-07-06 NOTE — Telephone Encounter (Signed)
Rx request sent to pharmacy.  

## 2016-08-18 ENCOUNTER — Other Ambulatory Visit (INDEPENDENT_AMBULATORY_CARE_PROVIDER_SITE_OTHER): Payer: MEDICARE

## 2016-08-18 DIAGNOSIS — Z23 Encounter for immunization: Secondary | ICD-10-CM | POA: Diagnosis not present

## 2016-08-30 ENCOUNTER — Other Ambulatory Visit: Payer: Self-pay | Admitting: Cardiovascular Disease

## 2016-08-30 NOTE — Telephone Encounter (Signed)
REFILL 

## 2016-10-01 ENCOUNTER — Ambulatory Visit: Payer: MEDICARE | Admitting: Family Medicine

## 2016-10-22 ENCOUNTER — Ambulatory Visit: Payer: MEDICARE | Admitting: Cardiovascular Disease

## 2016-10-26 ENCOUNTER — Encounter: Payer: Self-pay | Admitting: Internal Medicine

## 2016-10-26 ENCOUNTER — Other Ambulatory Visit (HOSPITAL_COMMUNITY)
Admission: RE | Admit: 2016-10-26 | Discharge: 2016-10-26 | Disposition: A | Discharge status: Home / Self Care | Payer: MEDICARE | Source: Ambulatory Visit | Attending: Internal Medicine | Admitting: Internal Medicine

## 2016-10-26 ENCOUNTER — Ambulatory Visit (INDEPENDENT_AMBULATORY_CARE_PROVIDER_SITE_OTHER): Payer: MEDICARE | Admitting: Internal Medicine

## 2016-10-26 VITALS — BP 150/93 | HR 68 | Temp 98.1°F | Ht 68.0 in | Wt 188.0 lb

## 2016-10-26 DIAGNOSIS — B20 Human immunodeficiency virus [HIV] disease: Secondary | ICD-10-CM

## 2016-10-26 DIAGNOSIS — B0229 Other postherpetic nervous system involvement: Secondary | ICD-10-CM

## 2016-10-26 DIAGNOSIS — I2510 Atherosclerotic heart disease of native coronary artery without angina pectoris: Secondary | ICD-10-CM

## 2016-10-26 DIAGNOSIS — N182 Chronic kidney disease, stage 2 (mild): Secondary | ICD-10-CM | POA: Diagnosis not present

## 2016-10-26 DIAGNOSIS — Z113 Encounter for screening for infections with a predominantly sexual mode of transmission: Secondary | ICD-10-CM | POA: Insufficient documentation

## 2016-10-26 LAB — LIPID PANEL
Cholesterol: 138 mg/dL (ref <200)
HDL: 51 mg/dL (ref >40)
LDL CALC: 74 mg/dL (ref <100)
Total CHOL/HDL Ratio: 2.7 Ratio (ref <5.0)
Triglycerides: 65 mg/dL (ref <150)
VLDL: 13 mg/dL (ref <30)

## 2016-10-26 LAB — CBC WITH DIFFERENTIAL/PLATELET
Basophils Absolute: 0 cells/uL (ref 0–200)
Basophils Relative: 0 %
EOS ABS: 112 {cells}/uL (ref 15–500)
Eosinophils Relative: 2 %
HCT: 52.5 % — ABNORMAL HIGH (ref 38.5–50.0)
HEMOGLOBIN: 17.6 g/dL — AB (ref 13.2–17.1)
LYMPHS ABS: 1624 {cells}/uL (ref 850–3900)
Lymphocytes Relative: 29 %
MCH: 28.3 pg (ref 27.0–33.0)
MCHC: 33.5 g/dL (ref 32.0–36.0)
MCV: 84.4 fL (ref 80.0–100.0)
MONOS PCT: 9 %
MPV: 10.8 fL (ref 7.5–12.5)
Monocytes Absolute: 504 cells/uL (ref 200–950)
NEUTROS ABS: 3360 {cells}/uL (ref 1500–7800)
Neutrophils Relative %: 60 %
PLATELETS: 103 10*3/uL — AB (ref 140–400)
RBC: 6.22 MIL/uL — ABNORMAL HIGH (ref 4.20–5.80)
RDW: 16.5 % — ABNORMAL HIGH (ref 11.0–15.0)
WBC: 5.6 10*3/uL (ref 3.8–10.8)

## 2016-10-26 LAB — COMPLETE METABOLIC PANEL WITH GFR
ALT: 35 U/L (ref 9–46)
AST: 35 U/L (ref 10–35)
Albumin: 4.1 g/dL (ref 3.6–5.1)
Alkaline Phosphatase: 50 U/L (ref 40–115)
BILIRUBIN TOTAL: 0.7 mg/dL (ref 0.2–1.2)
BUN: 13 mg/dL (ref 7–25)
CO2: 28 mmol/L (ref 20–31)
CREATININE: 1.37 mg/dL — AB (ref 0.70–1.18)
Calcium: 9.3 mg/dL (ref 8.6–10.3)
Chloride: 102 mmol/L (ref 98–110)
GFR, Est African American: 58 mL/min — ABNORMAL LOW (ref >=60)
GFR, Est Non African American: 50 mL/min — ABNORMAL LOW (ref >=60)
Glucose, Bld: 111 mg/dL — ABNORMAL HIGH (ref 65–99)
Potassium: 4 mmol/L (ref 3.5–5.3)
SODIUM: 139 mmol/L (ref 135–146)
TOTAL PROTEIN: 7.4 g/dL (ref 6.1–8.1)

## 2016-10-26 NOTE — Progress Notes (Signed)
RFV: hiv disease follow up  Patient ID: Jeremy Reeves, male   DOB: January 10, 1941, 76 y.o.   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 CD 4 count of 620/VL24 in July 2017. He is here for 6 month visit. He is doing well overall. Has not had any varicella outbreak. Doing well with adherence. No other issues with health.  Outpatient Encounter Prescriptions as of 10/26/2016  Medication Sig  . abacavir-dolutegravir-lamiVUDine (TRIUMEQ) 600-50-300 MG tablet Take 1 tablet by mouth daily.  . carvedilol (COREG) 3.125 MG tablet Take 1 tablet (3.125 mg total) by mouth 2 (two) times daily with a meal. KEEP OV.  . Cinnamon 500 MG capsule Take 1,000 mg by mouth daily.  . Cranberry 360 MG CAPS Take by mouth.  . Multiple Vitamins-Minerals (MULTIVITAMIN WITH MINERALS) tablet Take 1 tablet by mouth daily.    . niacin (NIASPAN) 1000 MG CR tablet TAKE 2 TABLETS EVERY DAY AT BEDTIME  . omeprazole (PRILOSEC) 20 MG capsule TAKE 1 CAPSULE EVERY DAY  . pravastatin (PRAVACHOL) 40 MG tablet TAKE 1 TABLET EVERY DAY (NEED MD APPOINTMENT)  . vitamin E 400 UNIT capsule Take 400 Units by mouth daily.    No facility-administered encounter medications on file as of 10/26/2016.      Patient Active Problem List   Diagnosis Date Noted  . Postherpetic neuralgia 04/16/2015  . GERD (gastroesophageal reflux disease) 07/05/2013  . HIV disease ([place]) 05/24/2013  . Hypertension associated with diabetes ([place]) 01/25/2013  . ASHD (arteriosclerotic heart disease) 03/18/2011  . BPH (benign prostatic hyperplasia) 03/18/2011  . Hyperlipidemia associated with type 2 diabetes mellitus ([place]) 03/18/2011  . Diabetes mellitus ([place]) 03/18/2011     Health Maintenance Due  Topic Date Due  . FOOT EXAM  08/17/1952  . OPHTHALMOLOGY EXAM  06/08/2016  . URINE MICROALBUMIN  08/11/2016     Review of Systems  Constitutional: Negative for fever, chills, diaphoresis, activity change,  appetite change, fatigue and unexpected weight change.  HENT: Negative for congestion, sore throat, rhinorrhea, sneezing, trouble swallowing and sinus pressure.  Eyes: Negative for photophobia and visual disturbance.  Respiratory: Negative for cough, chest tightness, shortness of breath, wheezing and stridor.  Cardiovascular: Negative for chest pain, palpitations and leg swelling.  Gastrointestinal: Negative for nausea, vomiting, abdominal pain, diarrhea, constipation, blood in stool, abdominal distention and anal bleeding.  Genitourinary: Negative for dysuria, hematuria, flank pain and difficulty urinating.  Musculoskeletal: Negative for myalgias, back pain, joint swelling, arthralgias and gait problem.  Skin: Negative for color change, pallor, rash and wound.  Neurological: Negative for dizziness, tremors, weakness and light-headedness.  Hematological: Negative for adenopathy. Does not bruise/bleed easily.  Psychiatric/Behavioral: Negative for behavioral problems, confusion, sleep disturbance, dysphoric mood, decreased concentration and agitation.    Physical Exam   BP (!) 170/100 Comment: "it's never this high. I feel fine"  Pulse 77   Temp 98.1 F (36.7 C) (Oral)   Ht 5\' 8"  (1.727 m)   Wt 188 lb (85.3 kg)   BMI 28.59 kg/m   Physical Exam  Constitutional: He is oriented to person, place, and time. He appears well-developed and well-nourished. No distress.  HENT:  Mouth/Throat: Oropharynx is clear and moist. No oropharyngeal exudate.  Cardiovascular: Normal rate, regular rhythm and normal heart sounds. Exam reveals no gallop and no friction rub.  No murmur heard.  Pulmonary/Chest: Effort normal and breath sounds normal. No respiratory distress. He has no wheezes.  Abdominal: Soft. Bowel  sounds are normal. He exhibits no distension. There is no tenderness.  Lymphadenopathy:  He has no cervical adenopathy.  Neurological: He is alert and oriented to person, place, and time.  Skin:  Skin is warm and dry. No rash noted. No erythema.  Psychiatric: He has a normal mood and affect. His behavior is normal.    Lab Results  Component Value Date   CD4TCELL 30 (L) 05/11/2016   Lab Results  Component Value Date   CD4TABS 620 05/11/2016   CD4TABS 340 (L) 10/24/2014   CD4TABS 480 07/31/2014   Lab Results  Component Value Date   HIV1RNAQUANT 24 (H) 05/11/2016   Lab Results  Component Value Date   HEPBSAB NEG 05/24/2013   Lab Results  Component Value Date   LABRPR NON REAC 07/31/2014    CBC Lab Results  Component Value Date   WBC 5.9 05/11/2016   RBC 6.14 (H) 05/11/2016   HGB 17.5 (H) 05/11/2016   HCT 51.1 (H) 05/11/2016   PLT 106 (L) 05/11/2016   MCV 83.2 05/11/2016   MCH 28.5 05/11/2016   MCHC 34.2 05/11/2016   RDW 15.3 (H) 05/11/2016   LYMPHSABS 1,947 05/11/2016   MONOABS 531 05/11/2016   EOSABS 177 05/11/2016    BMET Lab Results  Component Value Date   NA 141 05/11/2016   K 4.0 05/11/2016   CL 105 05/11/2016   CO2 28 05/11/2016   GLUCOSE 114 (H) 05/11/2016   BUN 12 05/11/2016   CREATININE 1.42 (H) 05/11/2016   CALCIUM 9.4 05/11/2016   GFRNONAA 49 (L) 05/11/2016   GFRAA 56 (L) 05/11/2016      Assessment and Plan  hiv disease = well controlled. Continue on current regimen. 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

## 2016-10-27 LAB — RPR

## 2016-10-27 LAB — T-HELPER CELL (CD4) - (RCID CLINIC ONLY)
CD4 % Helper T Cell: 33 % (ref 33–55)
CD4 T CELL ABS: 580 /uL (ref 400–2700)

## 2016-10-27 LAB — URINE CYTOLOGY ANCILLARY ONLY
Chlamydia: NEGATIVE
NEISSERIA GONORRHEA: NEGATIVE

## 2016-10-29 LAB — HIV-1 RNA QUANT-NO REFLEX-BLD
HIV 1 RNA Quant: <20 copies/mL (ref <20)
HIV-1 RNA Quant, Log: <1.3 Log copies/mL (ref <1.30)

## 2016-11-03 ENCOUNTER — Other Ambulatory Visit: Payer: Self-pay | Admitting: Cardiovascular Disease

## 2016-11-09 ENCOUNTER — Ambulatory Visit (INDEPENDENT_AMBULATORY_CARE_PROVIDER_SITE_OTHER): Payer: MEDICARE | Admitting: Cardiovascular Disease

## 2016-11-09 ENCOUNTER — Encounter: Payer: Self-pay | Admitting: Cardiovascular Disease

## 2016-11-09 VITALS — BP 142/90 | HR 71 | Ht 68.0 in | Wt 186.0 lb

## 2016-11-09 DIAGNOSIS — I2510 Atherosclerotic heart disease of native coronary artery without angina pectoris: Secondary | ICD-10-CM | POA: Diagnosis not present

## 2016-11-09 DIAGNOSIS — I10 Essential (primary) hypertension: Secondary | ICD-10-CM | POA: Diagnosis not present

## 2016-11-09 DIAGNOSIS — I152 Hypertension secondary to endocrine disorders: Secondary | ICD-10-CM

## 2016-11-09 DIAGNOSIS — E785 Hyperlipidemia, unspecified: Secondary | ICD-10-CM

## 2016-11-09 DIAGNOSIS — E1169 Type 2 diabetes mellitus with other specified complication: Secondary | ICD-10-CM | POA: Diagnosis not present

## 2016-11-09 DIAGNOSIS — E1159 Type 2 diabetes mellitus with other circulatory complications: Secondary | ICD-10-CM

## 2016-11-09 NOTE — Assessment & Plan Note (Signed)
History of hypertension blood pressure measured 142/90. He is on carvedilol. He says his blood pressure is much better when measured at home. Continue current meds are current dosing

## 2016-11-09 NOTE — Patient Instructions (Signed)

## 2016-11-09 NOTE — Assessment & Plan Note (Signed)
History of hyperlipidemia on statin therapy with recent liver profile performed 10/26/16 revealed total cholesterol 138, LDL 74 and HDL of 51

## 2016-11-09 NOTE — Assessment & Plan Note (Signed)
History of coronary artery disease status post coronary artery bypass grafting in 1999 with a LIMA to his LAD, vein to obtuse marginal branch and to the RCA. Myoview performed February 2013 was nonischemic. He denies chest pain or shortness of breath.

## 2016-11-09 NOTE — Progress Notes (Signed)
11/09/2016 Jeremy Reeves   Jan 30, 1941  [id number]  Primary Physician Wyatt Haste, MD Primary Cardiologist: Lorretta Harp MD Lupe Carney, [place]  HPI:  The patient is a 76 year old, moderately overweight, married [place] American male, father of 2, grandfather to 3 grandchildren who I last saw in the office 09/09/15.Marland Kitchen He has a history of ischemic heart disease status post coronary artery bypass grafting in 1999 with a LIMA to his LAD, a vein to an obtuse marginal branch and to the RCA. Myoview performed February 2013 was nonischemic. He denies chest pain or shortness of breath. His most recent lipid profile performed 10/26/16 revealed a total cholesterol 138, LDL 74 and HDL of 51.    Current Outpatient Prescriptions  Medication Sig Dispense Refill  . abacavir-dolutegravir-lamiVUDine (TRIUMEQ) 600-50-300 MG tablet Take 1 tablet by mouth daily. 30 tablet 11  . carvedilol (COREG) 3.125 MG tablet TAKE 1 TABLET TWICE DAILY WITH MEALS (KEEP MD APPOINTMENT) 180 tablet 0  . Cinnamon 500 MG capsule Take 1,000 mg by mouth daily.    . Cranberry 360 MG CAPS Take by mouth.    . Multiple Vitamins-Minerals (MULTIVITAMIN WITH MINERALS) tablet Take 1 tablet by mouth daily.      . niacin (NIASPAN) 1000 MG CR tablet TAKE 2 TABLETS EVERY DAY AT BEDTIME 180 tablet 3  . omeprazole (PRILOSEC) 20 MG capsule TAKE 1 CAPSULE EVERY DAY 90 capsule 3  . pravastatin (PRAVACHOL) 40 MG tablet TAKE 1 TABLET EVERY DAY (NEED MD APPOINTMENT) 60 tablet 4  . vitamin E 400 UNIT capsule Take 400 Units by mouth daily.      No current facility-administered medications for this visit.     Allergies  Allergen Reactions  . Peanut-Containing Drug Products Swelling    Social History   Social History  . Marital status: Married    Spouse name: N/A  . Number of children: N/A  . Years of education: N/A   Occupational History  . Not on file.   Social History Main Topics  . Smoking status: Former Smoker      Packs/day: 0.12    Years: 40.00    Types: Cigarettes  . Smokeless tobacco: Never Used     Comment: 07/04/2013 "I quit smoking before 2000"  . Alcohol use No  . Drug use: No  . Sexual activity: Not Currently    Partners: Female     Comment: declined condoms   Other Topics Concern  . Not on file   Social History Narrative  . No narrative on file     Review of Systems: General: negative for chills, fever, night sweats or weight changes.  Cardiovascular: negative for chest pain, dyspnea on exertion, edema, orthopnea, palpitations, paroxysmal nocturnal dyspnea or shortness of breath Dermatological: negative for rash Respiratory: negative for cough or wheezing Urologic: negative for hematuria Abdominal: negative for nausea, vomiting, diarrhea, bright red blood per rectum, melena, or hematemesis Neurologic: negative for visual changes, syncope, or dizziness All other systems reviewed and are otherwise negative except as noted above.    Blood pressure (!) 142/90, pulse 71, height 5\' 8"  (1.727 m), weight 186 lb (84.4 kg).  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 I personally reviewed this EKG  ASSESSMENT AND PLAN:   Hyperlipidemia associated with type 2 diabetes mellitus History of hyperlipidemia on statin therapy with recent liver profile performed 10/26/16 revealed total cholesterol 138, LDL 74 and HDL of 51  Hypertension associated with diabetes History of hypertension blood pressure measured 142/90. He is on carvedilol. He says his blood  pressure is much better when measured at home. Continue current meds are current dosing  ASHD (arteriosclerotic heart disease) History of coronary artery disease status post coronary artery bypass grafting in 1999 with a LIMA to his LAD, vein to obtuse marginal branch and to the RCA. Myoview performed February 2013 was nonischemic. He denies chest pain or shortness of breath.      Lorretta Harp MD FACP,FACC,FAHA, [facility] 11/09/2016 10:09 AM

## 2016-11-12 ENCOUNTER — Other Ambulatory Visit: Payer: Self-pay | Admitting: Internal Medicine

## 2016-11-12 DIAGNOSIS — B20 Human immunodeficiency virus [HIV] disease: Secondary | ICD-10-CM

## 2016-12-16 DIAGNOSIS — Z86010 Personal history of colonic polyps: Secondary | ICD-10-CM | POA: Diagnosis not present

## 2016-12-24 ENCOUNTER — Other Ambulatory Visit: Payer: Self-pay | Admitting: Internal Medicine

## 2016-12-24 DIAGNOSIS — Z86010 Personal history of colonic polyps: Secondary | ICD-10-CM | POA: Diagnosis not present

## 2017-01-07 ENCOUNTER — Other Ambulatory Visit: Payer: Self-pay | Admitting: Cardiovascular Disease

## 2017-01-07 NOTE — Telephone Encounter (Signed)
REFILL 

## 2017-03-08 ENCOUNTER — Telehealth: Payer: Self-pay | Admitting: Family Medicine

## 2017-03-08 NOTE — Telephone Encounter (Signed)
Left message for pt to call. Pt needs a medicare well visit.

## 2017-04-05 ENCOUNTER — Other Ambulatory Visit: Payer: MEDICARE

## 2017-04-05 DIAGNOSIS — B20 Human immunodeficiency virus [HIV] disease: Secondary | ICD-10-CM | POA: Diagnosis not present

## 2017-04-05 LAB — COMPREHENSIVE METABOLIC PANEL
ALBUMIN: 4.2 g/dL (ref 3.6–5.1)
ALT: 35 U/L (ref 9–46)
AST: 30 U/L (ref 10–35)
Alkaline Phosphatase: 56 U/L (ref 40–115)
BUN: 14 mg/dL (ref 7–25)
CO2: 27 mmol/L (ref 20–31)
CREATININE: 1.44 mg/dL — AB (ref 0.70–1.18)
Calcium: 9.3 mg/dL (ref 8.6–10.3)
Chloride: 103 mmol/L (ref 98–110)
GLUCOSE: 111 mg/dL — AB (ref 65–99)
Potassium: 4.1 mmol/L (ref 3.5–5.3)
SODIUM: 139 mmol/L (ref 135–146)
Total Bilirubin: 0.5 mg/dL (ref 0.2–1.2)
Total Protein: 7.1 g/dL (ref 6.1–8.1)

## 2017-04-05 LAB — CBC WITH DIFFERENTIAL/PLATELET
BASOS ABS: 55 {cells}/uL (ref 0–200)
Basophils Relative: 1 %
EOS ABS: 165 {cells}/uL (ref 15–500)
Eosinophils Relative: 3 %
HEMATOCRIT: 50.8 % — AB (ref 38.5–50.0)
Hemoglobin: 16.9 g/dL (ref 13.2–17.1)
LYMPHS PCT: 26 %
Lymphs Abs: 1430 cells/uL (ref 850–3900)
MCH: 28.5 pg (ref 27.0–33.0)
MCHC: 33.3 g/dL (ref 32.0–36.0)
MCV: 85.8 fL (ref 80.0–100.0)
MONO ABS: 495 {cells}/uL (ref 200–950)
MONOS PCT: 9 %
MPV: 11.3 fL (ref 7.5–12.5)
NEUTROS PCT: 61 %
Neutro Abs: 3355 cells/uL (ref 1500–7800)
Platelets: 114 10*3/uL — ABNORMAL LOW (ref 140–400)
RBC: 5.92 MIL/uL — ABNORMAL HIGH (ref 4.20–5.80)
RDW: 15.5 % — AB (ref 11.0–15.0)
WBC: 5.5 10*3/uL (ref 3.8–10.8)

## 2017-04-06 ENCOUNTER — Other Ambulatory Visit: Payer: Self-pay | Admitting: Cardiovascular Disease

## 2017-04-06 LAB — T-HELPER CELL (CD4) - (RCID CLINIC ONLY)
CD4 T CELL ABS: 590 /uL (ref 400–2700)
CD4 T CELL HELPER: 39 % (ref 33–55)

## 2017-04-07 NOTE — Telephone Encounter (Signed)
Rx(s) sent to pharmacy electronically.  

## 2017-04-08 LAB — HIV-1 RNA QUANT-NO REFLEX-BLD
HIV 1 RNA Quant: <20 copies/mL
HIV-1 RNA Quant, Log: <1.3 Log copies/mL

## 2017-04-19 ENCOUNTER — Encounter: Payer: Self-pay | Admitting: Internal Medicine

## 2017-04-19 ENCOUNTER — Other Ambulatory Visit: Payer: Self-pay | Admitting: *Deleted

## 2017-04-19 ENCOUNTER — Ambulatory Visit (INDEPENDENT_AMBULATORY_CARE_PROVIDER_SITE_OTHER): Payer: MEDICARE | Admitting: Internal Medicine

## 2017-04-19 VITALS — BP 139/82 | HR 73 | Temp 97.4°F | Ht 67.0 in | Wt 184.0 lb

## 2017-04-19 DIAGNOSIS — I2510 Atherosclerotic heart disease of native coronary artery without angina pectoris: Secondary | ICD-10-CM

## 2017-04-19 DIAGNOSIS — B20 Human immunodeficiency virus [HIV] disease: Secondary | ICD-10-CM | POA: Diagnosis not present

## 2017-04-19 DIAGNOSIS — N183 Chronic kidney disease, stage 3 unspecified: Secondary | ICD-10-CM

## 2017-04-19 MED ORDER — ABACAVIR-DOLUTEGRAVIR-LAMIVUD 600-50-300 MG PO TABS
1.0000 | ORAL_TABLET | Freq: Every day | ORAL | 5 refills | Status: DC
Start: 2017-04-19 — End: 2017-04-19

## 2017-04-19 MED ORDER — ABACAVIR-DOLUTEGRAVIR-LAMIVUD 600-50-300 MG PO TABS: 1.0000 | ORAL_TABLET | Freq: Every day | ORAL | 5 refills | Status: DC

## 2017-04-19 NOTE — Progress Notes (Signed)
RFV: follow up for hiv disease  Patient ID: Jeremy Reeves, male   DOB: 29-Jul-1941, 76 y.o.   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 Cd 4 count of 590/VL<20 on triumeq. In good health, has  Not had to be hospitalized since we last saw him. Doing well overall.  Outpatient Encounter Prescriptions as of 04/19/2017  Medication Sig  . carvedilol (COREG) 3.125 MG tablet TAKE 1 TABLET TWICE DAILY WITH MEALS (KEEP MD APPOINTMENT)  . Cinnamon 500 MG capsule Take 1,000 mg by mouth daily.  . Cranberry 360 MG CAPS Take by mouth.  . Multiple Vitamins-Minerals (MULTIVITAMIN WITH MINERALS) tablet Take 1 tablet by mouth daily.    . niacin (NIASPAN) 1000 MG CR tablet TAKE 2 TABLETS EVERY DAY AT BEDTIME  . omeprazole (PRILOSEC) 20 MG capsule TAKE 1 CAPSULE EVERY DAY  . pravastatin (PRAVACHOL) 40 MG tablet TAKE 1 TABLET EVERY DAY  . TRIUMEQ 600-50-300 MG tablet TAKE 1 TABLET BY MOUTH DAILY  . vitamin E 400 UNIT capsule Take 400 Units by mouth daily.    No facility-administered encounter medications on file as of 04/19/2017.      Patient Active Problem List   Diagnosis Date Noted  . Postherpetic neuralgia 04/16/2015  . GERD (gastroesophageal reflux disease) 07/05/2013  . HIV disease ([place]) 05/24/2013  . Hypertension associated with diabetes ([place]) 01/25/2013  . ASHD (arteriosclerotic heart disease) 03/18/2011  . BPH (benign prostatic hyperplasia) 03/18/2011  . Hyperlipidemia associated with type 2 diabetes mellitus ([place]) 03/18/2011  . Diabetes mellitus ([place]) 03/18/2011     Health Maintenance Due  Topic Date Due  . FOOT EXAM  08/17/1952  . OPHTHALMOLOGY EXAM  06/08/2016  . URINE MICROALBUMIN  08/11/2016  . HEMOGLOBIN A1C  11/28/2016     Review of Systems occ right leg neuropathy otherwise 12 point ros is negative Physical Exam   BP 139/82   Pulse 73   Temp 97.4 F (36.3 C) (Oral)   Ht 5\' 7"  (1.702 m)   Wt 184 lb (83.5 kg)   BMI 28.82 kg/m     Physical Exam  Constitutional: He is oriented to person, place, and time. He appears well-developed and well-nourished. No distress.  HENT:  Mouth/Throat: Oropharynx is clear and moist. No oropharyngeal exudate.  Cardiovascular: Normal rate, regular rhythm and normal heart sounds. Exam reveals no gallop and no friction rub.  No murmur heard.  Pulmonary/Chest: Effort normal and breath sounds normal. No respiratory distress. He has no wheezes.  Abdominal: Soft. Bowel sounds are normal. He exhibits no distension. There is no tenderness.  Lymphadenopathy:  He has no cervical adenopathy. Left sided occipital posterior cervical chain -3 height x 3.5cm length firm raised lesion ? lipoma Neurological: He is alert and oriented to person, place, and time.  Skin: Skin is warm and dry. No rash noted. No erythema.  Psychiatric: He has a normal mood and affect. His behavior is normal.    Lab Results  Component Value Date   CD4TCELL 39 04/05/2017   Lab Results  Component Value Date   CD4TABS 590 04/05/2017   CD4TABS 580 10/26/2016   CD4TABS 620 05/11/2016   Lab Results  Component Value Date   HIV1RNAQUANT <20 NOT DETECTED 04/05/2017   Lab Results  Component Value Date   HEPBSAB NEG 05/24/2013   Lab Results  Component Value Date   LABRPR NON REAC 10/26/2016    CBC Lab Results  Component Value Date   WBC  5.5 04/05/2017   RBC 5.92 (H) 04/05/2017   HGB 16.9 04/05/2017   HCT 50.8 (H) 04/05/2017   PLT 114 (L) 04/05/2017   MCV 85.8 04/05/2017   MCH 28.5 04/05/2017   MCHC 33.3 04/05/2017   RDW 15.5 (H) 04/05/2017   LYMPHSABS 1,430 04/05/2017   MONOABS 495 04/05/2017   EOSABS 165 04/05/2017    BMET Lab Results  Component Value Date   NA 139 04/05/2017   K 4.1 04/05/2017   CL 103 04/05/2017   CO2 27 04/05/2017   GLUCOSE 111 (H) 04/05/2017   BUN 14 04/05/2017   CREATININE 1.44 (H) 04/05/2017   CALCIUM 9.3 04/05/2017   GFRNONAA 50 (L) 10/26/2016   GFRAA 58 (L) 10/26/2016     Assessment and Plan  hiv disease = well controlled. Continue on triumeq  Lipoma = continue to monitor  ckd 3 = will check ua for proteinuria, otherwise stable  Out of pocket cost = will need to get paperwork to alleviate

## 2017-04-19 NOTE — Patient Instructions (Signed)
Return in 6 months and do labs 2 wk prior to next appt

## 2017-04-20 LAB — URINALYSIS, ROUTINE W REFLEX MICROSCOPIC
Bilirubin Urine: NEGATIVE
Glucose, UA: NEGATIVE
HGB URINE DIPSTICK: NEGATIVE
KETONES UR: NEGATIVE
LEUKOCYTES UA: NEGATIVE
NITRITE: NEGATIVE
Protein, ur: NEGATIVE
Specific Gravity, Urine: 1.022 (ref 1.001–1.035)
pH: 6 (ref 5.0–8.0)

## 2017-08-01 ENCOUNTER — Other Ambulatory Visit (INDEPENDENT_AMBULATORY_CARE_PROVIDER_SITE_OTHER): Payer: MEDICARE

## 2017-08-01 DIAGNOSIS — Z23 Encounter for immunization: Secondary | ICD-10-CM

## 2017-10-06 ENCOUNTER — Other Ambulatory Visit: Payer: MEDICARE

## 2017-10-06 DIAGNOSIS — B20 Human immunodeficiency virus [HIV] disease: Secondary | ICD-10-CM

## 2017-10-06 LAB — COMPLETE METABOLIC PANEL WITH GFR
AG RATIO: 1.7 (calc) (ref 1.0–2.5)
ALBUMIN MSPROF: 4.3 g/dL (ref 3.6–5.1)
ALT: 28 U/L (ref 9–46)
AST: 31 U/L (ref 10–35)
Alkaline phosphatase (APISO): 55 U/L (ref 40–115)
BUN / CREAT RATIO: 8 (calc) (ref 6–22)
BUN: 11 mg/dL (ref 7–25)
CALCIUM: 9.2 mg/dL (ref 8.6–10.3)
CO2: 27 mmol/L (ref 20–32)
CREATININE: 1.43 mg/dL — AB (ref 0.70–1.18)
Chloride: 102 mmol/L (ref 98–110)
GFR, EST NON AFRICAN AMERICAN: 48 mL/min/{1.73_m2} — AB (ref >=60)
GFR, Est African American: 55 mL/min/{1.73_m2} — ABNORMAL LOW (ref >=60)
GLOBULIN: 2.5 g/dL (ref 1.9–3.7)
Glucose, Bld: 67 mg/dL (ref 65–99)
POTASSIUM: 4 mmol/L (ref 3.5–5.3)
Sodium: 139 mmol/L (ref 135–146)
Total Bilirubin: 0.6 mg/dL (ref 0.2–1.2)
Total Protein: 6.8 g/dL (ref 6.1–8.1)

## 2017-10-06 LAB — CBC WITH DIFFERENTIAL/PLATELET
BASOS ABS: 29 {cells}/uL (ref 0–200)
BASOS PCT: 0.6 %
EOS ABS: 132 {cells}/uL (ref 15–500)
Eosinophils Relative: 2.7 %
HCT: 50.4 % — ABNORMAL HIGH (ref 38.5–50.0)
Hemoglobin: 17.2 g/dL — ABNORMAL HIGH (ref 13.2–17.1)
Lymphs Abs: 1441 cells/uL (ref 850–3900)
MCH: 28.4 pg (ref 27.0–33.0)
MCHC: 34.1 g/dL (ref 32.0–36.0)
MCV: 83.2 fL (ref 80.0–100.0)
MONOS PCT: 13.6 %
MPV: 12.5 fL (ref 7.5–12.5)
NEUTROS PCT: 53.7 %
Neutro Abs: 2631 cells/uL (ref 1500–7800)
PLATELETS: 120 10*3/uL — AB (ref 140–400)
RBC: 6.06 10*6/uL — ABNORMAL HIGH (ref 4.20–5.80)
RDW: 14.8 % (ref 11.0–15.0)
Total Lymphocyte: 29.4 %
WBC: 4.9 10*3/uL (ref 3.8–10.8)
WBCMIX: 666 {cells}/uL (ref 200–950)

## 2017-10-07 LAB — T-HELPER CELL (CD4) - (RCID CLINIC ONLY)
CD4 % Helper T Cell: 37 % (ref 33–55)
CD4 T Cell Abs: 610 /uL (ref 400–2700)

## 2017-10-08 LAB — HIV-1 RNA QUANT-NO REFLEX-BLD
HIV 1 RNA QUANT: NOT DETECTED {copies}/mL
HIV-1 RNA QUANT, LOG: NOT DETECTED {Log_copies}/mL

## 2017-10-15 ENCOUNTER — Other Ambulatory Visit: Payer: Self-pay | Admitting: Internal Medicine

## 2017-10-15 DIAGNOSIS — B20 Human immunodeficiency virus [HIV] disease: Secondary | ICD-10-CM

## 2017-10-17 ENCOUNTER — Other Ambulatory Visit: Payer: Self-pay | Admitting: Cardiovascular Disease

## 2017-10-17 NOTE — Telephone Encounter (Signed)
REFILL 

## 2017-10-20 ENCOUNTER — Ambulatory Visit (INDEPENDENT_AMBULATORY_CARE_PROVIDER_SITE_OTHER): Payer: MEDICARE | Admitting: Internal Medicine

## 2017-10-20 ENCOUNTER — Encounter: Payer: Self-pay | Admitting: Internal Medicine

## 2017-10-20 VITALS — BP 166/100 | HR 105 | Temp 98.2°F | Ht 68.0 in | Wt 183.0 lb

## 2017-10-20 DIAGNOSIS — I10 Essential (primary) hypertension: Secondary | ICD-10-CM

## 2017-10-20 DIAGNOSIS — N183 Chronic kidney disease, stage 3 unspecified: Secondary | ICD-10-CM

## 2017-10-20 DIAGNOSIS — B20 Human immunodeficiency virus [HIV] disease: Secondary | ICD-10-CM | POA: Diagnosis not present

## 2017-10-20 MED ORDER — ABACAVIR-DOLUTEGRAVIR-LAMIVUD 600-50-300 MG PO TABS: 1.0000 | ORAL_TABLET | Freq: Every day | ORAL | 11 refills | Status: DC

## 2017-10-20 NOTE — Progress Notes (Signed)
RFV: follow up for hiv disease  Patient ID: Jeremy Reeves, male   DOB: 1941/08/30, 77 y.o.   MRN: [id number]  HPI  77yo M with HIV disease, CD 4 count of 610/VL<20 in dec 2018. On triumeq. He reports being in good health since we last saw him. He is having difficulties getting his HIV meds from pharmacy in the last pick up. Otherwise, he is doing well. Outpatient Encounter Medications as of 10/20/2017  Medication Sig  . carvedilol (COREG) 3.125 MG tablet Take 1 tablet (3.125 mg total) by mouth 2 (two) times daily with a meal. NEED OV.  . Cinnamon 500 MG capsule Take 1,000 mg by mouth daily.  . Cranberry 360 MG CAPS Take by mouth.  . Multiple Vitamins-Minerals (MULTIVITAMIN WITH MINERALS) tablet Take 1 tablet by mouth daily.    . niacin (NIASPAN) 1000 MG CR tablet TAKE 2 TABLETS EVERY DAY AT BEDTIME  . omeprazole (PRILOSEC) 20 MG capsule TAKE 1 CAPSULE EVERY DAY  . pravastatin (PRAVACHOL) 40 MG tablet TAKE 1 TABLET EVERY DAY  . TRIUMEQ 600-50-300 MG tablet TAKE 1 TABLET BY MOUTH DAILY  . vitamin E 400 UNIT capsule Take 400 Units by mouth daily.    No facility-administered encounter medications on file as of 10/20/2017.      Patient Active Problem List   Diagnosis Date Noted  . Postherpetic neuralgia 04/16/2015  . GERD (gastroesophageal reflux disease) 07/05/2013  . HIV disease ([place]) 05/24/2013  . Hypertension associated with diabetes ([place]) 01/25/2013  . ASHD (arteriosclerotic heart disease) 03/18/2011  . BPH (benign prostatic hyperplasia) 03/18/2011  . Hyperlipidemia associated with type 2 diabetes mellitus ([place]) 03/18/2011  . Diabetes mellitus ([place]) 03/18/2011     Health Maintenance Due  Topic Date Due  . FOOT EXAM  08/17/1952  . OPHTHALMOLOGY EXAM  06/08/2016  . URINE MICROALBUMIN  08/11/2016  . HEMOGLOBIN A1C  11/28/2016    Social History   Tobacco Use  . Smoking status: Former Smoker    Packs/day: 0.12    Years: 40.00    Pack years: 4.80    Types: Cigarettes    . Smokeless tobacco: Never Used  . Tobacco comment: 07/04/2013 "I quit smoking before 2000"  Substance Use Topics  . Alcohol use: No  . Drug use: No   Family History  Problem Relation Age of Onset  . Kidney disease Mother   . Heart disease Father   . Hypertension Brother    Review of Systems Review of Systems  Constitutional: Negative for fever, chills, diaphoresis, activity change, appetite change, fatigue and unexpected weight change.  HENT: Negative for congestion, sore throat, rhinorrhea, sneezing, trouble swallowing and sinus pressure.  Eyes: Negative for photophobia and visual disturbance.  Respiratory: Negative for cough, chest tightness, shortness of breath, wheezing and stridor.  Cardiovascular: Negative for chest pain, palpitations and leg swelling.  Gastrointestinal: Negative for nausea, vomiting, abdominal pain, diarrhea, constipation, blood in stool, abdominal distention and anal bleeding.  Genitourinary: Negative for dysuria, hematuria, flank pain and difficulty urinating.  Musculoskeletal: Negative for myalgias, back pain, joint swelling, arthralgias and gait problem.  Skin: Negative for color change, pallor, rash and wound.  Neurological: Negative for dizziness, tremors, weakness and light-headedness.  Hematological: Negative for adenopathy. Does not bruise/bleed easily.  Psychiatric/Behavioral: Negative for behavioral problems, confusion, sleep disturbance, dysphoric mood, decreased concentration and agitation.    Physical Exam   BP (!) 166/100   Pulse (!) 105   Temp 98.2 F (36.8 C) (Oral)   Ht  5\' 8"  (1.727 m)   Wt 183 lb (83 kg)   BMI 27.83 kg/m   Physical Exam  Constitutional: He is oriented to person, place, and time. He appears well-developed and well-nourished. No distress.  HENT:  Mouth/Throat: Oropharynx is clear and moist. No oropharyngeal exudate.  Cardiovascular: Normal rate, regular rhythm and normal heart sounds. Exam reveals no gallop and no  friction rub.  No murmur heard.  Pulmonary/Chest: Effort normal and breath sounds normal. No respiratory distress. He has no wheezes.  Abdominal: Soft. Bowel sounds are normal. He exhibits no distension. There is no tenderness.  Lymphadenopathy:  He has no cervical adenopathy.  Neurological: He is alert and oriented to person, place, and time.  Skin: Skin is warm and dry. No rash noted. No erythema.  Psychiatric: He has a normal mood and affect. His behavior is normal.    Lab Results  Component Value Date   CD4TCELL 37 10/06/2017   Lab Results  Component Value Date   CD4TABS 610 10/06/2017   CD4TABS 590 04/05/2017   CD4TABS 580 10/26/2016   Lab Results  Component Value Date   HIV1RNAQUANT <20 NOT DETECTED 10/06/2017   Lab Results  Component Value Date   HEPBSAB NEG 05/24/2013   Lab Results  Component Value Date   LABRPR NON REAC 10/26/2016    CBC Lab Results  Component Value Date   WBC 4.9 10/06/2017   RBC 6.06 (H) 10/06/2017   HGB 17.2 (H) 10/06/2017   HCT 50.4 (H) 10/06/2017   PLT 120 (L) 10/06/2017   MCV 83.2 10/06/2017   MCH 28.4 10/06/2017   MCHC 34.1 10/06/2017   RDW 14.8 10/06/2017   LYMPHSABS 1,441 10/06/2017   MONOABS 495 04/05/2017   EOSABS 132 10/06/2017    BMET Lab Results  Component Value Date   NA 139 10/06/2017   K 4.0 10/06/2017   CL 102 10/06/2017   CO2 27 10/06/2017   GLUCOSE 67 10/06/2017   BUN 11 10/06/2017   CREATININE 1.43 (H) 10/06/2017   CALCIUM 9.2 10/06/2017   GFRNONAA 48 (L) 10/06/2017   GFRAA 55 (L) 10/06/2017      Assessment and Plan  hiv disease = continue on current regimen. Will reach out to pharmacy to see what barriers exist to access of medication.  ckd 3 = will cr appears stable. Will check ua to see if any proteinuria  Htn = will have patient follow up with PCP since he is not at goal. Health maintenance = up to date on vaccines

## 2017-10-20 NOTE — Progress Notes (Signed)
HPI: Jeremy Reeves is a 77 y.o. male who is here for his HIV f/u with [name].   Allergies: Allergies  Allergen Reactions  . Peanut-Containing Drug Products Swelling    Vitals: Temp: 98.2 F (36.8 C) (01/03 0936) Temp Source: Oral (01/03 0936) BP: 166/100 (01/03 0936) Pulse Rate: 105 (01/03 0936)  Past Medical History: Past Medical History:  Diagnosis Date  . ASHD (arteriosclerotic heart disease)   . BPH (benign prostatic hyperplasia)   . Difficult intubation    anesthesia record 1999 / record on chart  . Foley catheter in place 10/23/12  . GERD (gastroesophageal reflux disease)   . HH (hiatus hernia)   . Hiatal hernia   . HIV (human immunodeficiency virus infection) ([place]) dx'd 2014  . Hyperlipidemia   . Hypertension   . Shingles 8/14  . Type II diabetes mellitus ([place])    pre diabetes    Social History: Social History   Socioeconomic History  . Marital status: Married    Spouse name: None  . Number of children: None  . Years of education: None  . Highest education level: None  Social Needs  . Financial resource strain: None  . Food insecurity - worry: None  . Food insecurity - inability: None  . Transportation needs - medical: None  . Transportation needs - non-medical: None  Occupational History  . None  Tobacco Use  . Smoking status: Former Smoker    Packs/day: 0.12    Years: 40.00    Pack years: 4.80    Types: Cigarettes  . Smokeless tobacco: Never Used  . Tobacco comment: 07/04/2013 "I quit smoking before 2000"  Substance and Sexual Activity  . Alcohol use: No  . Drug use: No  . Sexual activity: Not Currently    Partners: Female    Comment: declined condoms  Other Topics Concern  . None  Social History Narrative  . None    Previous Regimen:   Current Regimen: Triumeq  Labs: HIV 1 RNA Quant (copies/mL)  Date Value  10/06/2017 <20 NOT DETECTED  04/05/2017 <20 NOT DETECTED  10/26/2016 <20   CD4 T Cell Abs (/uL)  Date Value   10/06/2017 610  04/05/2017 590  10/26/2016 580   Hep B S Ab (no units)  Date Value  05/24/2013 NEG   Hepatitis B Surface Ag (no units)  Date Value  05/24/2013 NEGATIVE   HCV Ab (no units)  Date Value  05/24/2013 NEGATIVE    CrCl: Estimated Creatinine Clearance: 46.8 mL/min (A) (by C-G formula based on SCr of 1.43 mg/dL (H)).  Lipids:    Component Value Date/Time   CHOL 138 10/26/2016 0922   TRIG 65 10/26/2016 0922   HDL 51 10/26/2016 0922   CHOLHDL 2.7 10/26/2016 0922   VLDL 13 10/26/2016 0922   LDLCALC 74 10/26/2016 0922    Assessment: Jeremy Reeves is doing well on his Triumeq. [name] stated that he has been having issue with some copay with the [place]. We were planning to tx it to [facility] but he said that his wife just called and said the issue has been resolved. He rather stays with them right now and if it happens again in the future, he will tx to [place].   Recommendations:  Cont Triumeq 1 PO qday  Onnie Boer, PharmD, BCPS, AAHIVP, CPP Clinical Infectious [facility] 10/20/2017, 10:04 AM

## 2017-10-28 ENCOUNTER — Encounter: Payer: Self-pay | Admitting: Cardiovascular Disease

## 2017-10-28 ENCOUNTER — Ambulatory Visit (INDEPENDENT_AMBULATORY_CARE_PROVIDER_SITE_OTHER): Payer: MEDICARE | Admitting: Cardiovascular Disease

## 2017-10-28 VITALS — BP 168/88 | HR 82 | Ht 67.5 in | Wt 184.2 lb

## 2017-10-28 DIAGNOSIS — E785 Hyperlipidemia, unspecified: Secondary | ICD-10-CM | POA: Diagnosis not present

## 2017-10-28 DIAGNOSIS — I152 Hypertension secondary to endocrine disorders: Secondary | ICD-10-CM

## 2017-10-28 DIAGNOSIS — I10 Essential (primary) hypertension: Secondary | ICD-10-CM | POA: Diagnosis not present

## 2017-10-28 DIAGNOSIS — I2510 Atherosclerotic heart disease of native coronary artery without angina pectoris: Secondary | ICD-10-CM | POA: Diagnosis not present

## 2017-10-28 DIAGNOSIS — E1159 Type 2 diabetes mellitus with other circulatory complications: Secondary | ICD-10-CM

## 2017-10-28 DIAGNOSIS — E1169 Type 2 diabetes mellitus with other specified complication: Secondary | ICD-10-CM | POA: Diagnosis not present

## 2017-10-28 NOTE — Assessment & Plan Note (Addendum)
History of hyperlipidemia on statin therapy with lipid profile performed 10/26/16 revealed total cholesterol of 138, LDL 74 and HDL of 51.

## 2017-10-28 NOTE — Progress Notes (Signed)
10/28/2017 Jeremy Reeves   [birth year]  [id number]  Primary Physician Denita Lung, MD Primary Cardiologist: Lorretta Harp MD FACP, [place], [place], [place]  HPI:  Jeremy Reeves is a 77 y.o.  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 Myoview performed February 2013 was nonischemic. He denies chest pain or shortness of breath. His most recent lipid profile performed 10/26/16 revealed a total cholesterol 138, LDL 74 and HDL of 51.       Current Meds  Medication Sig  . abacavir-dolutegravir-lamiVUDine (TRIUMEQ) 600-50-300 MG tablet Take 1 tablet by mouth daily.  . carvedilol (COREG) 3.125 MG tablet Take 1 tablet (3.125 mg total) by mouth 2 (two) times daily with a meal. NEED OV.  . Cinnamon 500 MG capsule Take 1,000 mg by mouth daily.  . Cranberry 360 MG CAPS Take by mouth.  . Multiple Vitamins-Minerals (MULTIVITAMIN WITH MINERALS) tablet Take 1 tablet by mouth daily.    . niacin (NIASPAN) 1000 MG CR tablet TAKE 2 TABLETS EVERY DAY AT BEDTIME  . omeprazole (PRILOSEC) 20 MG capsule TAKE 1 CAPSULE EVERY DAY  . pravastatin (PRAVACHOL) 40 MG tablet TAKE 1 TABLET EVERY DAY  . vitamin E 400 UNIT capsule Take 400 Units by mouth daily.      Allergies  Allergen Reactions  . Peanut-Containing Drug Products Swelling    Social History   Socioeconomic History  . Marital status: Married    Spouse name: Not on file  . Number of children: Not on file  . Years of education: Not on file  . Highest education level: Not on file  Social Needs  . Financial resource strain: Not on file  . Food insecurity - worry: Not on file  . Food insecurity - inability: Not on file  . Transportation needs - medical: Not on file  .  Transportation needs - non-medical: Not on file  Occupational History  . Not on file  Tobacco Use  . Smoking status: Former Smoker    Packs/day: 0.12    Years: 40.00    Pack years: 4.80    Types: Cigarettes  . Smokeless tobacco: Never Used  . Tobacco comment: 07/04/2013 "I quit smoking before 2000"  Substance and Sexual Activity  . Alcohol use: No  . Drug use: No  . Sexual activity: Not Currently    Partners: Female    Comment: declined condoms  Other Topics Concern  . Not on file  Social History Narrative  . Not on file     Review of Systems: General: negative for chills, fever, night sweats or weight changes.  Cardiovascular: negative for chest pain, dyspnea on exertion, edema, orthopnea, palpitations, paroxysmal nocturnal dyspnea or shortness of breath Dermatological: negative for rash Respiratory: negative for cough or wheezing Urologic: negative for hematuria Abdominal: negative for nausea, vomiting, diarrhea, bright red blood per rectum, melena, or hematemesis Neurologic: negative for visual changes, syncope, or dizziness All other systems reviewed and are otherwise negative except as noted above.    Blood pressure (!) 168/88, pulse 82, height 5' 7.5" (1.715 m), weight 184 lb 3.2 oz (83.6 kg).  General appearance: alert and no distress Neck: no adenopathy, no carotid bruit, no JVD, supple, symmetrical, trachea  midline and thyroid not enlarged, symmetric, no tenderness/mass/nodules Lungs: clear to auscultation bilaterally Heart: regular rate and rhythm, S1, S2 normal, no murmur, click, rub or gallop Extremities: extremities normal, atraumatic, no cyanosis or edema Pulses: 2+ and symmetric Skin: Skin color, texture, turgor normal. No rashes or lesions Neurologic: Alert and oriented X 3, normal strength and tone. Normal symmetric reflexes. 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 I personally reviewed this EKG.  ASSESSMENT AND PLAN:   Hyperlipidemia associated with type 2 diabetes mellitus History of hyperlipidemia on statin therapy with lipid profile performed 10/26/16 revealed total cholesterol of 138, LDL 74 and HDL of 51.  Hypertension associated with diabetes History of essential hypertension blood pressure measured at 168/88. He says when he takes at home is much better than this. He is on carvedilol. Continue current meds at current dosing.  ASHD (arteriosclerotic heart disease) History of CAD status post bypass grafting in 1999 with a nonischemic Myoview February 2013. He denies chest pain or shortness of breath.      Lorretta Harp MD FACP,FACC,FAHA, [facility] 10/28/2017 5:00 PM

## 2017-10-28 NOTE — Assessment & Plan Note (Signed)
History of essential hypertension blood pressure measured at 168/88. He says when he takes at home is much better than this. He is on carvedilol. Continue current meds at current dosing.

## 2017-10-28 NOTE — Patient Instructions (Signed)

## 2017-10-28 NOTE — Assessment & Plan Note (Signed)
History of CAD status post bypass grafting in 1999 with a nonischemic Myoview February 2013. He denies chest pain or shortness of breath.

## 2017-11-07 ENCOUNTER — Other Ambulatory Visit: Payer: Self-pay | Admitting: Cardiovascular Disease

## 2017-11-28 ENCOUNTER — Ambulatory Visit (INDEPENDENT_AMBULATORY_CARE_PROVIDER_SITE_OTHER): Payer: MEDICARE | Admitting: Family Medicine

## 2017-11-28 ENCOUNTER — Encounter: Payer: Self-pay | Admitting: Family Medicine

## 2017-11-28 VITALS — BP 138/88 | HR 86 | Ht 65.0 in | Wt 184.4 lb

## 2017-11-28 DIAGNOSIS — B20 Human immunodeficiency virus [HIV] disease: Secondary | ICD-10-CM | POA: Diagnosis not present

## 2017-11-28 DIAGNOSIS — E1169 Type 2 diabetes mellitus with other specified complication: Secondary | ICD-10-CM | POA: Diagnosis not present

## 2017-11-28 DIAGNOSIS — E1159 Type 2 diabetes mellitus with other circulatory complications: Secondary | ICD-10-CM | POA: Diagnosis not present

## 2017-11-28 DIAGNOSIS — B0229 Other postherpetic nervous system involvement: Secondary | ICD-10-CM

## 2017-11-28 DIAGNOSIS — E785 Hyperlipidemia, unspecified: Secondary | ICD-10-CM

## 2017-11-28 DIAGNOSIS — I2510 Atherosclerotic heart disease of native coronary artery without angina pectoris: Secondary | ICD-10-CM

## 2017-11-28 DIAGNOSIS — E119 Type 2 diabetes mellitus without complications: Secondary | ICD-10-CM | POA: Diagnosis not present

## 2017-11-28 DIAGNOSIS — I10 Essential (primary) hypertension: Secondary | ICD-10-CM | POA: Diagnosis not present

## 2017-11-28 LAB — POCT GLYCOSYLATED HEMOGLOBIN (HGB A1C)

## 2017-11-28 MED ORDER — CARVEDILOL 6.25 MG PO TABS: 6.2500 mg | ORAL_TABLET | Freq: Two times a day (BID) | ORAL | 3 refills | Status: DC

## 2017-11-28 NOTE — Progress Notes (Signed)
Jeremy Reeves is a 77 y.o. male who presents for annual wellness visit and follow-up on chronic medical conditions.  He has no particular concerns or complaints.  He is followed regularly by infectious disease as well as cardiology.  He also has a history of type 2 diabetes and does keep track of his blood sugars.  They usually run in the low 100s.  He does have a history of reflux disease but rarely takes medication for that.  He does not complain of chest pain, PND, DOE or shortness of breath.  No fever, chills, weight change.  He is on multiple medications and has no concerns about them.  He still does complain of a tingling sensation in his feet from the PHN.   Immunizations and Health Maintenance Immunization History  Administered Date(s) Administered  . Hepatitis B, adult 12/10/2013, 01/07/2014, 07/31/2014  . Influenza Split 09/08/2011, 08/15/2012  . Influenza, High Dose Seasonal PF 08/12/2015, 08/18/2016, 08/01/2017  . Influenza,inj,Quad PF,6+ Mos 06/08/2013, 07/31/2014  . Pneumococcal Conjugate-13 08/12/2015  . Pneumococcal Polysaccharide-23 01/12/2008, 08/21/2013  . Td 12/04/2015  . Tdap 01/12/2008  . Zoster 12/19/2009   Health Maintenance Due  Topic Date Due  . FOOT EXAM  08/17/1952  . OPHTHALMOLOGY EXAM  06/08/2016  . URINE MICROALBUMIN  08/11/2016  . HEMOGLOBIN A1C  11/28/2016    Last colonoscopy: over a year Last PSA: last year Dentist: last year Ophtho: over a year Exercise: walking , twice a week  Other doctors caring for patient include:Snider Alan Mulder Advanced Directives:yes copy asked for    Depression screen:  See questionnaire below.     Depression screen [facility] 2/9 10/20/2017 10/26/2016 05/11/2016 11/11/2015 08/12/2015  Decreased Interest 0 0 0 0 0  Down, Depressed, Hopeless 0 0 0 0 0  PHQ - 2 Score 0 0 0 0 0    Fall Screen: See Questionaire below.   Fall Risk  10/20/2017 10/26/2016 05/11/2016 11/11/2015 08/12/2015  Falls in the past year? No No No No No     ADL screen:  See questionnaire below.  Functional Status Survey:  normal   Review of Systems  Constitutional: -, -unexpected weight change, -anorexia, -fatigue Allergy: -sneezing, -itching, -congestion Dermatology: denies changing moles, rash, lumps ENT: -runny nose, -ear pain, -sore throat,  Cardiology:  -chest pain, -palpitations, -orthopnea, Respiratory: -cough, -shortness of breath, -dyspnea on exertion, -wheezing,  Gastroenterology: -abdominal pain, -nausea, -vomiting, -diarrhea, -constipation, -dysphagia Hematology: -bleeding or bruising problems Musculoskeletal: -arthralgias, -myalgias, -joint swelling, -back pain, - Ophthalmology: -vision changes,  Urology: -dysuria, -difficulty urinating,  -urinary frequency, -urgency, incontinence Neurology: -, -numbness, , -memory loss, -falls, -dizziness    PHYSICAL EXAM:   General Appearance: Alert, cooperative, no distress, appears stated age Head: Normocephalic, without obvious abnormality, atraumatic Eyes: PERRL, conjunctiva/corneas clear, EOM's intact, fundi benign Ears: Normal TM's and external ear canals Nose: Nares normal, mucosa normal, no drainage or sinus   tenderness Throat: Lips, mucosa, and tongue normal; teeth and gums normal Neck: Supple, no lymphadenopathy, thyroid:no enlargement/tenderness/nodules; no carotid bruit or JVD Lungs: Clear to auscultation bilaterally without wheezes, rales or ronchi; respirations unlabored Heart: Irregular rate noted, S1 and S2 normal, no murmur, rub or gallop Abdomen: Soft, non-tender, nondistended, normoactive bowel sounds, no masses, no hepatosplenomegaly Extremities: No clubbing, cyanosis or edema Pulses: 2+ and symmetric all extremities Skin: Skin color, texture, turgor normal, no rashes or lesions Lymph nodes: Cervical, supraclavicular, and axillary nodes normal Neurologic: CNII-XII intact, normal strength, sensation and gait; reflexes 2+ and symmetric throughout  Psych:  Normal mood, affect, hygiene and grooming EKG does show a sinus bradycardia and premature supraventricular complexes.  Slightly worse than previous EKG. 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  I will send a note to Dr. Alvester Chou concerning his EKG.  He will continue on his present medication regimen.    Discussed PSA screening (risks/benefits), recommended at least 30 minutes of aerobic activity at least 5 days/week; proper sunscreen use reviewed; healthy diet and alcohol recommendations (less than or equal to 2 drinks/day) reviewed; regular seatbelt use; changing batteries in smoke detectors. Immunization recommendations discussed.  Colonoscopy recommendations reviewed.   Medicare Attestation I have personally reviewed: The patient's medical and social history Their use of alcohol, tobacco or illicit drugs Their current medications and supplements The patient's functional ability including ADLs,fall risks, home safety risks, cognitive, and hearing and visual impairment Diet and physical activities Evidence for depression or mood disorders  The patient's weight, height, and BMI have been recorded in the chart.  I have made referrals, counseling, and provided education to the patient based on review of the above and I have provided the patient with a written personalized care plan for preventive services.     Jill Alexanders, MD   11/28/2017

## 2017-11-29 LAB — SPECIMEN STATUS

## 2017-11-29 LAB — LIPID PANEL
Chol/HDL Ratio: 2.8 ratio (ref 0.0–5.0)
Cholesterol, Total: 125 mg/dL (ref 100–199)
HDL: 45 mg/dL (ref >39)
LDL CALC: 66 mg/dL (ref 0–99)
TRIGLYCERIDES: 72 mg/dL (ref 0–149)
VLDL CHOLESTEROL CAL: 14 mg/dL (ref 5–40)

## 2017-11-30 LAB — CBC WITH DIFFERENTIAL/PLATELET
BASOS: 1 %
Basophils Absolute: 0 10*3/uL (ref 0.0–0.2)
EOS (ABSOLUTE): 0.2 10*3/uL (ref 0.0–0.4)
EOS: 4 %
HEMATOCRIT: 49.5 % (ref 37.5–51.0)
HEMOGLOBIN: 17 g/dL (ref 13.0–17.7)
IMMATURE GRANS (ABS): 0 10*3/uL (ref 0.0–0.1)
Immature Granulocytes: 0 %
LYMPHS: 27 %
Lymphocytes Absolute: 1.5 10*3/uL (ref 0.7–3.1)
MCH: 29 pg (ref 26.6–33.0)
MCHC: 34.3 g/dL (ref 31.5–35.7)
MCV: 84 fL (ref 79–97)
Monocytes Absolute: 0.6 10*3/uL (ref 0.1–0.9)
Monocytes: 11 %
NEUTROS ABS: 3.1 10*3/uL (ref 1.4–7.0)
Neutrophils: 57 %
Platelets: 96 10*3/uL — CL (ref 150–379)
RBC: 5.87 x10E6/uL — ABNORMAL HIGH (ref 4.14–5.80)
RDW: 15.6 % — AB (ref 12.3–15.4)
WBC: 5.5 10*3/uL (ref 3.4–10.8)

## 2017-11-30 LAB — SPECIMEN STATUS REPORT

## 2017-12-26 ENCOUNTER — Ambulatory Visit: Payer: MEDICARE | Admitting: Family Medicine

## 2018-01-02 ENCOUNTER — Other Ambulatory Visit: Payer: Self-pay | Admitting: Internal Medicine

## 2018-01-10 ENCOUNTER — Ambulatory Visit: Payer: Self-pay | Admitting: Family Medicine

## 2018-01-10 ENCOUNTER — Telehealth: Payer: Self-pay | Admitting: Family Medicine

## 2018-01-10 DIAGNOSIS — Z1211 Encounter for screening for malignant neoplasm of colon: Secondary | ICD-10-CM

## 2018-01-10 NOTE — Telephone Encounter (Signed)
Put the order in

## 2018-01-10 NOTE — Telephone Encounter (Signed)
Pt's wife came in and dropped of lab/test request from [facility]. Sending back. Pt can be reached at [phone].

## 2018-01-11 NOTE — Telephone Encounter (Signed)
Done KH 

## 2018-01-12 ENCOUNTER — Telehealth: Payer: Self-pay

## 2018-01-12 NOTE — Telephone Encounter (Signed)
Called pt to let him know Dr. Redmond School says pt does not need a colonoscopy until 2022 . No answer left message. [place]

## 2018-01-30 ENCOUNTER — Other Ambulatory Visit: Payer: Self-pay | Admitting: Family Medicine

## 2018-01-30 DIAGNOSIS — I10 Essential (primary) hypertension: Principal | ICD-10-CM

## 2018-01-30 DIAGNOSIS — E1159 Type 2 diabetes mellitus with other circulatory complications: Secondary | ICD-10-CM

## 2018-03-22 ENCOUNTER — Telehealth: Payer: Self-pay

## 2018-03-22 NOTE — Telephone Encounter (Signed)
Called pt to ask about his cologuard test. Pt has not sent in a sample and can still do so. No answer lvm to call back to office [facility]

## 2018-03-24 ENCOUNTER — Encounter: Payer: Self-pay | Admitting: Family Medicine

## 2018-04-19 ENCOUNTER — Ambulatory Visit (INDEPENDENT_AMBULATORY_CARE_PROVIDER_SITE_OTHER): Payer: MEDICARE | Admitting: Internal Medicine

## 2018-04-19 ENCOUNTER — Encounter: Payer: Self-pay | Admitting: Internal Medicine

## 2018-04-19 VITALS — BP 170/115 | HR 57 | Temp 98.1°F | Wt 182.0 lb

## 2018-04-19 DIAGNOSIS — B20 Human immunodeficiency virus [HIV] disease: Secondary | ICD-10-CM

## 2018-04-19 DIAGNOSIS — N183 Chronic kidney disease, stage 3 unspecified: Secondary | ICD-10-CM

## 2018-04-19 DIAGNOSIS — I2510 Atherosclerotic heart disease of native coronary artery without angina pectoris: Secondary | ICD-10-CM

## 2018-04-19 DIAGNOSIS — I10 Essential (primary) hypertension: Secondary | ICD-10-CM | POA: Diagnosis not present

## 2018-04-20 LAB — CBC WITH DIFFERENTIAL/PLATELET
BASOS ABS: 31 {cells}/uL (ref 0–200)
Basophils Relative: 0.5 %
Eosinophils Absolute: 140 cells/uL (ref 15–500)
Eosinophils Relative: 2.3 %
HEMATOCRIT: 49.9 % (ref 38.5–50.0)
Hemoglobin: 17.1 g/dL (ref 13.2–17.1)
LYMPHS ABS: 1635 {cells}/uL (ref 850–3900)
MCH: 28.7 pg (ref 27.0–33.0)
MCHC: 34.3 g/dL (ref 32.0–36.0)
MCV: 83.7 fL (ref 80.0–100.0)
MPV: 12.7 fL — ABNORMAL HIGH (ref 7.5–12.5)
Monocytes Relative: 14.5 %
Neutro Abs: 3410 cells/uL (ref 1500–7800)
Neutrophils Relative %: 55.9 %
PLATELETS: 105 10*3/uL — AB (ref 140–400)
RBC: 5.96 10*6/uL — ABNORMAL HIGH (ref 4.20–5.80)
RDW: 14.6 % (ref 11.0–15.0)
TOTAL LYMPHOCYTE: 26.8 %
WBC: 6.1 10*3/uL (ref 3.8–10.8)
WBCMIX: 885 {cells}/uL (ref 200–950)

## 2018-04-20 LAB — COMPLETE METABOLIC PANEL WITH GFR
AG RATIO: 1.8 (calc) (ref 1.0–2.5)
ALT: 32 U/L (ref 9–46)
AST: 36 U/L — AB (ref 10–35)
Albumin: 4.4 g/dL (ref 3.6–5.1)
Alkaline phosphatase (APISO): 52 U/L (ref 40–115)
BILIRUBIN TOTAL: 0.5 mg/dL (ref 0.2–1.2)
BUN/Creatinine Ratio: 9 (calc) (ref 6–22)
BUN: 13 mg/dL (ref 7–25)
CHLORIDE: 106 mmol/L (ref 98–110)
CO2: 29 mmol/L (ref 20–32)
Calcium: 9.2 mg/dL (ref 8.6–10.3)
Creat: 1.51 mg/dL — ABNORMAL HIGH (ref 0.70–1.18)
GFR, EST AFRICAN AMERICAN: 52 mL/min/{1.73_m2} — AB (ref >=60)
GFR, Est Non African American: 45 mL/min/{1.73_m2} — ABNORMAL LOW (ref >=60)
GLOBULIN: 2.5 g/dL (ref 1.9–3.7)
Glucose, Bld: 80 mg/dL (ref 65–99)
POTASSIUM: 3.8 mmol/L (ref 3.5–5.3)
SODIUM: 142 mmol/L (ref 135–146)
Total Protein: 6.9 g/dL (ref 6.1–8.1)

## 2018-04-21 LAB — T-HELPER CELL (CD4) - (RCID CLINIC ONLY)
CD4 T CELL ABS: 630 /uL (ref 400–2700)
CD4 T CELL HELPER: 33 % (ref 33–55)

## 2018-04-24 LAB — HIV-1 RNA QUANT-NO REFLEX-BLD
HIV 1 RNA Quant: <20 copies/mL
HIV-1 RNA Quant, Log: <1.3 Log copies/mL

## 2018-04-28 NOTE — Progress Notes (Signed)
RFV: follow up for hiv disease Patient ID: Jeremy Reeves, male   DOB: 1941/08/25, 77 y.o.   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 He reports being in good health since we last saw him. No issues with adherence. He has not had issues with zosters neuropathy  Outpatient Encounter Medications as of 04/19/2018  Medication Sig  . abacavir-dolutegravir-lamiVUDine (TRIUMEQ) 600-50-300 MG tablet Take 1 tablet by mouth daily.  . carvedilol (COREG) 6.25 MG tablet TAKE 1 TABLET TWICE DAILY WITH A MEAL  . Cinnamon 500 MG capsule Take 1,000 mg by mouth daily.  . Cranberry 360 MG CAPS Take by mouth.  . Multiple Vitamins-Minerals (MULTIVITAMIN WITH MINERALS) tablet Take 1 tablet by mouth daily.    . niacin (NIASPAN) 1000 MG CR tablet TAKE 2 TABLETS EVERY DAY AT BEDTIME  . omeprazole (PRILOSEC) 20 MG capsule TAKE 1 CAPSULE EVERY DAY  . pravastatin (PRAVACHOL) 40 MG tablet TAKE 1 TABLET EVERY DAY  . vitamin E 400 UNIT capsule Take 400 Units by mouth daily.    No facility-administered encounter medications on file as of 04/19/2018.      Patient Active Problem List   Diagnosis Date Noted  . Postherpetic neuralgia 04/16/2015  . GERD (gastroesophageal reflux disease) 07/05/2013  . 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 Hypertension associated with diabetes ([place]) 01/25/2013  . ASHD (arteriosclerotic heart disease) 03/18/2011  . BPH (benign prostatic hyperplasia) 03/18/2011  . Hyperlipidemia associated with type 2 diabetes mellitus ([place]) 03/18/2011  . Diabetes mellitus ([place]) 03/18/2011     Health Maintenance Due  Topic Date Due  . FOOT EXAM  08/17/1952  . OPHTHALMOLOGY EXAM  06/08/2016  . URINE MICROALBUMIN  08/11/2016    Social History   Tobacco Use  . Smoking status: Former Smoker    Packs/day: 0.12    Years: 40.00    Pack years: 4.80    Types: Cigarettes  . Smokeless tobacco: Never Used  . Tobacco comment: 07/04/2013 "I quit smoking before 2000"    Substance Use Topics  . Alcohol use: No  . Drug use: No    Review of Systems Review of Systems  Constitutional: Negative for fever, chills, diaphoresis, activity change, appetite change, fatigue and unexpected weight change.  HENT: Negative for congestion, sore throat, rhinorrhea, sneezing, trouble swallowing and sinus pressure.  Eyes: Negative for photophobia and visual disturbance.  Respiratory: Negative for cough, chest tightness, shortness of breath, wheezing and stridor.  Cardiovascular: Negative for chest pain, palpitations and leg swelling.  Gastrointestinal: Negative for nausea, vomiting, abdominal pain, diarrhea, constipation, blood in stool, abdominal distention and anal bleeding.  Genitourinary: Negative for dysuria, hematuria, flank pain and difficulty urinating.  Musculoskeletal: Negative for myalgias, back pain, joint swelling, arthralgias and gait problem.  Skin: Negative for color change, pallor, rash and wound.  Neurological: Negative for dizziness, tremors, weakness and light-headedness.  Hematological: Negative for adenopathy. Does not bruise/bleed easily.  Psychiatric/Behavioral: Negative for behavioral problems, confusion, sleep disturbance, dysphoric mood, decreased concentration and agitation.    Physical Exam   BP (!) 170/115   Pulse (!) 57   Temp 98.1 F (36.7 C) (Oral)   Wt 182 lb (82.6 kg)   BMI 30.29 kg/m   Physical Exam  Constitutional: He is oriented to person, place, and time. He appears well-developed and well-nourished. No distress.  HENT:  Mouth/Throat: Oropharynx is clear and moist. No oropharyngeal exudate.  Cardiovascular: Normal rate, regular rhythm and normal heart sounds. Exam  reveals no gallop and no friction rub.  No murmur heard.  Pulmonary/Chest: Effort normal and breath sounds normal. No respiratory distress. He has no wheezes.  Abdominal: Soft. Bowel sounds are normal. He exhibits no distension. There is no tenderness.   Lymphadenopathy:  He has no cervical adenopathy.  Neurological: He is alert and oriented to person, place, and time.  Skin: Skin is warm and dry. No rash noted. No erythema.  Psychiatric: He has a normal mood and affect. His behavior is normal.    Lab Results  Component Value Date   CD4TCELL 33 04/19/2018   Lab Results  Component Value Date   CD4TABS 630 04/19/2018   CD4TABS 610 10/06/2017   CD4TABS 590 04/05/2017   Lab Results  Component Value Date   HIV1RNAQUANT <20 NOT DETECTED 04/19/2018   Lab Results  Component Value Date   HEPBSAB NEG 05/24/2013   Lab Results  Component Value Date   LABRPR NON REAC 10/26/2016    CBC Lab Results  Component Value Date   WBC 6.1 04/19/2018   RBC 5.96 (H) 04/19/2018   HGB 17.1 04/19/2018   HCT 49.9 04/19/2018   PLT 105 (L) 04/19/2018   MCV 83.7 04/19/2018   MCH 28.7 04/19/2018   MCHC 34.3 04/19/2018   RDW 14.6 04/19/2018   LYMPHSABS 1,635 04/19/2018   MONOABS 495 04/05/2017   EOSABS 140 04/19/2018    BMET Lab Results  Component Value Date   NA 142 04/19/2018   K 3.8 04/19/2018   CL 106 04/19/2018   CO2 29 04/19/2018   GLUCOSE 80 04/19/2018   BUN 13 04/19/2018   CREATININE 1.51 (H) 04/19/2018   CALCIUM 9.2 04/19/2018   GFRNONAA 45 (L) 04/19/2018   GFRAA 52 (L) 04/19/2018      Assessment and Plan  hiv disease = well controlled. Will check his labs. 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

## 2018-05-10 ENCOUNTER — Telehealth: Payer: Self-pay | Admitting: Family Medicine

## 2018-05-10 NOTE — Telephone Encounter (Signed)
Called both pt numbers, left message.  Pt need diabetic follow up.

## 2018-05-22 ENCOUNTER — Other Ambulatory Visit: Payer: Self-pay | Admitting: Internal Medicine

## 2018-06-09 ENCOUNTER — Encounter: Payer: Self-pay | Admitting: Family Medicine

## 2018-06-09 ENCOUNTER — Ambulatory Visit (INDEPENDENT_AMBULATORY_CARE_PROVIDER_SITE_OTHER): Payer: MEDICARE | Admitting: Family Medicine

## 2018-06-09 VITALS — BP 152/90 | HR 70 | Temp 97.4°F | Wt 184.6 lb

## 2018-06-09 DIAGNOSIS — E119 Type 2 diabetes mellitus without complications: Secondary | ICD-10-CM

## 2018-06-09 DIAGNOSIS — I10 Essential (primary) hypertension: Secondary | ICD-10-CM

## 2018-06-09 DIAGNOSIS — B20 Human immunodeficiency virus [HIV] disease: Secondary | ICD-10-CM

## 2018-06-09 DIAGNOSIS — E785 Hyperlipidemia, unspecified: Secondary | ICD-10-CM

## 2018-06-09 DIAGNOSIS — E1159 Type 2 diabetes mellitus with other circulatory complications: Secondary | ICD-10-CM | POA: Diagnosis not present

## 2018-06-09 DIAGNOSIS — E1169 Type 2 diabetes mellitus with other specified complication: Secondary | ICD-10-CM | POA: Diagnosis not present

## 2018-06-09 DIAGNOSIS — I2510 Atherosclerotic heart disease of native coronary artery without angina pectoris: Secondary | ICD-10-CM | POA: Diagnosis not present

## 2018-06-09 LAB — POCT GLYCOSYLATED HEMOGLOBIN (HGB A1C): HEMOGLOBIN A1C: 6.1 % — AB (ref 4.0–5.6)

## 2018-06-09 NOTE — Progress Notes (Signed)
  Subjective:    Patient ID: Jeremy Reeves, male    DOB: [birth year], 77 y.o.   MRN: [id number]  Jeremy Reeves is a 77 y.o. male who presents for follow-up of Type 2 diabetes mellitus.  He does not complain of chest pain, shortness of breath, PND or DOE.  Patient is checking home blood sugars.   Home blood sugar records: meter record How often is blood sugars being checked: 1-2 x a week Current symptoms/problems include difficulty with maceration between the left great toe and second toe.  He does plan to see Dr. Paulla Dolly for this. Daily foot checks: yes   Any foot concerns: yes  Last eye exam: 2018 Exercise: walking qd yard work He continues on pravastatin without difficulty.  He also is taking Coreg as well as Niaspan.  He takes Prilosec regularly.  Continues on Triumeq.  He has had recent blood work concerning all these. The following portions of the patient's history were reviewed and updated as appropriate: allergies, current medications, past medical history, past social history and problem list.  ROS as in subjective above.     Objective:    Physical Exam Alert and in no distress macerated skin noted between the great toe and second toe.  The second toe does overlap the great toe.  Lab Review Diabetic Labs Latest Ref Rng & Units 04/19/2018 11/28/2017 10/06/2017 04/05/2017 10/26/2016  HbA1c - - 6.2% - - -  Microalbumin mg/L - - - - -  Micro/Creat Ratio - - - - - -  Chol 100 - 199 mg/dL - 125 - - 138  HDL >39 mg/dL - 45 - - 51  Calc LDL 0 - 99 mg/dL - 66 - - 74  Triglycerides 0 - 149 mg/dL - 72 - - 65  Creatinine 0.70 - 1.18 mg/dL 1.51(H) - 1.43(H) 1.44(H) 1.37(H)   BP/Weight 04/19/2018 11/28/2017 10/28/2017 05/20/6628 01/22/6545  Systolic BP 503 546 568 127 517  Diastolic BP 001 88 88 749 82  Wt. (Lbs) 182 184.4 184.2 183 184  BMI 30.29 30.69 28.42 27.83 28.82   Foot/eye exam completion dates Latest Ref Rng & Units 06/09/2015  Eye Exam No Retinopathy No Retinopathy  Foot Form  Completion - -  A1c is 6.1 [place]  reports that he has quit smoking. His smoking use included cigarettes. He has a 4.80 pack-year smoking history. He has never used smokeless tobacco. He reports that he does not drink alcohol or use drugs.     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 Rx changes: none 2. Education: Reviewed 'ABCs' of diabetes management (respective goals in parentheses):  A1C (<7), blood pressure (<130/80), and cholesterol (LDL <100). 3. Compliance at present is estimated to be good. Efforts to improve compliance (if necessary) will be directed at increased exercise. 4. Follow up: 4 months He will follow-up with Dr. Paulla Dolly concerning his feet.  Did recommend walking around without shoes as much as possible.

## 2018-06-09 NOTE — Addendum Note (Signed)
Addended by: Elyse Jarvis on: 06/09/2018 03:32 PM   Modules accepted: Orders

## 2018-06-09 NOTE — Patient Instructions (Signed)
Try taking the Prilosec every other day and see if it controls her symptoms

## 2018-06-12 ENCOUNTER — Other Ambulatory Visit: Payer: Self-pay | Admitting: Cardiovascular Disease

## 2018-06-13 NOTE — Telephone Encounter (Signed)
Rx sent to pharmacy   

## 2018-07-18 ENCOUNTER — Other Ambulatory Visit (INDEPENDENT_AMBULATORY_CARE_PROVIDER_SITE_OTHER): Payer: MEDICARE

## 2018-07-18 DIAGNOSIS — Z23 Encounter for immunization: Secondary | ICD-10-CM

## 2018-10-12 ENCOUNTER — Other Ambulatory Visit: Payer: Self-pay | Admitting: Internal Medicine

## 2018-10-12 DIAGNOSIS — E1169 Type 2 diabetes mellitus with other specified complication: Secondary | ICD-10-CM

## 2018-10-12 DIAGNOSIS — E785 Hyperlipidemia, unspecified: Principal | ICD-10-CM

## 2018-10-17 ENCOUNTER — Ambulatory Visit: Payer: MEDICARE | Admitting: Family Medicine

## 2018-10-20 ENCOUNTER — Encounter: Payer: Self-pay | Admitting: Family Medicine

## 2018-10-20 ENCOUNTER — Ambulatory Visit (INDEPENDENT_AMBULATORY_CARE_PROVIDER_SITE_OTHER): Payer: MEDICARE | Admitting: Family Medicine

## 2018-10-20 VITALS — BP 128/86 | HR 71 | Temp 97.7°F | Wt 185.0 lb

## 2018-10-20 DIAGNOSIS — E1169 Type 2 diabetes mellitus with other specified complication: Secondary | ICD-10-CM | POA: Diagnosis not present

## 2018-10-20 DIAGNOSIS — E785 Hyperlipidemia, unspecified: Secondary | ICD-10-CM | POA: Diagnosis not present

## 2018-10-20 DIAGNOSIS — I152 Hypertension secondary to endocrine disorders: Secondary | ICD-10-CM

## 2018-10-20 DIAGNOSIS — K219 Gastro-esophageal reflux disease without esophagitis: Secondary | ICD-10-CM

## 2018-10-20 DIAGNOSIS — E1159 Type 2 diabetes mellitus with other circulatory complications: Secondary | ICD-10-CM

## 2018-10-20 DIAGNOSIS — I10 Essential (primary) hypertension: Secondary | ICD-10-CM | POA: Diagnosis not present

## 2018-10-20 DIAGNOSIS — B20 Human immunodeficiency virus [HIV] disease: Secondary | ICD-10-CM

## 2018-10-20 DIAGNOSIS — E119 Type 2 diabetes mellitus without complications: Secondary | ICD-10-CM | POA: Diagnosis not present

## 2018-10-20 LAB — POCT GLYCOSYLATED HEMOGLOBIN (HGB A1C): HEMOGLOBIN A1C: 6.4 % — AB (ref 4.0–5.6)

## 2018-10-20 LAB — LIPID PANEL
CHOLESTEROL TOTAL: 159 mg/dL (ref 100–199)
Chol/HDL Ratio: 2.9 ratio (ref 0.0–5.0)
HDL: 55 mg/dL (ref >39)
LDL CALC: 90 mg/dL (ref 0–99)
Triglycerides: 69 mg/dL (ref 0–149)
VLDL CHOLESTEROL CAL: 14 mg/dL (ref 5–40)

## 2018-10-20 NOTE — Progress Notes (Signed)
  Subjective:    Patient ID: Jeremy Reeves, male    DOB: 01/16/1941, 78 y.o.   MRN: [id number]  Jeremy Reeves is a 78 y.o. male who presents for follow-up of Type 2 diabetes mellitus.  Patient is checking home blood sugars.   Home blood sugar records: writes it down but did not bring them in. How often is blood sugars being checked: once a week fasting unknown readings Current symptoms/problems include none at this time. Daily foot checks: yes   Any foot concerns: right foot numb Last eye exam: over  A year Exercise: walking 2-3 times per week He continues on pravastatin and niacin without difficulty.  He is taking Coreg and lisinopril.  Continues on Triumeq and is scheduled to follow-up with ID within the next week.  He does not complain of any reflux symptoms. The following portions of the patient's history were reviewed and updated as appropriate: allergies, current medications, past medical history, past social history and problem list.  ROS as in subjective above.     Objective:    Physical Exam Alert and in no distress foot exam does show some dryness and thickening of the nails..   Lab Review Diabetic Labs Latest Ref Rng & Units 06/09/2018 04/19/2018 11/28/2017 10/06/2017 04/05/2017  HbA1c 4.0 - 5.6 % 6.1(A) - 6.2% - -  Microalbumin mg/L - - - - -  Micro/Creat Ratio - - - - - -  Chol 100 - 199 mg/dL - - 125 - -  HDL >39 mg/dL - - 45 - -  Calc LDL 0 - 99 mg/dL - - 66 - -  Triglycerides 0 - 149 mg/dL - - 72 - -  Creatinine 0.70 - 1.18 mg/dL - 1.51(H) - 1.43(H) 1.44(H)   BP/Weight 06/09/2018 04/19/2018 11/28/2017 9/37/1696 04/24/9380  Systolic BP 017 510 258 527 782  Diastolic BP 90 423 88 88 536  Wt. (Lbs) 184.6 182 184.4 184.2 183  BMI 30.72 30.29 30.69 28.42 27.83   Foot/eye exam completion dates Latest Ref Rng & Units 06/09/2015  Eye Exam No Retinopathy No Retinopathy  Foot Form Completion - -  A1c is 6.4  Jeremy Reeves  reports that he has quit smoking. His smoking use  included cigarettes. He has a 4.80 pack-year smoking history. He has never used smokeless tobacco. He reports that he does not drink alcohol or use drugs.     Assessment & Plan:    Type 2 diabetes mellitus without complication, without long-term current use of insulin (HCC)  Hyperlipidemia associated with type 2 diabetes mellitus ([place]) - Plan: Lipid panel, POCT glycosylated hemoglobin (Hb A1C)  Hypertension associated with diabetes (HCC)  HIV disease (HCC)  Gastroesophageal reflux disease, esophagitis presence not specified    1. Rx changes: none 2. Education: Reviewed 'ABCs' of diabetes management (respective goals in parentheses):  A1C (<7), blood pressure (<130/80), and cholesterol (LDL <100). 3. Compliance at present is estimated to be good. Efforts to improve compliance (if necessary) will be directed at increased exercise. 4. Follow up: 4 months He will also follow-up with infectious disease.  Recheck here in 4 months.

## 2018-10-23 ENCOUNTER — Telehealth: Payer: Self-pay | Admitting: *Deleted

## 2018-10-23 ENCOUNTER — Ambulatory Visit (INDEPENDENT_AMBULATORY_CARE_PROVIDER_SITE_OTHER): Payer: MEDICARE | Admitting: Internal Medicine

## 2018-10-23 ENCOUNTER — Encounter: Payer: Self-pay | Admitting: Internal Medicine

## 2018-10-23 ENCOUNTER — Other Ambulatory Visit: Payer: Self-pay | Admitting: Internal Medicine

## 2018-10-23 VITALS — BP 171/119 | HR 89 | Temp 97.6°F | Wt 188.0 lb

## 2018-10-23 DIAGNOSIS — Z79899 Other long term (current) drug therapy: Secondary | ICD-10-CM

## 2018-10-23 DIAGNOSIS — I10 Essential (primary) hypertension: Secondary | ICD-10-CM

## 2018-10-23 DIAGNOSIS — N183 Chronic kidney disease, stage 3 unspecified: Secondary | ICD-10-CM

## 2018-10-23 DIAGNOSIS — B20 Human immunodeficiency virus [HIV] disease: Secondary | ICD-10-CM

## 2018-10-23 MED ORDER — ABACAVIR-DOLUTEGRAVIR-LAMIVUD 600-50-300 MG PO TABS: 1.0000 | ORAL_TABLET | Freq: Every day | ORAL | 0 refills | Status: DC

## 2018-10-23 MED ORDER — ABACAVIR-DOLUTEGRAVIR-LAMIVUD 600-50-300 MG PO TABS: 1.0000 | ORAL_TABLET | Freq: Every day | ORAL | 11 refills | Status: DC

## 2018-10-23 NOTE — Progress Notes (Signed)
RFV: follow up for hiv disease  Patient ID: Jeremy Reeves, male   DOB: 19-Apr-1941, 78 y.o.   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 Doing well. No recent health problems. Went to see his pcp last week, no changes in his meds. Needs triumeq refill. He continues to exercise daily  Outpatient Encounter Medications as of 10/23/2018  Medication Sig  . carvedilol (COREG) 6.25 MG tablet TAKE 1 TABLET TWICE DAILY WITH A MEAL  . Cinnamon 500 MG capsule Take 1,000 mg by mouth daily.  . Cranberry 360 MG CAPS Take by mouth.  . Multiple Vitamins-Minerals (MULTIVITAMIN WITH MINERALS) tablet Take 1 tablet by mouth daily.    . niacin (NIASPAN) 1000 MG CR tablet TAKE 2 TABLETS EVERY DAY AT BEDTIME  . omeprazole (PRILOSEC) 20 MG capsule TAKE 1 CAPSULE EVERY DAY  . pravastatin (PRAVACHOL) 40 MG tablet TAKE 1 TABLET EVERY DAY  . TRIUMEQ 600-50-300 MG tablet TAKE 1 TABLET BY MOUTH DAILY  . vitamin E 400 UNIT capsule Take 400 Units by mouth daily.    No facility-administered encounter medications on file as of 10/23/2018.      Patient Active Problem List   Diagnosis Date Noted  . Postherpetic neuralgia 04/16/2015  . GERD (gastroesophageal reflux disease) 07/05/2013  . HIV disease ([place]) 05/24/2013  . Hypertension associated with diabetes ([place]) 01/25/2013  . ASHD (arteriosclerotic heart disease) 03/18/2011  . BPH (benign prostatic hyperplasia) 03/18/2011  . Hyperlipidemia associated with type 2 diabetes mellitus ([place]) 03/18/2011  . Diabetes mellitus ([place]) 03/18/2011     Health Maintenance Due  Topic Date Due  . OPHTHALMOLOGY EXAM  06/08/2016  . URINE MICROALBUMIN  08/11/2016    Social History   Tobacco Use  . Smoking status: Former Smoker    Packs/day: 0.12    Years: 40.00    Pack years: 4.80    Types: Cigarettes  . Smokeless tobacco: Never Used  . Tobacco comment:  07/04/2013 "I quit smoking before 2000"  Substance Use Topics  . Alcohol use: No  . Drug use: No   Review of Systems  Constitutional: Negative for fever, chills, diaphoresis, activity change, appetite change, fatigue and unexpected weight change.  HENT: Negative for congestion, sore throat, rhinorrhea, sneezing, trouble swallowing and sinus pressure.  Eyes: Negative for photophobia and visual disturbance.  Respiratory: Negative for cough, chest tightness, shortness of breath, wheezing and stridor.  Cardiovascular: Negative for chest pain, palpitations and leg swelling.  Gastrointestinal: Negative for nausea, vomiting, abdominal pain, diarrhea, constipation, blood in stool, abdominal distention and anal bleeding.  Genitourinary: Negative for dysuria, hematuria, flank pain and difficulty urinating.  Musculoskeletal: Negative for myalgias, back pain, joint swelling, arthralgias and gait problem.  Skin: Negative for color change, pallor, rash and wound.  Neurological: Numbness to right foot- unchanged Hematological: Negative for adenopathy. Does not bruise/bleed easily.  Psychiatric/Behavioral: Negative for behavioral problems, confusion, sleep disturbance, dysphoric mood, decreased concentration and agitation.    Physical Exam   BP (!) 171/119   Pulse 89   Temp 97.6 F (36.4 C)   Wt 188 lb (85.3 kg)   BMI 31.28 kg/m   Constitutional:  oriented to person, place, and time. appears well-developed and well-nourished. No distress.  HENT: [place]/AT, PERRLA, no scleral icterus Mouth/Throat: Oropharynx is clear and moist. No oropharyngeal exudate.  Cardiovascular: Normal rate, regular rhythm and normal heart sounds.  Exam reveals no gallop and no friction rub.  No murmur heard.  Pulmonary/Chest: Effort normal and breath sounds normal. No respiratory distress.  has no wheezes.  Neck = supple, no nuchal rigidity Abdominal: Soft. Bowel sounds are normal.  exhibits no distension. There is no  tenderness.  Lymphadenopathy: no cervical adenopathy. No axillary adenopathy Neurological: alert and oriented to person, place, and time.  Skin: Skin is warm and dry. No rash noted. No erythema.  Psychiatric: a normal mood and affect.  behavior is normal.  Lab Results  Component Value Date   CD4TCELL 33 04/19/2018   Lab Results  Component Value Date   CD4TABS 630 04/19/2018   CD4TABS 610 10/06/2017   CD4TABS 590 04/05/2017   Lab Results  Component Value Date   HIV1RNAQUANT <20 NOT DETECTED 04/19/2018   Lab Results  Component Value Date   HEPBSAB NEG 05/24/2013   Lab Results  Component Value Date   LABRPR NON REAC 10/26/2016    CBC Lab Results  Component Value Date   WBC 6.1 04/19/2018   RBC 5.96 (H) 04/19/2018   HGB 17.1 04/19/2018   HCT 49.9 04/19/2018   PLT 105 (L) 04/19/2018   MCV 83.7 04/19/2018   MCH 28.7 04/19/2018   MCHC 34.3 04/19/2018   RDW 14.6 04/19/2018   LYMPHSABS 1,635 04/19/2018   MONOABS 495 04/05/2017   EOSABS 140 04/19/2018    BMET Lab Results  Component Value Date   NA 142 04/19/2018   K 3.8 04/19/2018   CL 106 04/19/2018   CO2 29 04/19/2018   GLUCOSE 80 04/19/2018   BUN 13 04/19/2018   CREATININE 1.51 (H) 04/19/2018   CALCIUM 9.2 04/19/2018   GFRNONAA 45 (L) 04/19/2018   GFRAA 52 (L) 04/19/2018      Assessment and Plan  hiv disease= continue on triumeq. Will give refills. Get viral load, cd 4 ct  ckd 3 = will check cr to see that it is stable  Long term medication mgmt = will check cr to see if hiv meds need to adjusted  HTN = not at goal. Will have him refer to primary care.

## 2018-10-23 NOTE — Telephone Encounter (Signed)
Patient requested [facility] day supply sent to local [facility]. Done. Landis Gandy, RN

## 2018-10-24 LAB — T-HELPER CELL (CD4) - (RCID CLINIC ONLY)
CD4 % Helper T Cell: 31 % — ABNORMAL LOW (ref 33–55)
CD4 T Cell Abs: 520 /uL (ref 400–2700)

## 2018-10-25 LAB — COMPLETE METABOLIC PANEL WITH GFR
AG Ratio: 1.4 (calc) (ref 1.0–2.5)
ALT: 37 U/L (ref 9–46)
AST: 34 U/L (ref 10–35)
Albumin: 4.2 g/dL (ref 3.6–5.1)
Alkaline phosphatase (APISO): 57 U/L (ref 40–115)
BUN/Creatinine Ratio: 7 (calc) (ref 6–22)
BUN: 11 mg/dL (ref 7–25)
CO2: 28 mmol/L (ref 20–32)
Calcium: 9.4 mg/dL (ref 8.6–10.3)
Chloride: 104 mmol/L (ref 98–110)
Creat: 1.52 mg/dL — ABNORMAL HIGH (ref 0.70–1.18)
GFR, EST NON AFRICAN AMERICAN: 44 mL/min/{1.73_m2} — AB (ref >=60)
GFR, Est African American: 51 mL/min/{1.73_m2} — ABNORMAL LOW (ref >=60)
Globulin: 2.9 g/dL (calc) (ref 1.9–3.7)
Glucose, Bld: 107 mg/dL — ABNORMAL HIGH (ref 65–99)
Potassium: 4.2 mmol/L (ref 3.5–5.3)
Sodium: 140 mmol/L (ref 135–146)
Total Bilirubin: 0.5 mg/dL (ref 0.2–1.2)
Total Protein: 7.1 g/dL (ref 6.1–8.1)

## 2018-10-25 LAB — CBC WITH DIFFERENTIAL/PLATELET
Absolute Monocytes: 592 cells/uL (ref 200–950)
Basophils Absolute: 50 cells/uL (ref 0–200)
Basophils Relative: 0.8 %
Eosinophils Absolute: 284 cells/uL (ref 15–500)
Eosinophils Relative: 4.5 %
HCT: 53.7 % — ABNORMAL HIGH (ref 38.5–50.0)
Hemoglobin: 17.5 g/dL — ABNORMAL HIGH (ref 13.2–17.1)
Lymphs Abs: 1562 cells/uL (ref 850–3900)
MCH: 27.7 pg (ref 27.0–33.0)
MCHC: 32.6 g/dL (ref 32.0–36.0)
MCV: 85 fL (ref 80.0–100.0)
MPV: 12.5 fL (ref 7.5–12.5)
Monocytes Relative: 9.4 %
NEUTROS ABS: 3812 {cells}/uL (ref 1500–7800)
Neutrophils Relative %: 60.5 %
Platelets: 110 10*3/uL — ABNORMAL LOW (ref 140–400)
RBC: 6.32 10*6/uL — ABNORMAL HIGH (ref 4.20–5.80)
RDW: 14.7 % (ref 11.0–15.0)
Total Lymphocyte: 24.8 %
WBC: 6.3 10*3/uL (ref 3.8–10.8)

## 2018-10-25 LAB — HIV-1 RNA QUANT-NO REFLEX-BLD
HIV 1 RNA Quant: <20 copies/mL
HIV-1 RNA Quant, Log: <1.3 Log copies/mL

## 2018-10-30 ENCOUNTER — Other Ambulatory Visit: Payer: Self-pay | Admitting: Cardiovascular Disease

## 2018-11-13 ENCOUNTER — Other Ambulatory Visit: Payer: Self-pay | Admitting: Family Medicine

## 2018-11-13 DIAGNOSIS — E1159 Type 2 diabetes mellitus with other circulatory complications: Secondary | ICD-10-CM

## 2018-11-13 DIAGNOSIS — I10 Essential (primary) hypertension: Principal | ICD-10-CM

## 2018-11-24 ENCOUNTER — Other Ambulatory Visit: Payer: Self-pay | Admitting: Cardiovascular Disease

## 2018-12-06 ENCOUNTER — Other Ambulatory Visit: Payer: Self-pay | Admitting: Cardiovascular Disease

## 2018-12-20 ENCOUNTER — Other Ambulatory Visit: Payer: Self-pay | Admitting: Cardiovascular Disease

## 2019-01-10 ENCOUNTER — Other Ambulatory Visit: Payer: Self-pay | Admitting: Cardiovascular Disease

## 2019-01-29 ENCOUNTER — Telehealth: Payer: Self-pay

## 2019-01-29 NOTE — Telephone Encounter (Signed)
Pt was called to advise of the canceled cologuard. Pt was advised that he would need to call to get it reordered. [place]

## 2019-01-30 ENCOUNTER — Encounter: Payer: Self-pay | Admitting: Family Medicine

## 2019-01-30 ENCOUNTER — Telehealth: Payer: Self-pay

## 2019-01-30 NOTE — Telephone Encounter (Signed)
Pt was advised [facility] 

## 2019-01-30 NOTE — Telephone Encounter (Signed)
If he truly had it in 2012 then he does not need one for a couple of years

## 2019-01-30 NOTE — Telephone Encounter (Signed)
Pt called and asked if he still need to have cologuard . Pt last coloscopy was 2012. Please advise [facility]

## 2019-02-20 ENCOUNTER — Ambulatory Visit: Payer: MEDICARE | Admitting: Family Medicine

## 2019-03-01 ENCOUNTER — Ambulatory Visit: Payer: Self-pay | Admitting: Family Medicine

## 2019-03-07 ENCOUNTER — Other Ambulatory Visit: Payer: Self-pay | Admitting: Cardiovascular Disease

## 2019-03-22 ENCOUNTER — Other Ambulatory Visit: Payer: Self-pay | Admitting: Internal Medicine

## 2019-03-22 DIAGNOSIS — E785 Hyperlipidemia, unspecified: Secondary | ICD-10-CM

## 2019-03-22 DIAGNOSIS — E1169 Type 2 diabetes mellitus with other specified complication: Secondary | ICD-10-CM

## 2019-04-23 ENCOUNTER — Ambulatory Visit: Payer: MEDICARE | Admitting: Internal Medicine

## 2019-05-14 ENCOUNTER — Other Ambulatory Visit: Payer: Self-pay | Admitting: Cardiovascular Disease

## 2019-06-27 ENCOUNTER — Other Ambulatory Visit: Payer: Self-pay | Admitting: Cardiovascular Disease

## 2019-07-05 ENCOUNTER — Other Ambulatory Visit: Payer: Self-pay | Admitting: Cardiovascular Disease

## 2019-07-09 ENCOUNTER — Telehealth: Payer: Self-pay | Admitting: Pharmacy Technician

## 2019-07-09 NOTE — Telephone Encounter (Signed)
[facility] Patient Advocate Encounter   I was successful in securing patient a $7500 grant from Patient Jeremy Reeves (PAF) to provide copayment coverage for Triumeq. This will make the out of pocket cost $0.     I have spoken with the patient and provided them the billing information for PAF. He is good until 07/08/2020    The billing information is as follows and has been shared with [employer]:  RxBin: [id number] PCN: [id number] Member ID: [id number] Group ID: [id number] Dates of Eligibility: 07/09/2019 through 07/08/2020  Patient knows to call the office with questions or concerns.  Jeremy Reeves, CPhT Specialty Pharmacy Patient [facility] Phone: [phone] Fax: [phone] 07/09/2019 9:05 AM

## 2019-07-11 NOTE — Telephone Encounter (Signed)
Danielle, advocate at [facility], called for confirmation of diagnosis and date.  RN returned the call, it went to voicemail. Will 'cc Christie for follow up. Landis Gandy, RN

## 2019-07-12 NOTE — Telephone Encounter (Signed)
Thanks for the notification, I will upload this information onto the provider portal for this patient.

## 2019-07-20 DIAGNOSIS — Z23 Encounter for immunization: Secondary | ICD-10-CM | POA: Diagnosis not present

## 2019-08-01 ENCOUNTER — Other Ambulatory Visit: Payer: Self-pay | Admitting: Internal Medicine

## 2019-08-01 DIAGNOSIS — E1169 Type 2 diabetes mellitus with other specified complication: Secondary | ICD-10-CM

## 2019-08-01 DIAGNOSIS — E785 Hyperlipidemia, unspecified: Secondary | ICD-10-CM

## 2019-08-02 ENCOUNTER — Encounter: Payer: Self-pay | Admitting: Cardiovascular Disease

## 2019-08-02 ENCOUNTER — Other Ambulatory Visit: Payer: Self-pay

## 2019-08-02 ENCOUNTER — Ambulatory Visit (INDEPENDENT_AMBULATORY_CARE_PROVIDER_SITE_OTHER): Payer: MEDICARE | Admitting: Cardiovascular Disease

## 2019-08-02 DIAGNOSIS — E785 Hyperlipidemia, unspecified: Secondary | ICD-10-CM | POA: Diagnosis not present

## 2019-08-02 DIAGNOSIS — I4811 Longstanding persistent atrial fibrillation: Secondary | ICD-10-CM | POA: Diagnosis not present

## 2019-08-02 DIAGNOSIS — I4891 Unspecified atrial fibrillation: Secondary | ICD-10-CM | POA: Insufficient documentation

## 2019-08-02 DIAGNOSIS — I10 Essential (primary) hypertension: Secondary | ICD-10-CM | POA: Diagnosis not present

## 2019-08-02 DIAGNOSIS — E1169 Type 2 diabetes mellitus with other specified complication: Secondary | ICD-10-CM | POA: Diagnosis not present

## 2019-08-02 DIAGNOSIS — E1159 Type 2 diabetes mellitus with other circulatory complications: Secondary | ICD-10-CM

## 2019-08-02 DIAGNOSIS — I152 Hypertension secondary to endocrine disorders: Secondary | ICD-10-CM

## 2019-08-02 DIAGNOSIS — I2510 Atherosclerotic heart disease of native coronary artery without angina pectoris: Secondary | ICD-10-CM

## 2019-08-02 DIAGNOSIS — I4819 Other persistent atrial fibrillation: Secondary | ICD-10-CM | POA: Insufficient documentation

## 2019-08-02 MED ORDER — APIXABAN 5 MG PO TABS: 5.0000 mg | ORAL_TABLET | Freq: Two times a day (BID) | ORAL | 3 refills | Status: DC

## 2019-08-02 NOTE — Progress Notes (Signed)
08/02/2019 Jeremy Reeves   1941-04-22  [id number]  Primary Physician Denita Lung, MD Primary Cardiologist: Lorretta Harp MD FACP, [place], [place], [place]  HPI:  Jeremy Reeves is a 78 y.o.  moderately overweight, married [place] American male, father of 2, grandfather to 3 grandchildren who I last saw in the office  10/28/2017.Marland Kitchen He has a history of ischemic heart disease status post coronary artery bypass grafting in 1999 with a LIMA to his LAD, a vein to an obtuse marginal branch and to the RCA. Myoview performed February 2013 was nonischemic. He denies chest pain or shortness of breath.  Since I saw him almost 2 years ago he continues to do well.  Denies chest pain or shortness of breath.  His EKG today does show new onset atrial flutter with a variable ventricular response.   Current Meds  Medication Sig  . abacavir-dolutegravir-lamiVUDine (TRIUMEQ) 600-50-300 MG tablet Take 1 tablet by mouth daily.  . carvedilol (COREG) 6.25 MG tablet TAKE 1 TABLET TWICE DAILY WITH A MEAL  . Cinnamon 500 MG capsule Take 1,000 mg by mouth daily.  . Cranberry 360 MG CAPS Take by mouth.  . Multiple Vitamins-Minerals (MULTIVITAMIN WITH MINERALS) tablet Take 1 tablet by mouth daily.    . niacin (NIASPAN) 1000 MG CR tablet Take 2 tablets (2,000 mg total) by mouth at bedtime.  Marland Kitchen omeprazole (PRILOSEC) 20 MG capsule TAKE 1 CAPSULE EVERY DAY  . pravastatin (PRAVACHOL) 40 MG tablet TAKE 1 TABLET EVERY DAY  . vitamin E 400 UNIT capsule Take 400 Units by mouth daily.      Allergies  Allergen Reactions  . Peanut-Containing Drug Products Swelling    Social History   Socioeconomic History  . Marital status: Married    Spouse name: Not on file  . Number of children: Not on file  . Years of education: Not on file  . Highest education level: Not on file  Occupational History  . Not on file  Social Needs  . Financial resource strain: Not on file  . Food insecurity    Worry: Not on file   Inability: Not on file  . Transportation needs    Medical: Not on file    Non-medical: Not on file  Tobacco Use  . Smoking status: Former Smoker    Packs/day: 0.12    Years: 40.00    Pack years: 4.80    Types: Cigarettes  . Smokeless tobacco: Never Used  . Tobacco comment: 07/04/2013 "I quit smoking before 2000"  Substance and Sexual Activity  . Alcohol use: No  . Drug use: No  . Sexual activity: Not Currently    Partners: Female    Comment: declined condoms  Lifestyle  . Physical activity    Days per week: Not on file    Minutes per session: Not on file  . Stress: Not on file  Relationships  . Social Herbalist on phone: Not on file    Gets together: Not on file    Attends religious service: Not on file    Active member of club or organization: Not on file    Attends meetings of clubs or organizations: Not on file    Relationship status: Not on file  . Intimate partner violence    Fear of current or ex partner: Not on file    Emotionally abused: Not on file    Physically abused: Not on file    Forced sexual activity: Not  on file  Other Topics Concern  . Not on file  Social History Narrative  . Not on file     Review of Systems: General: negative for chills, fever, night sweats or weight changes.  Cardiovascular: negative for chest pain, dyspnea on exertion, edema, orthopnea, palpitations, paroxysmal nocturnal dyspnea or shortness of breath Dermatological: negative for rash Respiratory: negative for cough or wheezing Urologic: negative for hematuria Abdominal: negative for nausea, vomiting, diarrhea, bright red blood per rectum, melena, or hematemesis Neurologic: negative for visual changes, syncope, or dizziness All other systems reviewed and are otherwise negative except as noted above.    Blood pressure (!) 172/100, pulse 62, temperature (!) 97.2 F (36.2 C), temperature source Temporal, height 5\' 6"  (1.676 m), weight 170 lb (77.1 kg), SpO2 98 %.   General appearance: alert and no distress Neck: no adenopathy, no carotid bruit, no JVD, supple, symmetrical, trachea midline and thyroid not enlarged, symmetric, no tenderness/mass/nodules Lungs: clear to auscultation bilaterally Heart: irregularly irregular rhythm Extremities: extremities normal, atraumatic, no cyanosis or edema Pulses: 2+ and symmetric Skin: Skin color, texture, turgor normal. No rashes or lesions Neurologic: Alert and oriented X 3, normal strength and tone. Normal symmetric reflexes. Normal coordination and gait  EKG atrial flutter with ventricular spots of 80, right bundle branch block and left anterior fascicular block.  I personally reviewed this EKG.  ASSESSMENT AND PLAN:   Hyperlipidemia associated with type 2 diabetes mellitus History of hyperlipidemia on statin therapy with lipid profile performed 10/20/2018 revealing total cholesterol of 59, LDL of 90 and HDL of 55.  Hypertension associated with diabetes History of essential hypertension blood pressure measured today at 172/100.  He is on carvedilol.  He says at home his blood pressure is much better than this.  ASHD (arteriosclerotic heart disease) History of coronary artery disease status post CABG in 1999 with a LIMA to his LAD, vein to marginal branch and to the RCA.  His last Myoview performed February 2013 was nonischemic.  He denies chest pain or shortness of breath.  Atrial fibrillation ([place]) Newly diagnosed atrial fibrillation with a CHA2DS2-VASc score of 3.  Based on this I am going to begin him on a novel oral anticoagulant.      Lorretta Harp MD FACP,FACC,FAHA, [facility] 08/02/2019 12:09 PM

## 2019-08-02 NOTE — Patient Instructions (Addendum)
Medication Instructions:  Your physician has recommended you make the following change in your medication:   START APIXABAN (ELQUIS) 5 MG BY MOUTH TWICE A DAY  If you need a refill on your cardiac medications before your next appointment, please call your pharmacy.   Lab work: Your physician recommends that you return for lab work WITHIN 1 WEEK: [place] If you have labs (blood work) drawn today and your tests are completely normal, you will receive your results only by: Marland Kitchen MyChart Message (if you have MyChart) OR . A paper copy in the mail If you have any lab test that is abnormal or we need to change your treatment, we will call you to review the results.  Testing/Procedures: NONE  Follow-Up: At [facility], you and your health needs are our priority.  As part of our continuing mission to provide you with exceptional heart care, we have created designated Provider Care Teams.  These Care Teams include your primary Cardiologist (physician) and Advanced Practice Providers (APPs -  Physician Assistants and Nurse Practitioners) who all work together to provide you with the care you need, when you need it. . You will need a follow up appointment in 12 months with Dr. Quay Burow.  Please call our office 2 months in advance to schedule this appointment.   ADDITIONAL INFORMATION:  Please contact our office at [phone] regarding issues with the cost of your Eliquis

## 2019-08-02 NOTE — Assessment & Plan Note (Signed)
History of coronary artery disease status post CABG in 1999 with a LIMA to his LAD, vein to marginal branch and to the RCA.  His last Myoview performed February 2013 was nonischemic.  He denies chest pain or shortness of breath.

## 2019-08-02 NOTE — Assessment & Plan Note (Signed)
Newly diagnosed atrial fibrillation with a CHA2DS2-VASc score of 3.  Based on this I am going to begin him on a novel oral anticoagulant.

## 2019-08-02 NOTE — Assessment & Plan Note (Signed)
History of essential hypertension blood pressure measured today at 172/100.  He is on carvedilol.  He says at home his blood pressure is much better than this.

## 2019-08-02 NOTE — Assessment & Plan Note (Signed)
History of hyperlipidemia on statin therapy with lipid profile performed 10/20/2018 revealing total cholesterol of 59, LDL of 90 and HDL of 55.

## 2019-08-03 LAB — BASIC METABOLIC PANEL
BUN/Creatinine Ratio: 10 (ref 10–24)
BUN: 15 mg/dL (ref 8–27)
CO2: 25 mmol/L (ref 20–29)
Calcium: 9.2 mg/dL (ref 8.6–10.2)
Chloride: 102 mmol/L (ref 96–106)
Creatinine, Ser: 1.51 mg/dL — ABNORMAL HIGH (ref 0.76–1.27)
GFR calc Af Amer: 51 mL/min/{1.73_m2} — ABNORMAL LOW (ref >59)
GFR calc non Af Amer: 44 mL/min/{1.73_m2} — ABNORMAL LOW (ref >59)
Glucose: 82 mg/dL (ref 65–99)
Potassium: 4.2 mmol/L (ref 3.5–5.2)
Sodium: 142 mmol/L (ref 134–144)

## 2019-08-06 ENCOUNTER — Telehealth: Payer: Self-pay | Admitting: Cardiovascular Disease

## 2019-08-06 MED ORDER — APIXABAN 5 MG PO TABS: 5.0000 mg | ORAL_TABLET | Freq: Two times a day (BID) | ORAL | 0 refills | Status: DC

## 2019-08-06 NOTE — Telephone Encounter (Signed)
New Message    Pt c/o medication issue:  1. Name of Medication: Eliquis  2. How are you currently taking this medication (dosage and times per day)? 1 tablet by mouth two times daily   3. Are you having a reaction (difficulty breathing--STAT)? No  4. What is your medication issue? Patient having issue paying for medication wants to see if the could get assistance.

## 2019-08-06 NOTE — Telephone Encounter (Signed)
Spoke to patient's wife she stated she called [employer] and everything taken care of.Stated he has a 30 day free card for Eliquis.Advised I will send in a 30 day supply to [facility] on [address].

## 2019-08-10 DIAGNOSIS — E1169 Type 2 diabetes mellitus with other specified complication: Secondary | ICD-10-CM | POA: Diagnosis not present

## 2019-08-10 DIAGNOSIS — E785 Hyperlipidemia, unspecified: Secondary | ICD-10-CM | POA: Diagnosis not present

## 2019-08-10 LAB — HEPATIC FUNCTION PANEL
ALT: 90 IU/L — ABNORMAL HIGH (ref 0–44)
AST: 59 IU/L — ABNORMAL HIGH (ref 0–40)
Albumin: 3.9 g/dL (ref 3.7–4.7)
Alkaline Phosphatase: 81 IU/L (ref 39–117)
Bilirubin Total: 0.7 mg/dL (ref 0.0–1.2)
Bilirubin, Direct: 0.28 mg/dL (ref 0.00–0.40)
Total Protein: 6.6 g/dL (ref 6.0–8.5)

## 2019-08-10 LAB — LIPID PANEL
Chol/HDL Ratio: 2.3 ratio (ref 0.0–5.0)
Cholesterol, Total: 117 mg/dL (ref 100–199)
HDL: 52 mg/dL (ref >39)
LDL Chol Calc (NIH): 55 mg/dL (ref 0–99)
Triglycerides: 40 mg/dL (ref 0–149)
VLDL Cholesterol Cal: 10 mg/dL (ref 5–40)

## 2019-08-13 ENCOUNTER — Telehealth: Payer: Self-pay | Admitting: *Deleted

## 2019-08-13 DIAGNOSIS — R7989 Other specified abnormal findings of blood chemistry: Secondary | ICD-10-CM

## 2019-08-13 NOTE — Telephone Encounter (Addendum)
Spoke with pt, aware of results and need for repeat labs. Lab orders mailed to the pt   ----- Message from Lorretta Harp, MD sent at 08/12/2019 12:24 PM EDT ----- FLP excellent. Transaminases elevated. Repeat LFTs 3 months

## 2019-08-27 ENCOUNTER — Ambulatory Visit (INDEPENDENT_AMBULATORY_CARE_PROVIDER_SITE_OTHER): Payer: MEDICARE | Admitting: Internal Medicine

## 2019-08-27 ENCOUNTER — Other Ambulatory Visit: Payer: Self-pay

## 2019-08-27 ENCOUNTER — Encounter: Payer: Self-pay | Admitting: Internal Medicine

## 2019-08-27 VITALS — BP 180/104 | HR 66 | Wt 173.0 lb

## 2019-08-27 DIAGNOSIS — N183 Chronic kidney disease, stage 3 unspecified: Secondary | ICD-10-CM

## 2019-08-27 DIAGNOSIS — B20 Human immunodeficiency virus [HIV] disease: Secondary | ICD-10-CM | POA: Diagnosis not present

## 2019-08-27 DIAGNOSIS — Z79899 Other long term (current) drug therapy: Secondary | ICD-10-CM | POA: Diagnosis not present

## 2019-08-27 DIAGNOSIS — I2510 Atherosclerotic heart disease of native coronary artery without angina pectoris: Secondary | ICD-10-CM

## 2019-08-27 DIAGNOSIS — I10 Essential (primary) hypertension: Secondary | ICD-10-CM

## 2019-08-27 NOTE — Progress Notes (Signed)
Patient ID: Jeremy Reeves, male   DOB: [birth year], 78 y.o.   MRN: [id number]  HPI Jeremy Reeves is a 78 yo M with well controlled HIV disease, CD 4 count 520/VL<20, in jan 2020. On triumeq. Also hx of CAD and atrial fibrillation  Keeping safe with wearing masks. Doesn't know any who has had it.   He reports situational " white coat "syndrome at all his doctor's visit.  Outpatient Encounter Medications as of 08/27/2019  Medication Sig  . abacavir-dolutegravir-lamiVUDine (TRIUMEQ) 600-50-300 MG tablet Take 1 tablet by mouth daily.  Marland Kitchen apixaban (ELIQUIS) 5 MG TABS tablet Take 1 tablet (5 mg total) by mouth 2 (two) times daily.  . carvedilol (COREG) 6.25 MG tablet TAKE 1 TABLET TWICE DAILY WITH A MEAL  . Cinnamon 500 MG capsule Take 1,000 mg by mouth daily.  . Cranberry 360 MG CAPS Take by mouth.  . Multiple Vitamins-Minerals (MULTIVITAMIN WITH MINERALS) tablet Take 1 tablet by mouth daily.    . niacin (NIASPAN) 1000 MG CR tablet Take 2 tablets (2,000 mg total) by mouth at bedtime.  Marland Kitchen omeprazole (PRILOSEC) 20 MG capsule TAKE 1 CAPSULE EVERY DAY  . pravastatin (PRAVACHOL) 40 MG tablet TAKE 1 TABLET EVERY DAY  . vitamin E 400 UNIT capsule Take 400 Units by mouth daily.    No facility-administered encounter medications on file as of 08/27/2019.      Patient Active Problem List   Diagnosis Date Noted  . Atrial fibrillation ([place]) 08/02/2019  . Postherpetic neuralgia 04/16/2015  . GERD (gastroesophageal reflux disease) 07/05/2013  . HIV disease ([place]) 05/24/2013  . Hypertension associated with diabetes ([place]) 01/25/2013  . ASHD (arteriosclerotic heart disease) 03/18/2011  . BPH (benign prostatic hyperplasia) 03/18/2011  . Hyperlipidemia associated with type 2 diabetes mellitus ([place]) 03/18/2011  . Diabetes mellitus ([place]) 03/18/2011     Health Maintenance Due  Topic Date Due  . OPHTHALMOLOGY EXAM  06/08/2016  . URINE MICROALBUMIN  08/11/2016  . HEMOGLOBIN A1C  04/20/2019  . INFLUENZA  VACCINE  05/19/2019    Social History   Tobacco Use  . Smoking status: Former Smoker    Packs/day: 0.12    Years: 40.00    Pack years: 4.80    Types: Cigarettes  . Smokeless tobacco: Never Used  . Tobacco comment: 07/04/2013 "I quit smoking before 2000"  Substance Use Topics  . Alcohol use: No  . Drug use: No    Review of Systems Review of Systems  Constitutional: Negative for fever, chills, diaphoresis, activity change, appetite change, fatigue and unexpected weight change.  HENT: Negative for congestion, sore throat, rhinorrhea, sneezing, trouble swallowing and sinus pressure.  Eyes: Negative for photophobia and visual disturbance.  Respiratory: Negative for cough, chest tightness, shortness of breath, wheezing and stridor.  Cardiovascular: Negative for chest pain, palpitations and leg swelling.  Gastrointestinal: Negative for nausea, vomiting, abdominal pain, diarrhea, constipation, blood in stool, abdominal distention and anal bleeding.  Genitourinary: Negative for dysuria, hematuria, flank pain and difficulty urinating.  Musculoskeletal: Negative for myalgias, back pain, joint swelling, arthralgias and gait problem.  Skin: Negative for color change, pallor, rash and wound.  Neurological: Negative for dizziness, tremors, weakness and light-headedness.  Hematological: Negative for adenopathy. Does not bruise/bleed easily.  Psychiatric/Behavioral: Negative for behavioral problems, confusion, sleep disturbance, dysphoric mood, decreased concentration and agitation.    Physical Exam   BP (!) 180/104   Pulse 66   Wt 173 lb (78.5 kg)   BMI 27.92 kg/m   Physical Exam  Constitutional: He is oriented to person, place, and time. He appears well-developed and well-nourished. No distress.  HENT:  Mouth/Throat: Oropharynx is clear and moist. No oropharyngeal exudate.  Cardiovascular: Normal rate, regular rhythm and normal heart sounds. Exam reveals no gallop and no friction rub.   No murmur heard.  Pulmonary/Chest: Effort normal and breath sounds normal. No respiratory distress. He has no wheezes.  Abdominal: Soft. Bowel sounds are normal. He exhibits no distension. There is no tenderness.  Lymphadenopathy:  He has no cervical adenopathy.  Neurological: He is alert and oriented to person, place, and time.  Skin: Skin is warm and dry. No rash noted. No erythema.  Psychiatric: He has a normal mood and affect. His behavior is normal.    Lab Results  Component Value Date   CD4TCELL 31 (L) 10/23/2018   Lab Results  Component Value Date   CD4TABS 520 10/23/2018   CD4TABS 630 04/19/2018   CD4TABS 610 10/06/2017   Lab Results  Component Value Date   HIV1RNAQUANT <20 NOT DETECTED 10/23/2018   Lab Results  Component Value Date   HEPBSAB NEG 05/24/2013   Lab Results  Component Value Date   LABRPR NON REAC 10/26/2016    CBC Lab Results  Component Value Date   WBC 6.3 10/23/2018   RBC 6.32 (H) 10/23/2018   HGB 17.5 (H) 10/23/2018   HCT 53.7 (H) 10/23/2018   PLT 110 (L) 10/23/2018   MCV 85.0 10/23/2018   MCH 27.7 10/23/2018   MCHC 32.6 10/23/2018   RDW 14.7 10/23/2018   LYMPHSABS 1,562 10/23/2018   MONOABS 495 04/05/2017   EOSABS 284 10/23/2018    BMET Lab Results  Component Value Date   NA 142 08/02/2019   K 4.2 08/02/2019   CL 102 08/02/2019   CO2 25 08/02/2019   GLUCOSE 82 08/02/2019   BUN 15 08/02/2019   CREATININE 1.51 (H) 08/02/2019   CALCIUM 9.2 08/02/2019   GFRNONAA 44 (L) 08/02/2019   GFRAA 51 (L) 08/02/2019      Assessment and Plan  htn = usually under normal controlled at home. White coat syndrome  ckd 3 =stable  History of zoster with neuropathy = still has intermittent discomfort to right leg  hiv disease= continue on triumeq, will do labs  Health maintenance = received flu vaccine

## 2019-08-28 ENCOUNTER — Other Ambulatory Visit: Payer: Self-pay | Admitting: Family Medicine

## 2019-08-28 DIAGNOSIS — E1159 Type 2 diabetes mellitus with other circulatory complications: Secondary | ICD-10-CM

## 2019-08-28 LAB — T-HELPER CELL (CD4) - (RCID CLINIC ONLY)
CD4 % Helper T Cell: 37 % (ref 33–65)
CD4 T Cell Abs: 499 /uL (ref 400–1790)

## 2019-08-30 LAB — COMPLETE METABOLIC PANEL WITH GFR
AG Ratio: 1.6 (calc) (ref 1.0–2.5)
ALT: 81 U/L — ABNORMAL HIGH (ref 9–46)
AST: 106 U/L — ABNORMAL HIGH (ref 10–35)
Albumin: 4.1 g/dL (ref 3.6–5.1)
Alkaline phosphatase (APISO): 69 U/L (ref 35–144)
BUN/Creatinine Ratio: 8 (calc) (ref 6–22)
BUN: 12 mg/dL (ref 7–25)
CO2: 30 mmol/L (ref 20–32)
Calcium: 9.6 mg/dL (ref 8.6–10.3)
Chloride: 104 mmol/L (ref 98–110)
Creat: 1.56 mg/dL — ABNORMAL HIGH (ref 0.70–1.18)
GFR, Est African American: 49 mL/min/{1.73_m2} — ABNORMAL LOW (ref >=60)
GFR, Est Non African American: 42 mL/min/{1.73_m2} — ABNORMAL LOW (ref >=60)
Globulin: 2.5 g/dL (calc) (ref 1.9–3.7)
Glucose, Bld: 92 mg/dL (ref 65–99)
Potassium: 4.6 mmol/L (ref 3.5–5.3)
Sodium: 142 mmol/L (ref 135–146)
Total Bilirubin: 0.8 mg/dL (ref 0.2–1.2)
Total Protein: 6.6 g/dL (ref 6.1–8.1)

## 2019-08-30 LAB — CBC WITH DIFFERENTIAL/PLATELET
Absolute Monocytes: 359 cells/uL (ref 200–950)
Basophils Absolute: 42 cells/uL (ref 0–200)
Basophils Relative: 0.8 %
Eosinophils Absolute: 109 cells/uL (ref 15–500)
Eosinophils Relative: 2.1 %
HCT: 51.1 % — ABNORMAL HIGH (ref 38.5–50.0)
Hemoglobin: 17.1 g/dL (ref 13.2–17.1)
Lymphs Abs: 1446 cells/uL (ref 850–3900)
MCH: 29.1 pg (ref 27.0–33.0)
MCHC: 33.5 g/dL (ref 32.0–36.0)
MCV: 87.1 fL (ref 80.0–100.0)
MPV: 12.5 fL (ref 7.5–12.5)
Monocytes Relative: 6.9 %
Neutro Abs: 3245 cells/uL (ref 1500–7800)
Neutrophils Relative %: 62.4 %
Platelets: 112 10*3/uL — ABNORMAL LOW (ref 140–400)
RBC: 5.87 10*6/uL — ABNORMAL HIGH (ref 4.20–5.80)
RDW: 14.8 % (ref 11.0–15.0)
Total Lymphocyte: 27.8 %
WBC: 5.2 10*3/uL (ref 3.8–10.8)

## 2019-08-30 LAB — HIV-1 RNA QUANT-NO REFLEX-BLD
HIV 1 RNA Quant: <20 copies/mL
HIV-1 RNA Quant, Log: <1.3 Log copies/mL

## 2019-08-30 LAB — RPR: RPR Ser Ql: NONREACTIVE

## 2019-09-03 ENCOUNTER — Other Ambulatory Visit: Payer: Self-pay | Admitting: Cardiovascular Disease

## 2019-09-10 ENCOUNTER — Other Ambulatory Visit: Payer: Self-pay | Admitting: Cardiovascular Disease

## 2019-09-18 ENCOUNTER — Other Ambulatory Visit: Payer: Self-pay | Admitting: Internal Medicine

## 2019-09-18 DIAGNOSIS — B20 Human immunodeficiency virus [HIV] disease: Secondary | ICD-10-CM

## 2019-09-26 ENCOUNTER — Other Ambulatory Visit: Payer: Self-pay | Admitting: Cardiovascular Disease

## 2019-09-27 ENCOUNTER — Telehealth: Payer: Self-pay | Admitting: Cardiovascular Disease

## 2019-09-27 NOTE — Telephone Encounter (Signed)
New Message  Pt c/o swelling: STAT is pt has developed SOB within 24 hours  1) How much weight have you gained and in what time span? No weight gain  2) If swelling, where is the swelling located? Right leg  3) Are you currently taking a fluid pill? No  4) Are you currently SOB? No  5) Do you have a log of your daily weights (if so, list)? No  6) Have you gained 3 pounds in a day or 5 pounds in a week? No  7) Have you traveled recently? No  Also have a question about Eliquis. Pt has to schedule a dentist appt. If dentist ask him to stop taking Eliquis will it be ok, and if so, for how long?  Please call to discuss

## 2019-09-27 NOTE — Telephone Encounter (Signed)
Patient was seen in office in October for right leg swelling.wife states swelling is bad during the day and almost completely resolves after lying in bed all night.he does not wear a compression stocking but has started elevating leg when sitting. Denies redness, warmth or calf pain.   His second issue is whether he should stop Eliquis for routine dental cleaning he is soon to have.He started med in October and they are not sure what to do.

## 2019-09-28 NOTE — Telephone Encounter (Signed)
I informed wife of Dr.Berry's message.

## 2019-09-28 NOTE — Telephone Encounter (Signed)
OK to hold Eliquis for dental work

## 2019-11-07 ENCOUNTER — Telehealth: Payer: Self-pay | Admitting: Cardiovascular Disease

## 2019-11-07 NOTE — Telephone Encounter (Addendum)
Unable to call pt since phone not accepting numbers that are blocked such as mine working remote.. will try the pt again from the office on 1/21.Marland Kitchen letter made for consent for Covid vaccine and ready to be mailed to the pt.

## 2019-11-07 NOTE — Telephone Encounter (Signed)
Advised [place] at front for pick up

## 2019-11-07 NOTE — Telephone Encounter (Signed)
LETTER PRINTED AND PLACED AT THE FRONT DESK

## 2019-11-07 NOTE — Telephone Encounter (Signed)
New Message:     Pt is having COVID Vaccine and he need a note stating that it is alright for him to take the Vaccine, he is on Eliquis.

## 2019-11-16 ENCOUNTER — Other Ambulatory Visit: Payer: Self-pay | Admitting: Cardiovascular Disease

## 2019-12-10 ENCOUNTER — Other Ambulatory Visit: Payer: Self-pay | Admitting: Internal Medicine

## 2019-12-10 DIAGNOSIS — E1169 Type 2 diabetes mellitus with other specified complication: Secondary | ICD-10-CM

## 2019-12-10 DIAGNOSIS — E785 Hyperlipidemia, unspecified: Secondary | ICD-10-CM

## 2020-01-01 ENCOUNTER — Other Ambulatory Visit: Payer: Self-pay | Admitting: Cardiovascular Disease

## 2020-01-24 ENCOUNTER — Other Ambulatory Visit: Payer: Self-pay | Admitting: Cardiovascular Disease

## 2020-02-25 ENCOUNTER — Encounter: Payer: Self-pay | Admitting: Internal Medicine

## 2020-02-25 ENCOUNTER — Other Ambulatory Visit: Payer: Self-pay

## 2020-02-25 ENCOUNTER — Ambulatory Visit (INDEPENDENT_AMBULATORY_CARE_PROVIDER_SITE_OTHER): Payer: MEDICARE | Admitting: Internal Medicine

## 2020-02-25 VITALS — BP 179/113 | HR 71 | Wt 175.0 lb

## 2020-02-25 DIAGNOSIS — Z79899 Other long term (current) drug therapy: Secondary | ICD-10-CM | POA: Diagnosis not present

## 2020-02-25 DIAGNOSIS — N183 Chronic kidney disease, stage 3 unspecified: Secondary | ICD-10-CM | POA: Diagnosis not present

## 2020-02-25 DIAGNOSIS — I10 Essential (primary) hypertension: Secondary | ICD-10-CM

## 2020-02-25 DIAGNOSIS — B20 Human immunodeficiency virus [HIV] disease: Secondary | ICD-10-CM | POA: Diagnosis not present

## 2020-02-25 NOTE — Progress Notes (Signed)
Patient ID: Jeremy Reeves, male   DOB: Mar 07, 1941, 79 y.o.   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 He states that he is doing well. He is Fully vaccinated against covid. No close contacts. No recent illnesses.  Outpatient Encounter Medications as of 02/25/2020  Medication Sig  . carvedilol (COREG) 6.25 MG tablet TAKE 1 TABLET TWICE DAILY WITH A MEAL  . Cinnamon 500 MG capsule Take 1,000 mg by mouth daily.  . Cranberry 360 MG CAPS Take by mouth.  Jeremy Reeves 5 MG TABS tablet TAKE 1 TABLET TWICE DAILY  . Multiple Vitamins-Minerals (MULTIVITAMIN WITH MINERALS) tablet Take 1 tablet by mouth daily.    . niacin (NIASPAN) 1000 MG CR tablet TAKE 2 TABLETS AT BEDTIME   . omeprazole (PRILOSEC) 20 MG capsule TAKE 1 CAPSULE EVERY DAY (NEEDS TO SCHEDULE YEARLY APPOINTMENT FOR FURTHER REFILLS)   . pravastatin (PRAVACHOL) 40 MG tablet TAKE 1 TABLET EVERY DAY  . TRIUMEQ 600-50-300 MG tablet TAKE 1 TABLET BY MOUTH DAILY  . vitamin E 400 UNIT capsule Take 400 Units by mouth daily.    No facility-administered encounter medications on file as of 02/25/2020.     Patient Active Problem List   Diagnosis Date Noted  . Atrial fibrillation (Jeremy Reeves) 08/02/2019  . Postherpetic neuralgia 04/16/2015  . GERD (gastroesophageal reflux disease) 07/05/2013  . HIV disease (Jeremy Reeves) 05/24/2013  . Hypertension associated with diabetes (Jeremy Reeves) 01/25/2013  . ASHD (arteriosclerotic heart disease) 03/18/2011  . BPH (benign prostatic hyperplasia) 03/18/2011  . Hyperlipidemia associated with type 2 diabetes mellitus (Jeremy Reeves) 03/18/2011  . Diabetes mellitus (Jeremy Reeves) 03/18/2011     Health Maintenance Due  Topic Date Due  . COVID-19 Vaccine (1) Never done  . OPHTHALMOLOGY EXAM  06/08/2016  . URINE MICROALBUMIN  08/11/2016  . HEMOGLOBIN A1C  04/20/2019  . FOOT EXAM  10/21/2019   soc hx: no smoking  Review of Systems Review of Systems  Constitutional:  Negative for fever, chills, diaphoresis, activity change, appetite change, fatigue and unexpected weight change.  HENT: Negative for congestion, sore throat, rhinorrhea, sneezing, trouble swallowing and sinus pressure.  Eyes: Negative for photophobia and visual disturbance.  Respiratory: Negative for cough, chest tightness, shortness of breath, wheezing and stridor.  Cardiovascular: Negative for chest pain, palpitations and leg swelling.  Gastrointestinal: Negative for nausea, vomiting, abdominal pain, diarrhea, constipation, blood in stool, abdominal distention and anal bleeding.  Genitourinary: Negative for dysuria, hematuria, flank pain and difficulty urinating.  Musculoskeletal: Negative for myalgias, back pain, joint swelling, arthralgias and gait problem.  Skin: Negative for color change, pallor, rash and wound.  Neurological: Negative for dizziness, tremors, weakness and light-headedness.  Hematological: Negative for adenopathy. Does not bruise/bleed easily.  Psychiatric/Behavioral: Negative for behavioral problems, confusion, sleep disturbance, dysphoric mood, decreased concentration and agitation.    Physical Exam   There were no vitals taken for this visit.   Lab Results  Component Value Date   CD4TCELL 37 08/27/2019   Lab Results  Component Value Date   CD4TABS 499 08/27/2019   CD4TABS 520 10/23/2018   CD4TABS 630 04/19/2018   Lab Results  Component Value Date   HIV1RNAQUANT <20 NOT DETECTED 08/27/2019   Lab Results  Component Value Date   HEPBSAB NEG 05/24/2013   Lab Results  Component Value Date   LABRPR NON-REACTIVE 08/27/2019    CBC Lab Results  Component Value Date   WBC 5.2 08/27/2019  RBC 5.87 (H) 08/27/2019   HGB 17.1 08/27/2019   HCT 51.1 (H) 08/27/2019   PLT 112 (L) 08/27/2019   MCV 87.1 08/27/2019   MCH 29.1 08/27/2019   MCHC 33.5 08/27/2019   RDW 14.8 08/27/2019   LYMPHSABS 1,446 08/27/2019   MONOABS 495 04/05/2017   EOSABS 109 08/27/2019     BMET Lab Results  Component Value Date   NA 142 08/27/2019   K 4.6 08/27/2019   CL 104 08/27/2019   CO2 30 08/27/2019   GLUCOSE 92 08/27/2019   BUN 12 08/27/2019   CREATININE 1.56 (H) 08/27/2019   CALCIUM 9.6 08/27/2019   GFRNONAA 42 (L) 08/27/2019   GFRAA 49 (L) 08/27/2019      Assessment and Plan  hiv disease = well controlled, continue on triumeq for now. If he stops taking omeprazole,  Can do a trial of juluca. But wants to think about it  Health miantenance = fully vaccinated.  Steady to do 1 yr appt

## 2020-02-26 LAB — T-HELPER CELL (CD4) - (RCID CLINIC ONLY)
CD4 % Helper T Cell: 43 % (ref 33–65)
CD4 T Cell Abs: 563 /uL (ref 400–1790)

## 2020-02-27 LAB — COMPLETE METABOLIC PANEL WITH GFR
AG Ratio: 1.6 (calc) (ref 1.0–2.5)
ALT: 46 U/L (ref 9–46)
AST: 41 U/L — ABNORMAL HIGH (ref 10–35)
Albumin: 4.1 g/dL (ref 3.6–5.1)
Alkaline phosphatase (APISO): 66 U/L (ref 35–144)
BUN/Creatinine Ratio: 9 (calc) (ref 6–22)
BUN: 13 mg/dL (ref 7–25)
CO2: 30 mmol/L (ref 20–32)
Calcium: 9.4 mg/dL (ref 8.6–10.3)
Chloride: 103 mmol/L (ref 98–110)
Creat: 1.38 mg/dL — ABNORMAL HIGH (ref 0.70–1.18)
GFR, Est African American: 57 mL/min/{1.73_m2} — ABNORMAL LOW (ref >=60)
GFR, Est Non African American: 49 mL/min/{1.73_m2} — ABNORMAL LOW (ref >=60)
Globulin: 2.5 g/dL (calc) (ref 1.9–3.7)
Glucose, Bld: 106 mg/dL — ABNORMAL HIGH (ref 65–99)
Potassium: 4.1 mmol/L (ref 3.5–5.3)
Sodium: 139 mmol/L (ref 135–146)
Total Bilirubin: 0.8 mg/dL (ref 0.2–1.2)
Total Protein: 6.6 g/dL (ref 6.1–8.1)

## 2020-02-27 LAB — HIV-1 RNA QUANT-NO REFLEX-BLD
HIV 1 RNA Quant: <20 copies/mL
HIV-1 RNA Quant, Log: <1.3 Log copies/mL

## 2020-03-05 ENCOUNTER — Other Ambulatory Visit: Payer: Self-pay

## 2020-03-05 ENCOUNTER — Ambulatory Visit (INDEPENDENT_AMBULATORY_CARE_PROVIDER_SITE_OTHER): Payer: MEDICARE | Admitting: Family Medicine

## 2020-03-05 ENCOUNTER — Encounter: Payer: Self-pay | Admitting: Family Medicine

## 2020-03-05 VITALS — BP 150/98 | HR 73 | Temp 98.1°F | Ht 66.0 in | Wt 177.6 lb

## 2020-03-05 DIAGNOSIS — E119 Type 2 diabetes mellitus without complications: Secondary | ICD-10-CM

## 2020-03-05 DIAGNOSIS — B20 Human immunodeficiency virus [HIV] disease: Secondary | ICD-10-CM | POA: Diagnosis not present

## 2020-03-05 DIAGNOSIS — I152 Hypertension secondary to endocrine disorders: Secondary | ICD-10-CM

## 2020-03-05 DIAGNOSIS — I2510 Atherosclerotic heart disease of native coronary artery without angina pectoris: Secondary | ICD-10-CM

## 2020-03-05 DIAGNOSIS — E1159 Type 2 diabetes mellitus with other circulatory complications: Secondary | ICD-10-CM

## 2020-03-05 DIAGNOSIS — E1169 Type 2 diabetes mellitus with other specified complication: Secondary | ICD-10-CM

## 2020-03-05 DIAGNOSIS — I10 Essential (primary) hypertension: Secondary | ICD-10-CM | POA: Diagnosis not present

## 2020-03-05 DIAGNOSIS — E785 Hyperlipidemia, unspecified: Secondary | ICD-10-CM | POA: Diagnosis not present

## 2020-03-05 DIAGNOSIS — N400 Enlarged prostate without lower urinary tract symptoms: Secondary | ICD-10-CM

## 2020-03-05 DIAGNOSIS — I4811 Longstanding persistent atrial fibrillation: Secondary | ICD-10-CM | POA: Diagnosis not present

## 2020-03-05 DIAGNOSIS — K219 Gastro-esophageal reflux disease without esophagitis: Secondary | ICD-10-CM

## 2020-03-05 NOTE — Patient Instructions (Signed)
  Mr. Jeremy Reeves , Thank you for taking time to come for your Medicare Wellness Visit. I appreciate your ongoing commitment to your health goals. Please review the following plan we discussed and let me know if I can assist you in the future.   These are the goals we discussed: Goals   None     This is a list of the screening recommended for you and due dates:  Health Maintenance  Topic Date Due  . Eye exam for diabetics  06/08/2016  . Urine Protein Check  08/11/2016  . Hemoglobin A1C  04/20/2019  . Flu Shot  05/18/2020  . Complete foot exam   03/05/2021  . Tetanus Vaccine  07/10/2028  . COVID-19 Vaccine  Completed  . Pneumonia vaccines  Completed

## 2020-03-05 NOTE — Progress Notes (Signed)
Jeremy Reeves is a 79 y.o. male who presents for annual wellness visit and follow-up on chronic medical conditions.  He is followed regularly by infectious disease as well as cardiology.  Recent blood work was reviewed.  He does have a history of diabetes however over the last several visits, his A1c was in the 6 range and now within the 5.8 range.  He has had no difficulty with chest pain, shortness of breath, PND or DOE.  He continues on Eliquis for his underlying atrial fib.  He also has a history of reflux and does use Prilosec on a daily basis.  Continues on Coreg as well as pravastatin without aches or pains.   Immunizations and Health Maintenance Immunization History  Administered Date(s) Administered  . Hepatitis B, adult 12/10/2013, 01/07/2014, 07/31/2014  . Influenza Split 09/08/2011, 08/15/2012  . Influenza, High Dose Seasonal PF 08/12/2015, 08/18/2016, 08/01/2017, 07/18/2018  . Influenza,inj,Quad PF,6+ Mos 06/08/2013, 07/31/2014, 07/30/2019  . Moderna SARS-COVID-2 Vaccination 11/13/2019, 12/11/2019  . Pneumococcal Conjugate-13 08/12/2015  . Pneumococcal Polysaccharide-23 01/12/2008, 08/21/2013  . Td 12/04/2015  . Tdap 01/12/2008, 07/10/2018  . Zoster 12/19/2009   Health Maintenance Due  Topic Date Due  . OPHTHALMOLOGY EXAM  06/08/2016  . URINE MICROALBUMIN  08/11/2016  . HEMOGLOBIN A1C  04/20/2019  . FOOT EXAM  10/21/2019    Last colonoscopy: 09/23/11 Last PSA: Unknown Dentist: One year Ophtho: one year  Exercise: staying active  Other doctors caring for patient include: [name], [name] cardio.   Advanced Directives: Not on file copy requested.    Depression screen:  See questionnaire below.     Depression screen [facility] 2/9 02/25/2020 08/27/2019 10/23/2018 11/28/2017 10/20/2017  Decreased Interest 0 0 0 0 0  Down, Depressed, Hopeless 0 0 0 0 0  PHQ - 2 Score 0 0 0 0 0    Fall Screen: See Questionaire below.   Fall Risk  02/25/2020 08/27/2019 10/23/2018  11/28/2017 10/20/2017  Falls in the past year? 0 0 0 No No  Number falls in past yr: - 0 - - -  Injury with Fall? - 0 - - -  Follow up Falls evaluation completed - - - -    ADL screen:  See questionnaire below.  Functional Status Survey:     Review of Systems  Constitutional: -, -unexpected weight change, -anorexia, -fatigue Allergy: -sneezing, -itching, -congestion Dermatology: denies changing moles, rash, lumps ENT: -runny nose, -ear pain, -sore throat,  Cardiology:  -chest pain, -palpitations, -orthopnea, Respiratory: -cough, -shortness of breath, -dyspnea on exertion, -wheezing,  Gastroenterology: -abdominal pain, -nausea, -vomiting, -diarrhea, -constipation, -dysphagia Hematology: -bleeding or bruising problems Musculoskeletal: -arthralgias, -myalgias, -joint swelling, -back pain, - Ophthalmology: -vision changes,  Urology: -dysuria, -difficulty urinating,  -urinary frequency, -urgency, incontinence Neurology: -, -numbness, , -memory loss, -falls, -dizziness    PHYSICAL EXAM:   General Appearance: Alert, cooperative, no distress, appears stated age Head: Normocephalic, without obvious abnormality, atraumatic Eyes: PERRL, conjunctiva/corneas clear, EOM's intact,  Ears: Normal TM's and external ear canals Nose: Nares normal, mucosa normal, no drainage or sinus   tenderness Throat: Lips, mucosa, and tongue normal; teeth and gums normal Neck: Supple, no lymphadenopathy, thyroid:no enlargement/tenderness/nodules; no carotid bruit or JVD Lungs: Clear to auscultation bilaterally without wheezes, rales or ronchi; respirations unlabored Heart: Regular rate and rhythm, S1 and S2 normal, no murmur, rub or gallop Abdomen: Soft, non-tender, nondistended, normoactive bowel sounds, no masses, no hepatosplenomegaly Extremities: No clubbing, cyanosis or edema Pulses: 2+ and symmetric all extremities  Skin: Skin color, texture, turgor normal, no rashes or lesions Lymph nodes: Cervical,  supraclavicular, and axillary nodes normal Neurologic: CNII-XII intact, normal strength, sensation and gait; reflexes 2+ and symmetric throughout   Psych: Normal mood, affect, hygiene and grooming  ASSESSMENT/PLAN: ASHD (arteriosclerotic heart disease)  Longstanding persistent atrial fibrillation (HCC)  Benign prostatic hyperplasia without lower urinary tract symptoms  Gastroesophageal reflux disease, unspecified whether esophagitis present  HIV disease ([place])  Hyperlipidemia associated with type 2 diabetes mellitus ([place])  Hypertension associated with diabetes ([place])  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  He apparently will be seeing infectious disease on a yearly basis now.  Did recommend he try to back off on the Prilosec to take it every other day and even potentially every third day.  He will try this.  Continue on his other present medications.  Since his hemoglobin A1c has been slowly diminishing I think it is appropriate to relabel him as diabetes in remission. Immunization recommendations discussed.  Medicare Attestation I have personally reviewed: The patient's medical and social history Their use of alcohol, tobacco or illicit drugs Their current medications and supplements The patient's functional ability including ADLs,fall risks, home safety risks, cognitive, and hearing and visual impairment Diet and physical activities Evidence for depression or mood disorders  The patient's weight, height, and BMI have been recorded in the chart.  I have made referrals, counseling, and provided education to the patient based on review of the above and I have provided the patient with a written personalized care plan for preventive services.     Jill Alexanders, MD   03/05/2020

## 2020-04-06 ENCOUNTER — Other Ambulatory Visit: Payer: Self-pay | Admitting: Internal Medicine

## 2020-04-06 DIAGNOSIS — B20 Human immunodeficiency virus [HIV] disease: Secondary | ICD-10-CM

## 2020-05-17 ENCOUNTER — Other Ambulatory Visit: Payer: Self-pay | Admitting: Cardiovascular Disease

## 2020-05-30 ENCOUNTER — Other Ambulatory Visit: Payer: Self-pay | Admitting: Family Medicine

## 2020-05-30 DIAGNOSIS — E1159 Type 2 diabetes mellitus with other circulatory complications: Secondary | ICD-10-CM

## 2020-06-12 ENCOUNTER — Other Ambulatory Visit: Payer: Self-pay | Admitting: Cardiovascular Disease

## 2020-06-12 NOTE — Telephone Encounter (Signed)
Rx has been sent to the pharmacy electronically. ° °

## 2020-07-01 ENCOUNTER — Telehealth: Payer: Self-pay | Admitting: Cardiovascular Disease

## 2020-07-01 NOTE — Telephone Encounter (Signed)
Encounter not needed

## 2020-07-09 ENCOUNTER — Telehealth: Payer: Self-pay

## 2020-07-09 NOTE — Telephone Encounter (Signed)
[facility] Patient Advocate Encounter   I was successful in securing patient a $7500 grant from [employer] (PAF) to provide copayment coverage for Triumeq  This will make the out of pocket cost $0.00.     I have spoken with the patient.    The billing information is as follows and has been shared with [place].      Dates of Eligibility: 07/09/20 through 07/09/21.  Patient knows to call the office with questions or concerns.  Ileene Patrick, [facility] Patient [facility] Phone: [phone] Fax:  [phone]

## 2020-07-10 ENCOUNTER — Telehealth: Payer: Self-pay | Admitting: *Deleted

## 2020-07-10 NOTE — Telephone Encounter (Signed)
   [facility] Pre-operative Risk Assessment    HEARTCARE STAFF: - Please ensure there is not already an duplicate clearance open for this procedure. - Under Visit Info/Reason for Call, type in Other and utilize the format Clearance MM/DD/YY or Clearance TBD. Do not use dashes or single digits. - If request is for dental extraction, please clarify the # of teeth to be extracted.  Request for surgical clearance: PER DENTAL OFFICE THE PT IS GOING TO PROCEED WITH THE DEEP CLEANING 1ST (WHICH IS DONE OVER A PERIOD OF A FEW WEEKS).   PER DENTAL OFFICE THE PT WILL THEN PROCEED SOMETIME SHORTLY AFTER THE DEEP CLEANING TO HAVE 2 TEETH TO BE REMOVED WHICH THIS HAS NOT YET BEEN SCHEDULED.  1. What type of surgery is being performed? DEEP CLEANING AND SCALING   2. When is this surgery scheduled? 07/16/20  3. What type of clearance is required (medical clearance vs. Pharmacy clearance to hold med vs. Both)? BOTH  4. Are there any medications that need to be held prior to surgery and how long? PER DR HERBERT McNEAL, DDS IS NOT REQUIRING ELIQUIS TO BE HELD FOR DEEP CLEANING; HOWEVER IS ASKING FOR CARDIOLOGY INPUT, IF CARDIOLOGY FEELS NEED TO HOLD ELIQUIS THEN THEY WILL HAVE THE PT HOLD ELIQUIS   5. Practice name and name of physician performing surgery? DR. Kandice Hams, DDS  6. What is the office phone number? [phone]   7.   What is the office fax number? [phone]  8.   Anesthesia type (None, local, MAC, general) ? ARTICAINE HCI & EPINEPHRINE   Julaine Hua 07/10/2020, 4:31 PM  _________________________________________________________________   (provider comments below)

## 2020-07-10 NOTE — Telephone Encounter (Signed)
   Primary Cardiologist: Quay Burow, MD  Chart reviewed as part of pre-operative protocol coverage. Simple dental extractions are considered low risk procedures per guidelines and generally do not require any specific cardiac clearance. It is also generally accepted that for simple extractions and dental cleanings, there is no need to interrupt blood thinner therapy.   SBE prophylaxis is not required for the patient.  I will route this recommendation to the requesting party via Epic fax function and remove from pre-op pool.  Please call with questions.  Deberah Pelton, NP 07/10/2020, 4:51 PM

## 2020-08-18 ENCOUNTER — Other Ambulatory Visit: Payer: Self-pay | Admitting: Cardiovascular Disease

## 2020-08-31 DIAGNOSIS — Z23 Encounter for immunization: Secondary | ICD-10-CM | POA: Diagnosis not present

## 2020-09-19 ENCOUNTER — Other Ambulatory Visit: Payer: Self-pay | Admitting: Cardiovascular Disease

## 2020-09-23 ENCOUNTER — Other Ambulatory Visit: Payer: Self-pay | Admitting: Internal Medicine

## 2020-09-23 DIAGNOSIS — B20 Human immunodeficiency virus [HIV] disease: Secondary | ICD-10-CM

## 2020-10-01 ENCOUNTER — Other Ambulatory Visit: Payer: Self-pay | Admitting: Cardiovascular Disease

## 2020-10-02 NOTE — Telephone Encounter (Signed)
Dx A.flutter Scr 1.38 79yo 80.6kg Last OV with Dr Gwenlyn Found Oct/2020

## 2020-10-03 ENCOUNTER — Other Ambulatory Visit: Payer: Self-pay | Admitting: Cardiovascular Disease

## 2020-10-16 DIAGNOSIS — Z23 Encounter for immunization: Secondary | ICD-10-CM | POA: Diagnosis not present

## 2020-12-15 ENCOUNTER — Other Ambulatory Visit: Payer: Self-pay | Admitting: Cardiovascular Disease

## 2021-03-10 ENCOUNTER — Ambulatory Visit: Payer: MEDICARE | Admitting: Family Medicine

## 2021-03-19 ENCOUNTER — Telehealth: Payer: Self-pay | Admitting: Cardiovascular Disease

## 2021-03-19 ENCOUNTER — Other Ambulatory Visit: Payer: Self-pay | Admitting: Internal Medicine

## 2021-03-19 DIAGNOSIS — B20 Human immunodeficiency virus [HIV] disease: Secondary | ICD-10-CM

## 2021-03-19 MED ORDER — NIACIN ER (ANTIHYPERLIPIDEMIC) 1000 MG PO TBCR
2000.0000 mg | EXTENDED_RELEASE_TABLET | Freq: Every day | ORAL | 0 refills | Status: DC
Start: 2021-03-19 — End: 2021-05-21

## 2021-03-19 NOTE — Telephone Encounter (Signed)
Spoke to patient's wife. Informed her patient has not had an officevisit since 08/02/2019. To continue to refill medication appointment is needed. Wife verbalized understanding.  appointment schedule for Aug 24,2022. Refill sent for 3 month supply to [facility] as requested.

## 2021-03-19 NOTE — Telephone Encounter (Signed)
Pt c/o medication issue:  1. Name of Medication: niacin (NIASPAN) 1000 MG CR tablet  2. How are you currently taking this medication (dosage and times per day)? As written  3. Are you having a reaction (difficulty breathing--STAT)? No   4. What is your medication issue? Patient needs a new prescription sent to [facility] [place] for a 3 month supply

## 2021-03-23 ENCOUNTER — Ambulatory Visit (INDEPENDENT_AMBULATORY_CARE_PROVIDER_SITE_OTHER): Payer: MEDICARE | Admitting: Internal Medicine

## 2021-03-23 ENCOUNTER — Other Ambulatory Visit: Payer: Self-pay

## 2021-03-23 ENCOUNTER — Encounter: Payer: Self-pay | Admitting: Internal Medicine

## 2021-03-23 VITALS — BP 180/93 | HR 72 | Temp 98.1°F | Wt 172.0 lb

## 2021-03-23 DIAGNOSIS — B20 Human immunodeficiency virus [HIV] disease: Secondary | ICD-10-CM

## 2021-03-23 MED ORDER — TRIUMEQ 600-50-300 MG PO TABS: 1.0000 | ORAL_TABLET | Freq: Every day | ORAL | 11 refills | Status: DC

## 2021-03-23 MED ORDER — TRIUMEQ 600-50-300 MG PO TABS
1.0000 | ORAL_TABLET | Freq: Every day | ORAL | 11 refills | Status: DC
Start: 2021-03-23 — End: 2021-03-23

## 2021-03-23 NOTE — Progress Notes (Signed)
RFV: follow up for hiv disease  Patient ID: Jeremy Reeves, male   DOB: [birth year], 80 y.o.   MRN: [id number]  HPI Jeremy Reeves is a 80yo M with well controlled hiv disease, CD 4 count of 563/VL<20 ( in may 2021) here for 12 month visit. On triumeq.  Keeping the house together. Hasn't had covid yet. Has had 4 doses of covid vaccine.  No health problems in the last year.   BP running 585-277 systolic   Outpatient Encounter Medications as of 03/23/2021  Medication Sig  . carvedilol (COREG) 6.25 MG tablet TAKE 1 TABLET TWICE DAILY WITH A MEAL  . Cinnamon 500 MG capsule Take 1,000 mg by mouth daily.  . Cranberry 360 MG CAPS Take by mouth.  Arne Cleveland 5 MG TABS tablet TAKE 1 TABLET TWICE DAILY (CALL CARDIOLOGIST OFFICE TO SCHEDULE APPOINTMENT)  . Multiple Vitamins-Minerals (MULTIVITAMIN WITH MINERALS) tablet Take 1 tablet by mouth daily.    . niacin (NIASPAN) 1000 MG CR tablet Take 2 tablets (2,000 mg total) by mouth at bedtime. Keep appointment on 06/10/21  . omeprazole (PRILOSEC) 20 MG capsule TAKE 1 CAPSULE EVERY DAY (NEED TO SCHEDULE YEARLY APPOINTMENT FOR FURTHER REFILLS)  . pravastatin (PRAVACHOL) 40 MG tablet TAKE 1 TABLET EVERY DAY  . TRIUMEQ 600-50-300 MG tablet TAKE 1 TABLET BY MOUTH DAILY  . vitamin E 400 UNIT capsule Take 400 Units by mouth daily.    No facility-administered encounter medications on file as of 03/23/2021.     Patient Active Problem List   Diagnosis Date Noted  . Atrial fibrillation ([place]) 08/02/2019  . Postherpetic neuralgia 04/16/2015  . GERD (gastroesophageal reflux disease) 07/05/2013  . HIV disease ([place]) 05/24/2013  . Hypertension associated with diabetes ([place]) 01/25/2013  . ASHD (arteriosclerotic heart disease) 03/18/2011  . BPH (benign prostatic hyperplasia) 03/18/2011  . Hyperlipidemia associated with type 2 diabetes mellitus ([place]) 03/18/2011  . Type 2 diabetes mellitus in remission [facility]) 03/18/2011     Health Maintenance Due  Topic Date Due  .  Pneumococcal Vaccine 70-59 Years old (1 of 4 - PCV13) Never done  . Zoster Vaccines- Shingrix (1 of 2) Never done  . OPHTHALMOLOGY EXAM  06/08/2016  . URINE MICROALBUMIN  08/11/2016  . HEMOGLOBIN A1C  04/20/2019  . COVID-19 Vaccine (4 - Booster for Moderna series) 12/01/2020  . FOOT EXAM  03/05/2021     Review of Systems  Physical Exam   BP (!) 180/93   Pulse 72   Temp 98.1 F (36.7 C) (Oral)   SpO2 99%  Physical Exam  Constitutional: He is oriented to person, place, and time. He appears well-developed and well-nourished. No distress.  HENT:  Mouth/Throat: Oropharynx is clear and moist. No oropharyngeal exudate.  Cardiovascular: Normal rate, regular rhythm and normal heart sounds. Exam reveals no gallop and no friction rub.  No murmur heard.  Pulmonary/Chest: Effort normal and breath sounds normal. No respiratory distress. He has no wheezes.  Abdominal: Soft. Bowel sounds are normal. He exhibits no distension. There is no tenderness.  Lymphadenopathy:  He has no cervical adenopathy.  Neurological: He is alert and oriented to person, place, and time.  Skin: Skin is warm and dry. No rash noted. No erythema.  Psychiatric: He has a normal mood and affect. His behavior is normal.     Lab Results  Component Value Date   CD4TCELL 43 02/25/2020   Lab Results  Component Value Date   CD4TABS 563 02/25/2020   CD4TABS 499 08/27/2019   CD4TABS  520 10/23/2018   Lab Results  Component Value Date   HIV1RNAQUANT <20 NOT DETECTED 02/25/2020   Lab Results  Component Value Date   HEPBSAB NEG 05/24/2013   Lab Results  Component Value Date   LABRPR NON-REACTIVE 08/27/2019    CBC Lab Results  Component Value Date   WBC 5.2 08/27/2019   RBC 5.87 (H) 08/27/2019   HGB 17.1 08/27/2019   HCT 51.1 (H) 08/27/2019   PLT 112 (L) 08/27/2019   MCV 87.1 08/27/2019   MCH 29.1 08/27/2019   MCHC 33.5 08/27/2019   RDW 14.8 08/27/2019   LYMPHSABS 1,446 08/27/2019   MONOABS 495 04/05/2017    EOSABS 109 08/27/2019    BMET Lab Results  Component Value Date   NA 139 02/25/2020   K 4.1 02/25/2020   CL 103 02/25/2020   CO2 30 02/25/2020   GLUCOSE 106 (H) 02/25/2020   BUN 13 02/25/2020   CREATININE 1.38 (H) 02/25/2020   CALCIUM 9.4 02/25/2020   GFRNONAA 49 (L) 02/25/2020   GFRAA 57 (L) 02/25/2020      Assessment and Plan  White coat hypertension = reassured that he checks bp at home.   hiv disease = continue with triumeq  Hyperlipidemia = PCP to check lipid.  ckd 3 = will check cr

## 2021-03-24 LAB — URINALYSIS
Bilirubin Urine: NEGATIVE
Glucose, UA: NEGATIVE
Hgb urine dipstick: NEGATIVE
Ketones, ur: NEGATIVE
Leukocytes,Ua: NEGATIVE
Nitrite: NEGATIVE
Specific Gravity, Urine: 1.017 (ref 1.001–1.035)
pH: 6 (ref 5.0–8.0)

## 2021-03-24 LAB — T-HELPER CELL (CD4) - (RCID CLINIC ONLY)
CD4 % Helper T Cell: 40 % (ref 33–65)
CD4 T Cell Abs: 459 /uL (ref 400–1790)

## 2021-03-25 LAB — CBC WITH DIFFERENTIAL/PLATELET
Absolute Monocytes: 528 cells/uL (ref 200–950)
Basophils Absolute: 29 cells/uL (ref 0–200)
Basophils Relative: 0.6 %
Eosinophils Absolute: 110 cells/uL (ref 15–500)
Eosinophils Relative: 2.3 %
HCT: 50.5 % — ABNORMAL HIGH (ref 38.5–50.0)
Hemoglobin: 16.6 g/dL (ref 13.2–17.1)
Lymphs Abs: 1358 cells/uL (ref 850–3900)
MCH: 28.6 pg (ref 27.0–33.0)
MCHC: 32.9 g/dL (ref 32.0–36.0)
MCV: 86.9 fL (ref 80.0–100.0)
MPV: 12.4 fL (ref 7.5–12.5)
Monocytes Relative: 11 %
Neutro Abs: 2774 cells/uL (ref 1500–7800)
Neutrophils Relative %: 57.8 %
Platelets: 98 10*3/uL — ABNORMAL LOW (ref 140–400)
RBC: 5.81 10*6/uL — ABNORMAL HIGH (ref 4.20–5.80)
RDW: 14.6 % (ref 11.0–15.0)
Total Lymphocyte: 28.3 %
WBC: 4.8 10*3/uL (ref 3.8–10.8)

## 2021-03-25 LAB — COMPLETE METABOLIC PANEL WITH GFR
AG Ratio: 1.6 (calc) (ref 1.0–2.5)
ALT: 29 U/L (ref 9–46)
AST: 32 U/L (ref 10–35)
Albumin: 4.4 g/dL (ref 3.6–5.1)
Alkaline phosphatase (APISO): 58 U/L (ref 35–144)
BUN/Creatinine Ratio: 9 (calc) (ref 6–22)
BUN: 15 mg/dL (ref 7–25)
CO2: 28 mmol/L (ref 20–32)
Calcium: 9.2 mg/dL (ref 8.6–10.3)
Chloride: 106 mmol/L (ref 98–110)
Creat: 1.61 mg/dL — ABNORMAL HIGH (ref 0.70–1.18)
GFR, Est African American: 47 mL/min/{1.73_m2} — ABNORMAL LOW (ref >=60)
GFR, Est Non African American: 40 mL/min/{1.73_m2} — ABNORMAL LOW (ref >=60)
Globulin: 2.7 g/dL (calc) (ref 1.9–3.7)
Glucose, Bld: 99 mg/dL (ref 65–99)
Potassium: 4 mmol/L (ref 3.5–5.3)
Sodium: 143 mmol/L (ref 135–146)
Total Bilirubin: 0.8 mg/dL (ref 0.2–1.2)
Total Protein: 7.1 g/dL (ref 6.1–8.1)

## 2021-03-25 LAB — HIV-1 RNA QUANT-NO REFLEX-BLD
HIV 1 RNA Quant: NOT DETECTED Copies/mL
HIV-1 RNA Quant, Log: NOT DETECTED Log cps/mL

## 2021-04-09 ENCOUNTER — Ambulatory Visit (INDEPENDENT_AMBULATORY_CARE_PROVIDER_SITE_OTHER): Payer: MEDICARE | Admitting: Family Medicine

## 2021-04-09 ENCOUNTER — Other Ambulatory Visit: Payer: Self-pay

## 2021-04-09 VITALS — BP 170/100 | HR 73 | Temp 97.6°F | Ht 65.75 in | Wt 171.4 lb

## 2021-04-09 DIAGNOSIS — E1159 Type 2 diabetes mellitus with other circulatory complications: Secondary | ICD-10-CM | POA: Diagnosis not present

## 2021-04-09 DIAGNOSIS — E1169 Type 2 diabetes mellitus with other specified complication: Secondary | ICD-10-CM | POA: Diagnosis not present

## 2021-04-09 DIAGNOSIS — K219 Gastro-esophageal reflux disease without esophagitis: Secondary | ICD-10-CM

## 2021-04-09 DIAGNOSIS — E785 Hyperlipidemia, unspecified: Secondary | ICD-10-CM | POA: Diagnosis not present

## 2021-04-09 DIAGNOSIS — N1831 Chronic kidney disease, stage 3a: Secondary | ICD-10-CM

## 2021-04-09 DIAGNOSIS — I2510 Atherosclerotic heart disease of native coronary artery without angina pectoris: Secondary | ICD-10-CM | POA: Diagnosis not present

## 2021-04-09 DIAGNOSIS — I4811 Longstanding persistent atrial fibrillation: Secondary | ICD-10-CM | POA: Diagnosis not present

## 2021-04-09 DIAGNOSIS — I152 Hypertension secondary to endocrine disorders: Secondary | ICD-10-CM | POA: Diagnosis not present

## 2021-04-09 DIAGNOSIS — E119 Type 2 diabetes mellitus without complications: Secondary | ICD-10-CM | POA: Diagnosis not present

## 2021-04-09 DIAGNOSIS — B20 Human immunodeficiency virus [HIV] disease: Secondary | ICD-10-CM

## 2021-04-09 LAB — POCT GLYCOSYLATED HEMOGLOBIN (HGB A1C): Hemoglobin A1C: 6 % — AB (ref 4.0–5.6)

## 2021-04-09 NOTE — Progress Notes (Signed)
Jeremy Reeves is a 80 y.o. male who presents for annual wellness visit and follow-up on chronic medical conditions.  He has no particular concerns or complaints.  He is followed by ID and recent blood work was done and looks quite good.  He does check his blood pressure at home and his machine at home is accurate.  His numbers have been below 120/80.  Record does indicate CKD.  He also sees cardiology for his underlying ASHD.  He is on Eliquis for his AF.  He also takes Coreg.  Continues on Pravachol and having no difficulty with that.  He has had no difficulty with chest pain, shortness of breath.  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  He does have reflux disease and does use omeprazole on a daily basis.   Immunizations and Health Maintenance Immunization History  Administered Date(s) Administered   Fluad Quad(high Dose 65+) 10/16/2020   Hepatitis B, adult 12/10/2013, 01/07/2014, 07/31/2014   Influenza Split 09/08/2011, 08/15/2012   Influenza, High Dose Seasonal PF 08/12/2015, 08/18/2016, 08/01/2017, 07/18/2018   Influenza,inj,Quad PF,6+ Mos 06/08/2013, 07/31/2014, 07/30/2019   Moderna Sars-Covid-2 Vaccination 11/13/2019, 12/11/2019, 08/31/2020   Pneumococcal Conjugate-13 08/12/2015   Pneumococcal Polysaccharide-23 01/12/2008, 08/21/2013   Td 12/04/2015   Tdap 01/12/2008, 07/10/2018   Zoster, Live 12/19/2009   Health Maintenance Due  Topic Date Due   Zoster Vaccines- Shingrix (1 of 2) Never done   OPHTHALMOLOGY EXAM  06/08/2016   URINE MICROALBUMIN  08/11/2016   HEMOGLOBIN A1C  04/20/2019   COVID-19 Vaccine (4 - Booster for Moderna series) 12/01/2020   FOOT EXAM  03/05/2021    Last colonoscopy: 09/23/11 Last PSA: 08/15/12 Dentist: Q year Ophtho: Q year Exercise: walking one hour  three days a week  Other doctors caring for patient include: Jeremy Reeves ID, Jeremy Reeves cardiology  Advanced Directives: Does Patient Have a Medical Advance Directive?: Yes Type of  Advance Directive: Living will, Healthcare Power of Attorney Does patient want to make changes to medical advance directive?: No - Patient declined Copy of [place] in Chart?: No - copy requested  Depression screen:  See questionnaire below.     Depression screen [facility] 2/9 04/09/2021 03/23/2021 03/05/2020 02/25/2020 08/27/2019  Decreased Interest 0 0 0 0 0  Down, Depressed, Hopeless 0 0 0 0 0  PHQ - 2 Score 0 0 0 0 0    Fall Screen: See Questionaire below.   Fall Risk  04/09/2021 03/23/2021 03/05/2020 02/25/2020 08/27/2019  Falls in the past year? 0 0 0 0 0  Number falls in past yr: 0 - - - 0  Injury with Fall? 0 - - - 0  Risk for fall due to : No Fall Risks History of fall(s) - - -  Follow up Falls evaluation completed Falls evaluation completed - Falls evaluation completed -    ADL screen:  See questionnaire below.  Functional Status Survey: Is the patient deaf or have difficulty hearing?: No Does the patient have difficulty seeing, even when wearing glasses/contacts?: No Does the patient have difficulty concentrating, remembering, or making decisions?: No Does the patient have difficulty walking or climbing stairs?: No Does the patient have difficulty dressing or bathing?: No Does the patient have difficulty doing errands alone such as visiting a doctor's office or shopping?: No   Review of Systems  Constitutional: -, -unexpected weight change, -anorexia, -fatigue Allergy: -sneezing, -itching, -congestion Dermatology: denies changing moles, rash, lumps ENT: -runny nose, -ear pain, -sore  throat,  Cardiology:  -chest pain, -palpitations, -orthopnea, Respiratory: -cough, -shortness of breath, -dyspnea on exertion, -wheezing,  Gastroenterology: -abdominal pain, -nausea, -vomiting, -diarrhea, -constipation, -dysphagia Hematology: -bleeding or bruising problems Musculoskeletal: -arthralgias, -myalgias, -joint swelling, -back pain, - Ophthalmology: -vision changes,   Urology: -dysuria, -difficulty urinating,  -urinary frequency, -urgency, incontinence Neurology: -, -numbness, , -memory loss, -falls, -dizziness    PHYSICAL EXAM:  General Appearance: Alert, cooperative, no distress, appears stated age Head: Normocephalic, without obvious abnormality, atraumatic Eyes: PERRL, conjunctiva/corneas clear, EOM's intact, Ears: Normal TM's and external ear canals Nose: Nares normal, mucosa normal, no drainage or sinus   tenderness Throat: Lips, mucosa, and tongue normal; teeth and gums normal Neck: Supple, no lymphadenopathy, thyroid:no enlargement/tenderness/nodules; no carotid bruit or JVD Lungs: Clear to auscultation bilaterally without wheezes, rales or ronchi; respirations unlabored Heart: Iregular rate , S1 and S2 normal, no murmur, rub or gallop Abdomen: Soft, non-tender, nondistended, normoactive bowel sounds, no masses, no hepatosplenomegaly Skin: Skin color, texture, turgor normal, no rashes or lesions Lymph nodes: Cervical, supraclavicular, and axillary nodes normal Neurologic: CNII-XII intact, normal strength, sensation and gait; reflexes 2+ and symmetric throughout   Psych: Normal mood, affect, hygiene and grooming Hemoglobin A1c is 6.0 blood work from early June was reviewed. 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  Recommend he get Shingrix although he is not interested.   Recommend he get Shingrix but states he is not interested.  Colonoscopy recommendations reviewed, not recommended at the present time.  He will continue to be followed by infectious disease as well as cardiology.  He has an appointment with cardiology in the near future.   Medicare Attestation I have personally reviewed: The patient's medical and social history Their use of alcohol, tobacco or illicit drugs Their current medications and supplements The patient's functional ability including ADLs,fall risks, home safety risks, cognitive, and hearing and visual impairment Diet and physical activities Evidence for depression or mood disorders  The patient's weight, height, and BMI have been recorded in the chart.  I have made referrals, counseling, and provided education to the patient based on review of the above and I have provided the patient with a written personalized care plan for preventive services.     Jill Alexanders, MD   04/09/2021

## 2021-04-09 NOTE — Patient Instructions (Signed)
  Mr. Jeremy Reeves , Thank you for taking time to come for your Medicare Wellness Visit. I appreciate your ongoing commitment to your health goals. Please review the following plan we discussed and let me know if I can assist you in the future.   These are the goals we discussed: Take the omeprazole every other day or even every third day to keep your symptoms under control.   This is a list of the screening recommended for you and due dates:  Health Maintenance  Topic Date Due   Zoster (Shingles) Vaccine (1 of 2) Never done   Eye exam for diabetics  06/08/2016   Urine Protein Check  08/11/2016   Hemoglobin A1C  04/20/2019   COVID-19 Vaccine (4 - Booster for Moderna series) 04/25/2021*   Flu Shot  05/18/2021   Complete foot exam   04/09/2022   Tetanus Vaccine  07/10/2028   Hepatitis C Screening: USPSTF Recommendation to screen - Ages 18-79 yo.  Completed   Pneumonia vaccines  Completed   HPV Vaccine  Aged Out  *Topic was postponed. The date shown is not the original due date.

## 2021-04-13 ENCOUNTER — Telehealth: Payer: Self-pay | Admitting: Family Medicine

## 2021-04-13 NOTE — Telephone Encounter (Signed)
Pt left original Advanced Directives at his last appt. A copy was scanned into his chart and the original was mailed back to pt. To address in Epic.

## 2021-04-16 ENCOUNTER — Other Ambulatory Visit: Payer: Self-pay | Admitting: Internal Medicine

## 2021-04-16 DIAGNOSIS — B20 Human immunodeficiency virus [HIV] disease: Secondary | ICD-10-CM

## 2021-04-18 DIAGNOSIS — Z23 Encounter for immunization: Secondary | ICD-10-CM | POA: Diagnosis not present

## 2021-04-22 ENCOUNTER — Other Ambulatory Visit: Payer: Self-pay | Admitting: Family Medicine

## 2021-04-22 DIAGNOSIS — I152 Hypertension secondary to endocrine disorders: Secondary | ICD-10-CM

## 2021-05-21 ENCOUNTER — Other Ambulatory Visit: Payer: Self-pay | Admitting: Cardiovascular Disease

## 2021-06-10 ENCOUNTER — Ambulatory Visit (INDEPENDENT_AMBULATORY_CARE_PROVIDER_SITE_OTHER): Payer: MEDICARE | Admitting: Cardiovascular Disease

## 2021-06-10 ENCOUNTER — Encounter: Payer: Self-pay | Admitting: Cardiovascular Disease

## 2021-06-10 ENCOUNTER — Other Ambulatory Visit: Payer: Self-pay

## 2021-06-10 VITALS — BP 155/80 | HR 66 | Ht 66.0 in | Wt 170.4 lb

## 2021-06-10 DIAGNOSIS — E785 Hyperlipidemia, unspecified: Secondary | ICD-10-CM

## 2021-06-10 DIAGNOSIS — E1159 Type 2 diabetes mellitus with other circulatory complications: Secondary | ICD-10-CM

## 2021-06-10 DIAGNOSIS — I4811 Longstanding persistent atrial fibrillation: Secondary | ICD-10-CM

## 2021-06-10 DIAGNOSIS — I152 Hypertension secondary to endocrine disorders: Secondary | ICD-10-CM

## 2021-06-10 DIAGNOSIS — E1169 Type 2 diabetes mellitus with other specified complication: Secondary | ICD-10-CM | POA: Diagnosis not present

## 2021-06-10 DIAGNOSIS — I2510 Atherosclerotic heart disease of native coronary artery without angina pectoris: Secondary | ICD-10-CM | POA: Diagnosis not present

## 2021-06-10 NOTE — Assessment & Plan Note (Signed)
History of chronic A. fib rate controlled on Eliquis oral anticoagulation 

## 2021-06-10 NOTE — Assessment & Plan Note (Signed)
History of essential hypertension a blood pressure measured today at 155/80 although at home his wife says his blood pressure is much better controlled.  He is on carvedilol.

## 2021-06-10 NOTE — Assessment & Plan Note (Signed)
History of hyperlipidemia on pravastatin.  His last lipid profile was over 2 years ago.  We will recheck a fasting lipid liver profile.

## 2021-06-10 NOTE — Assessment & Plan Note (Signed)
History of CAD status post CABG in 1999 with a LIMA to the LAD, vein to marginal branch and to the RCA.  Myoview performed in 2013 was nonischemic.  The patient denies chest pain or shortness of breath.

## 2021-06-10 NOTE — Progress Notes (Signed)
06/10/2021 [name]   11-Sep-1941  [id number]  Primary Physician Denita Lung, MD Primary Cardiologist: Lorretta Harp MD FACP, [place], [place], [place]  HPI:  Jeremy Reeves is a 80 y.o.  moderately overweight, married [place] American male, father of 2, grandfather to 3 grandchildren who I last saw in the office 08/02/2019.Marland Kitchen  He is accompanied by his wife Meredith Mody  today.  He has a history of ischemic heart disease status post coronary artery bypass grafting in 1999 with a LIMA to his LAD, a vein to an obtuse marginal branch and to the RCA. Myoview performed February 2013 was nonischemic. He denies chest pain or shortness of breath.   Since I saw him almost 2 years ago he continues to do well.  Denies chest pain or shortness of breath.  When I saw him 2 years ago he was in a flutter with variable ventricular response and was begun on Eliquis.  Current Meds  Medication Sig   abacavir-dolutegravir-lamiVUDine (TRIUMEQ) 600-50-300 MG tablet Take 1 tablet by mouth daily.   carvedilol (COREG) 6.25 MG tablet TAKE 1 TABLET TWICE DAILY WITH A MEAL   Cinnamon 500 MG capsule Take 1,000 mg by mouth daily.   Cranberry 360 MG CAPS Take by mouth.   ELIQUIS 5 MG TABS tablet TAKE 1 TABLET TWICE DAILY (CALL CARDIOLOGIST OFFICE TO SCHEDULE APPOINTMENT)   Multiple Vitamins-Minerals (MULTIVITAMIN WITH MINERALS) tablet Take 1 tablet by mouth daily.   niacin (NIASPAN) 1000 MG CR tablet TAKE 2 TABLETS (2,000 MG) AT BEDTIME. KEEP APPOINTMENT ON 06/10/21.   omeprazole (PRILOSEC) 20 MG capsule TAKE 1 CAPSULE EVERY DAY (NEED TO SCHEDULE YEARLY APPOINTMENT FOR FURTHER REFILLS)   pravastatin (PRAVACHOL) 40 MG tablet TAKE 1 TABLET EVERY DAY   vitamin E 400 UNIT capsule Take 400 Units by mouth daily.     Allergies  Allergen Reactions   Peanut-Containing Drug Products Swelling    Social History   Socioeconomic History   Marital status: Married    Spouse name: Not on file   Number of children: Not on  file   Years of education: Not on file   Highest education level: Not on file  Occupational History   Not on file  Tobacco Use   Smoking status: Former    Packs/day: 0.12    Years: 40.00    Pack years: 4.80    Types: Cigarettes   Smokeless tobacco: Never   Tobacco comments:    07/04/2013 "I quit smoking before 2000"  Vaping Use   Vaping Use: Never used  Substance and Sexual Activity   Alcohol use: No   Drug use: No   Sexual activity: Not Currently    Partners: Female    Comment: declined condoms  Other Topics Concern   Not on file  Social History Narrative   Not on file   Social Determinants of Health   Financial Resource Strain: Not on file  Food Insecurity: Not on file  Transportation Needs: Not on file  Physical Activity: Not on file  Stress: Not on file  Social Connections: Not on file  Intimate Partner Violence: Not on file     Review of Systems: General: negative for chills, fever, night sweats or weight changes.  Cardiovascular: negative for chest pain, dyspnea on exertion, edema, orthopnea, palpitations, paroxysmal nocturnal dyspnea or shortness of breath Dermatological: negative for rash Respiratory: negative for cough or wheezing Urologic: negative for hematuria Abdominal: negative for nausea, vomiting, diarrhea, bright red blood per  rectum, melena, or hematemesis Neurologic: negative for visual changes, syncope, or dizziness All other systems reviewed and are otherwise negative except as noted above.    Blood pressure (!) 155/80, pulse 66, height '5\' 6"'$  (1.676 m), weight 170 lb 6.4 oz (77.3 kg), SpO2 98 %.  General appearance: alert and no distress Neck: no adenopathy, no carotid bruit, no JVD, supple, symmetrical, trachea midline, and thyroid not enlarged, symmetric, no tenderness/mass/nodules Lungs: clear to auscultation bilaterally Heart: irregularly irregular rhythm Extremities: extremities normal, atraumatic, no cyanosis or edema Pulses: 2+ and  symmetric Skin: Skin color, texture, turgor normal. 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  I personally reviewed this EKG.  ASSESSMENT AND PLAN:   ASHD (arteriosclerotic heart disease) History of CAD status post CABG in 1999 with a LIMA to the LAD, vein to marginal branch and to the RCA.  Myoview performed in 2013 was nonischemic.  The patient denies chest pain or shortness of breath.  Hyperlipidemia associated with type 2 diabetes mellitus ([place]) History of hyperlipidemia on pravastatin.  His last lipid profile was over 2 years ago.  We will recheck a fasting lipid liver profile.  Hypertension associated with diabetes ([place]) History of essential hypertension a blood pressure measured today at 155/80 although at home his wife says his blood pressure is much better controlled.  He is on carvedilol.  Atrial fibrillation (HCC) History of chronic A. fib rate controlled on Eliquis oral anticoagulation.     Lorretta Harp MD FACP,FACC,FAHA, [facility] 06/10/2021 3:23 PM

## 2021-06-10 NOTE — Patient Instructions (Signed)
Medication Instructions:  The current medical regimen is effective;  continue present plan and medications.  *If you need a refill on your cardiac medications before your next appointment, please call your pharmacy*   Lab Work: LIPID, LIVER (come back fasting, nothing to eat or drink, no lab appointment needed)   If you have labs (blood work) drawn today and your tests are completely normal, you will receive your results only by: [place] (if you have MyChart) OR A paper copy in the mail If you have any lab test that is abnormal or we need to change your treatment, we will call you to review the results.   Follow-Up: At [facility], you and your health needs are our priority.  As part of our continuing mission to provide you with exceptional heart care, we have created designated Provider Care Teams.  These Care Teams include your primary Cardiologist (physician) and Advanced Practice Providers (APPs -  Physician Assistants and Nurse Practitioners) who all work together to provide you with the care you need, when you need it.  We recommend signing up for the patient portal called "MyChart".  Sign up information is provided on this After Visit Summary.  MyChart is used to connect with patients for Virtual Visits (Telemedicine).  Patients are able to view lab/test results, encounter notes, upcoming appointments, etc.  Non-urgent messages can be sent to your provider as well.   To learn more about what you can do with MyChart, go to [email or web address].    Your next appointment:   12 month(s)  The format for your next appointment:   In Person  Provider:   Quay Burow, MD

## 2021-06-16 DIAGNOSIS — E785 Hyperlipidemia, unspecified: Secondary | ICD-10-CM | POA: Diagnosis not present

## 2021-06-16 DIAGNOSIS — E1169 Type 2 diabetes mellitus with other specified complication: Secondary | ICD-10-CM | POA: Diagnosis not present

## 2021-06-16 LAB — LIPID PANEL
Chol/HDL Ratio: 2.1 ratio (ref 0.0–5.0)
Cholesterol, Total: 134 mg/dL (ref 100–199)
HDL: 64 mg/dL (ref >39)
LDL Chol Calc (NIH): 59 mg/dL (ref 0–99)
Triglycerides: 46 mg/dL (ref 0–149)
VLDL Cholesterol Cal: 11 mg/dL (ref 5–40)

## 2021-06-16 LAB — HEPATIC FUNCTION PANEL
ALT: 37 IU/L (ref 0–44)
AST: 36 IU/L (ref 0–40)
Albumin: 4.6 g/dL (ref 3.7–4.7)
Alkaline Phosphatase: 76 IU/L (ref 44–121)
Bilirubin Total: 0.8 mg/dL (ref 0.0–1.2)
Bilirubin, Direct: 0.34 mg/dL (ref 0.00–0.40)
Total Protein: 7.2 g/dL (ref 6.0–8.5)

## 2021-06-29 ENCOUNTER — Telehealth: Payer: Self-pay | Admitting: Cardiovascular Disease

## 2021-06-29 NOTE — Telephone Encounter (Signed)
*  STAT* If patient is at the pharmacy, call can be transferred to refill team.   1. Which medications need to be refilled? (please list name of each medication and dose if known)  omeprazole (PRILOSEC) 20 MG capsule  2. Which pharmacy/location (including street and city if local pharmacy) is medication to be sent to? [facility] - [place], [place]  3. Do they need a 30 day or 90 day supply?  90 day supply

## 2021-06-30 MED ORDER — OMEPRAZOLE 20 MG PO CPDR: 20.0000 mg | DELAYED_RELEASE_CAPSULE | Freq: Every day | ORAL | 3 refills | Status: DC

## 2021-06-30 NOTE — Telephone Encounter (Signed)
Refills has been sen to the pharmacy.

## 2021-07-06 ENCOUNTER — Telehealth: Payer: Self-pay | Admitting: Cardiovascular Disease

## 2021-07-06 MED ORDER — APIXABAN 5 MG PO TABS: ORAL_TABLET | ORAL | 3 refills | Status: DC

## 2021-07-06 NOTE — Telephone Encounter (Signed)
*  STAT* If patient is at the pharmacy, call can be transferred to refill team.   1. Which medications need to be refilled? (please list name of each medication and dose if known)  ELIQUIS 5 MG TABS tablet  2. Which pharmacy/location (including street and city if local pharmacy) is medication to be sent to?  [place], [place]  3. Do they need a 30 day or 90 day supply? 90ds   Pt c/o medication issue:  1. Name of Medication: ELIQUIS 5 MG TABS tablet  2. How are you currently taking this medication (dosage and times per day)? TAKE 1 TABLET TWICE DAILY   3. Are you having a reaction (difficulty breathing--STAT)? no  4. What is your medication issue? [facility], [facility]'t [facility] when they were supposed to and even with rush order wouldn't get to the pt prior to him running out of medication, pt would like new rx for this medicine sent to [place], [address] AT [place], so that they are able to pick them up asap

## 2021-07-10 ENCOUNTER — Other Ambulatory Visit (HOSPITAL_COMMUNITY): Payer: Self-pay

## 2021-07-10 ENCOUNTER — Telehealth: Payer: Self-pay

## 2021-07-10 NOTE — Telephone Encounter (Signed)
[facility] Patient Advocate Encounter   I was successful in securing patient a $7500.00 grant from [employer] ([employer]) to provide copayment coverage for Triumeq.  This will make the out of pocket cost $0.00.     I have spoken with the patient.        Patient knows to call the office with questions or concerns.  Charonda Hefter, CPhT Specialty Pharmacy Patient [facility] Phone: [phone] Fax:  [phone]  

## 2021-07-21 ENCOUNTER — Telehealth: Payer: Self-pay

## 2021-07-21 NOTE — Telephone Encounter (Signed)
RN spoke with patient and his wife. Explained that pravastatin has not been filled by [name] in almost 2 years and that it would be best managed by his PCP or cardiologist. Patient's wife states she will reach out to Dr. Redmond School.   Beryle Flock, RN

## 2021-07-21 NOTE — Telephone Encounter (Signed)
-----   Message from Roney Jaffe, CPhT sent at 07/21/2021  2:23 PM EDT ----- Regarding: Pravastatin Frazier Butt,  This patient wife Greogry Goodwyn called and said he need a refill on his Pravastatin and the pharmacy told her they have been reaching out to [place] and it has not been filled    Thank You ,  .donn

## 2021-08-05 ENCOUNTER — Telehealth: Payer: Self-pay | Admitting: Family Medicine

## 2021-08-05 DIAGNOSIS — E785 Hyperlipidemia, unspecified: Secondary | ICD-10-CM

## 2021-08-05 DIAGNOSIS — E1169 Type 2 diabetes mellitus with other specified complication: Secondary | ICD-10-CM

## 2021-08-05 NOTE — Telephone Encounter (Signed)
Pt is taking Pravastatin 40 mg every 2 days. He is due to get refilled. Pt's wife would like to know if he needs to continue or just discontinued. Please advise at [phone].

## 2021-08-06 NOTE — Telephone Encounter (Signed)
Pt wife wanted to know if pt should continue taking  40 mg BID of pravastatin. This was order by [name] and deferred to you for future refills. Please advise because pt is due for a refill. [place]

## 2021-08-07 NOTE — Telephone Encounter (Signed)
Pt wife stated that he taking it QOD per your recommendations and and will need a refill.  I confirmed that it is not 40 mg BID with wife getting the bottle. Please advise if this is ok with you to fill 40 mg Qty 90 with 1 refill with same directions take one tablet QD . Please advise [facility]

## 2021-08-08 MED ORDER — PRAVASTATIN SODIUM 40 MG PO TABS: 40.0000 mg | ORAL_TABLET | Freq: Every day | ORAL | 1 refills | Status: DC

## 2021-09-02 DIAGNOSIS — Z23 Encounter for immunization: Secondary | ICD-10-CM | POA: Diagnosis not present

## 2021-11-11 ENCOUNTER — Other Ambulatory Visit: Payer: Self-pay | Admitting: Cardiovascular Disease

## 2021-12-01 ENCOUNTER — Telehealth: Payer: Self-pay | Admitting: Cardiovascular Disease

## 2021-12-01 MED ORDER — APIXABAN 5 MG PO TABS: ORAL_TABLET | ORAL | 0 refills | Status: DC

## 2021-12-01 NOTE — Telephone Encounter (Signed)
°*  STAT* If patient is at the pharmacy, call can be transferred to refill team.   1. Which medications need to be refilled? (please list name of each medication and dose if known) apixaban (ELIQUIS) 5 MG TABS tablet  2. Which pharmacy/location (including street and city if local pharmacy) is medication to be sent to? [place], [place]  3. Do they need a 30 day or 90 day supply? 10 day supply, until his mail order pharmacy sends his regular script.    Patient is out of medication

## 2021-12-01 NOTE — Telephone Encounter (Signed)
Prescription refill request for Eliquis received. Indication:  Afib  Last office visit:06/10/21 Gwenlyn Found)  Scr:1.61 (03/23/21) Age: 81 Weight: 77.3kg  Appropriate dose and refill sent to requested pharmacy.

## 2021-12-02 ENCOUNTER — Other Ambulatory Visit: Payer: Self-pay | Admitting: Family Medicine

## 2021-12-02 DIAGNOSIS — I152 Hypertension secondary to endocrine disorders: Secondary | ICD-10-CM

## 2021-12-02 DIAGNOSIS — E1159 Type 2 diabetes mellitus with other circulatory complications: Secondary | ICD-10-CM

## 2021-12-08 ENCOUNTER — Telehealth: Payer: Self-pay | Admitting: Cardiovascular Disease

## 2021-12-08 MED ORDER — APIXABAN 5 MG PO TABS: 5.0000 mg | ORAL_TABLET | Freq: Two times a day (BID) | ORAL | 0 refills | Status: DC

## 2021-12-08 NOTE — Telephone Encounter (Signed)
Prescription refill request for Eliquis received. Indication: afib  Last office visit:[place], 06/10/2021 Scr: 1.61, 03/23/2021 Age: 81 yo  Weight: 77.3 kg   Refill sent.

## 2021-12-08 NOTE — Telephone Encounter (Signed)
°*  STAT* If patient is at the pharmacy, call can be transferred to refill team.   1. Which medications need to be refilled? (please list name of each medication and dose if known) apixaban (ELIQUIS) 5 MG TABS tablet  2. Which pharmacy/location (including street and city if local pharmacy) is medication to be sent to?   [place], [place]  3. Do they need a 30 day or 90 day supply? 90 day   Patient is completely out of medication. Patient's wife states [facility] is always late with his prescriptions and now he is out. She would like his refills to go to [facility] from now on.

## 2022-01-08 ENCOUNTER — Other Ambulatory Visit: Payer: Self-pay | Admitting: Family Medicine

## 2022-01-08 DIAGNOSIS — E1169 Type 2 diabetes mellitus with other specified complication: Secondary | ICD-10-CM

## 2022-02-19 ENCOUNTER — Other Ambulatory Visit: Payer: Self-pay | Admitting: Cardiovascular Disease

## 2022-02-19 NOTE — Telephone Encounter (Signed)
Prescription refill request for Eliquis received. ?Indication:Afib ?Last office visit:8/22 ?Scr:1.6 ?Age: 81 ?Weight:77.3 kg ? ?Prescription refilled ? ?

## 2022-02-22 ENCOUNTER — Other Ambulatory Visit: Payer: Self-pay

## 2022-02-22 ENCOUNTER — Ambulatory Visit (INDEPENDENT_AMBULATORY_CARE_PROVIDER_SITE_OTHER): Payer: MEDICARE | Admitting: Internal Medicine

## 2022-02-22 ENCOUNTER — Encounter: Payer: Self-pay | Admitting: Internal Medicine

## 2022-02-22 VITALS — BP 170/95 | HR 77 | Temp 97.9°F | Resp 16 | Wt 168.0 lb

## 2022-02-22 DIAGNOSIS — B20 Human immunodeficiency virus [HIV] disease: Secondary | ICD-10-CM

## 2022-02-22 DIAGNOSIS — I2510 Atherosclerotic heart disease of native coronary artery without angina pectoris: Secondary | ICD-10-CM

## 2022-02-22 DIAGNOSIS — Z23 Encounter for immunization: Secondary | ICD-10-CM

## 2022-02-22 DIAGNOSIS — Z79899 Other long term (current) drug therapy: Secondary | ICD-10-CM

## 2022-02-22 DIAGNOSIS — N183 Chronic kidney disease, stage 3 unspecified: Secondary | ICD-10-CM

## 2022-02-22 MED ORDER — TRIUMEQ 600-50-300 MG PO TABS: 1.0000 | ORAL_TABLET | Freq: Every day | ORAL | 11 refills | Status: DC

## 2022-02-22 NOTE — Progress Notes (Signed)
RFV: follow up annual visit for hiv disease Patient ID: Jeremy Reeves, male   DOB: 08/01/1941, 81 y.o.   MRN: [id number]  HPI 81yo M with hiv disease, well controlled on triumeq, CD 4 count of 459/VL<20 in June 2022.   Still working mowing lawns on 4 homes Decreased appetite (4# lbs since last year)  Wearing compression socks ROS: urgency, not frequency, and dysuria  Outpatient Encounter Medications as of 02/22/2022  Medication Sig   abacavir-dolutegravir-lamiVUDine (TRIUMEQ) 600-50-300 MG tablet Take 1 tablet by mouth daily.   apixaban (ELIQUIS) 5 MG TABS tablet Take 1 tablet (5 mg total) by mouth 2 (two) times daily.   carvedilol (COREG) 6.25 MG tablet TAKE 1 TABLET TWICE DAILY WITH A MEAL   Cinnamon 500 MG capsule Take 1,000 mg by mouth daily.   Cranberry 360 MG CAPS Take by mouth.   Multiple Vitamins-Minerals (MULTIVITAMIN WITH MINERALS) tablet Take 1 tablet by mouth daily.   niacin (NIASPAN) 1000 MG CR tablet TAKE 2 TABLETS (2,000 MG) AT BEDTIME. KEEP APPOINTMENT ON 06/10/21.   omeprazole (PRILOSEC) 20 MG capsule Take 1 capsule (20 mg total) by mouth daily.   pravastatin (PRAVACHOL) 40 MG tablet TAKE 1 TABLET EVERY DAY (OBTAIN FURTHER REFILLS FROM PRIMARY CARE PROVIDER)   vitamin E 400 UNIT capsule Take 400 Units by mouth daily.   No facility-administered encounter medications on file as of 02/22/2022.     Patient Active Problem List   Diagnosis Date Noted   Stage 3a chronic kidney disease ([place]) 04/09/2021   Atrial fibrillation ([place]) 08/02/2019   Postherpetic neuralgia 04/16/2015   GERD (gastroesophageal reflux disease) 07/05/2013   HIV disease ([place]) 05/24/2013   Hypertension associated with diabetes ([place]) 01/25/2013   ASHD (arteriosclerotic heart disease) 03/18/2011   BPH (benign prostatic hyperplasia) 03/18/2011   Hyperlipidemia associated with type 2 diabetes mellitus ([place]) 03/18/2011   Type 2 diabetes mellitus in remission ([place]) 03/18/2011     Health  Maintenance Due  Topic Date Due   Zoster Vaccines- Shingrix (1 of 2) Never done   OPHTHALMOLOGY EXAM  06/08/2016   URINE MICROALBUMIN  08/11/2016   COVID-19 Vaccine (4 - Booster for Moderna series) 10/26/2020   HEMOGLOBIN A1C  10/09/2021     Review of Systems  Physical Exam   BP (!) 170/95   Pulse 77   Temp 97.9 F (36.6 C) (Oral)   Resp 16   Wt 168 lb (76.2 kg)   SpO2 99%   BMI 27.12 kg/m   Physical Exam  Constitutional: He is oriented to person, place, and time. He appears well-developed and well-nourished. No distress.  HENT:  Mouth/Throat: Oropharynx is clear and moist. No oropharyngeal exudate.  Cardiovascular: Normal rate, regular rhythm and normal heart sounds. Exam reveals no gallop and no friction rub.  No murmur heard.  Pulmonary/Chest: Effort normal and breath sounds normal. No respiratory distress. He has no wheezes.  Abdominal: Soft. Bowel sounds are normal. He exhibits no distension. There is no tenderness.  Lymphadenopathy:  He has no cervical adenopathy.  Neurological: He is alert and oriented to person, place, and time.  Skin: Skin is warm and dry. No rash noted. No erythema.  Psychiatric: He has a normal mood and affect. His behavior is normal.   Lab Results  Component Value Date   CD4TCELL 40 03/23/2021   Lab Results  Component Value Date   CD4TABS 459 03/23/2021   CD4TABS 563 02/25/2020   CD4TABS 499 08/27/2019   Lab Results  Component  Value Date   HIV1RNAQUANT Not Detected 03/23/2021   Lab Results  Component Value Date   HEPBSAB NEG 05/24/2013   Lab Results  Component Value Date   LABRPR NON-REACTIVE 08/27/2019    CBC Lab Results  Component Value Date   WBC 4.8 03/23/2021   RBC 5.81 (H) 03/23/2021   HGB 16.6 03/23/2021   HCT 50.5 (H) 03/23/2021   PLT 98 (L) 03/23/2021   MCV 86.9 03/23/2021   MCH 28.6 03/23/2021   MCHC 32.9 03/23/2021   RDW 14.6 03/23/2021   LYMPHSABS 1,358 03/23/2021   MONOABS 495 04/05/2017   EOSABS 110  03/23/2021    BMET Lab Results  Component Value Date   NA 143 03/23/2021   K 4.0 03/23/2021   CL 106 03/23/2021   CO2 28 03/23/2021   GLUCOSE 99 03/23/2021   BUN 15 03/23/2021   CREATININE 1.61 (H) 03/23/2021   CALCIUM 9.2 03/23/2021   GFRNONAA 40 (L) 03/23/2021   GFRAA 47 (L) 03/23/2021      Assessment and Plan  HIV disease = will check labs; refill triumeq  CKD 3/long term medication management = will check cr to see that it is stable  Health maintenance = prevnar 20 vaccine today; needs bivalent-moderna booster #2 dose

## 2022-02-23 LAB — T-HELPER CELL (CD4) - (RCID CLINIC ONLY)
CD4 % Helper T Cell: 39 % (ref 33–65)
CD4 T Cell Abs: 513 /uL (ref 400–1790)

## 2022-02-25 LAB — CBC WITH DIFFERENTIAL/PLATELET
Absolute Monocytes: 549 cells/uL (ref 200–950)
Basophils Absolute: 20 cells/uL (ref 0–200)
Basophils Relative: 0.4 %
Eosinophils Absolute: 108 cells/uL (ref 15–500)
Eosinophils Relative: 2.2 %
HCT: 51.1 % — ABNORMAL HIGH (ref 38.5–50.0)
Hemoglobin: 16.9 g/dL (ref 13.2–17.1)
Lymphs Abs: 1441 cells/uL (ref 850–3900)
MCH: 29.3 pg (ref 27.0–33.0)
MCHC: 33.1 g/dL (ref 32.0–36.0)
MCV: 88.7 fL (ref 80.0–100.0)
MPV: 12.2 fL (ref 7.5–12.5)
Monocytes Relative: 11.2 %
Neutro Abs: 2783 cells/uL (ref 1500–7800)
Neutrophils Relative %: 56.8 %
Platelets: 82 10*3/uL — ABNORMAL LOW (ref 140–400)
RBC: 5.76 10*6/uL (ref 4.20–5.80)
RDW: 15.4 % — ABNORMAL HIGH (ref 11.0–15.0)
Total Lymphocyte: 29.4 %
WBC: 4.9 10*3/uL (ref 3.8–10.8)

## 2022-02-25 LAB — COMPLETE METABOLIC PANEL WITH GFR
AG Ratio: 1.5 (calc) (ref 1.0–2.5)
ALT: 54 U/L — ABNORMAL HIGH (ref 9–46)
AST: 47 U/L — ABNORMAL HIGH (ref 10–35)
Albumin: 4.1 g/dL (ref 3.6–5.1)
Alkaline phosphatase (APISO): 70 U/L (ref 35–144)
BUN/Creatinine Ratio: 12 (calc) (ref 6–22)
BUN: 18 mg/dL (ref 7–25)
CO2: 28 mmol/L (ref 20–32)
Calcium: 9.3 mg/dL (ref 8.6–10.3)
Chloride: 105 mmol/L (ref 98–110)
Creat: 1.49 mg/dL — ABNORMAL HIGH (ref 0.70–1.28)
Globulin: 2.7 g/dL (calc) (ref 1.9–3.7)
Glucose, Bld: 92 mg/dL (ref 65–99)
Potassium: 3.8 mmol/L (ref 3.5–5.3)
Sodium: 143 mmol/L (ref 135–146)
Total Bilirubin: 1.2 mg/dL (ref 0.2–1.2)
Total Protein: 6.8 g/dL (ref 6.1–8.1)
eGFR: 47 mL/min/{1.73_m2} — ABNORMAL LOW (ref >=60)

## 2022-02-25 LAB — HIV-1 RNA QUANT-NO REFLEX-BLD
HIV 1 RNA Quant: 24 copies/mL — ABNORMAL HIGH
HIV-1 RNA Quant, Log: 1.38 Log copies/mL — ABNORMAL HIGH

## 2022-03-10 ENCOUNTER — Telehealth: Payer: Self-pay

## 2022-03-10 DIAGNOSIS — B20 Human immunodeficiency virus [HIV] disease: Secondary | ICD-10-CM

## 2022-03-10 MED ORDER — TRIUMEQ 600-50-300 MG PO TABS: 1.0000 | ORAL_TABLET | Freq: Every day | ORAL | 11 refills | Status: DC

## 2022-03-10 NOTE — Telephone Encounter (Signed)
-----   Message from Roney Jaffe, CPhT sent at 03/10/2022  9:14 AM EDT ----- Regarding: Jeremy Reeves ,  This patient called and wanted his Triumeq script transferred to [facility] on [address] . If you can send his script there please.   Thank You,  Ileene Patrick, [place] Patient [facility] for Infectious Disease Phone: [phone] Fax: [phone]

## 2022-03-10 NOTE — Telephone Encounter (Signed)
Closed out Triumeq at [facility] and resent to [facility] on [address] per patient request.   Beryle Flock, RN

## 2022-04-08 ENCOUNTER — Other Ambulatory Visit: Payer: Self-pay | Admitting: Cardiovascular Disease

## 2022-04-14 ENCOUNTER — Encounter: Payer: Self-pay | Admitting: Family Medicine

## 2022-04-14 ENCOUNTER — Ambulatory Visit (INDEPENDENT_AMBULATORY_CARE_PROVIDER_SITE_OTHER): Payer: MEDICARE | Admitting: Family Medicine

## 2022-04-14 VITALS — BP 154/96 | HR 75 | Temp 96.8°F | Ht 65.0 in | Wt 154.0 lb

## 2022-04-14 DIAGNOSIS — I152 Hypertension secondary to endocrine disorders: Secondary | ICD-10-CM | POA: Diagnosis not present

## 2022-04-14 DIAGNOSIS — E119 Type 2 diabetes mellitus without complications: Secondary | ICD-10-CM

## 2022-04-14 DIAGNOSIS — I2510 Atherosclerotic heart disease of native coronary artery without angina pectoris: Secondary | ICD-10-CM

## 2022-04-14 DIAGNOSIS — N1831 Chronic kidney disease, stage 3a: Secondary | ICD-10-CM | POA: Diagnosis not present

## 2022-04-14 DIAGNOSIS — I4811 Longstanding persistent atrial fibrillation: Secondary | ICD-10-CM | POA: Diagnosis not present

## 2022-04-14 DIAGNOSIS — E1169 Type 2 diabetes mellitus with other specified complication: Secondary | ICD-10-CM

## 2022-04-14 DIAGNOSIS — E1159 Type 2 diabetes mellitus with other circulatory complications: Secondary | ICD-10-CM

## 2022-04-14 DIAGNOSIS — Z8619 Personal history of other infectious and parasitic diseases: Secondary | ICD-10-CM | POA: Diagnosis not present

## 2022-04-14 DIAGNOSIS — Z0000 Encounter for general adult medical examination without abnormal findings: Secondary | ICD-10-CM

## 2022-04-14 DIAGNOSIS — E785 Hyperlipidemia, unspecified: Secondary | ICD-10-CM | POA: Diagnosis not present

## 2022-04-14 DIAGNOSIS — B20 Human immunodeficiency virus [HIV] disease: Secondary | ICD-10-CM

## 2022-04-14 DIAGNOSIS — K219 Gastro-esophageal reflux disease without esophagitis: Secondary | ICD-10-CM | POA: Diagnosis not present

## 2022-04-14 DIAGNOSIS — N400 Enlarged prostate without lower urinary tract symptoms: Secondary | ICD-10-CM

## 2022-04-14 LAB — POCT GLYCOSYLATED HEMOGLOBIN (HGB A1C): Hemoglobin A1C: 6 % — AB (ref 4.0–5.6)

## 2022-04-14 MED ORDER — PRAVASTATIN SODIUM 40 MG PO TABS: ORAL_TABLET | ORAL | 3 refills | Status: DC

## 2022-04-14 NOTE — Patient Instructions (Signed)
Health Maintenance, Male Adopting a healthy lifestyle and getting preventive care are important in promoting health and wellness. Ask your health care provider about: The right schedule for you to have regular tests and exams. Things you can do on your own to prevent diseases and keep yourself healthy. What should I know about diet, weight, and exercise? Eat a healthy diet  Eat a diet that includes plenty of vegetables, fruits, low-fat dairy products, and lean protein. Do not eat a lot of foods that are high in solid fats, added sugars, or sodium. Maintain a healthy weight Body mass index (BMI) is a measurement that can be used to identify possible weight problems. It estimates body fat based on height and weight. Your health care provider can help determine your BMI and help you achieve or maintain a healthy weight. Get regular exercise Get regular exercise. This is one of the most important things you can do for your health. Most adults should: Exercise for at least 150 minutes each week. The exercise should increase your heart rate and make you sweat (moderate-intensity exercise). Do strengthening exercises at least twice a week. This is in addition to the moderate-intensity exercise. Spend less time sitting. Even light physical activity can be beneficial. Watch cholesterol and blood lipids Have your blood tested for lipids and cholesterol at 80 years of age, then have this test every 5 years. You may need to have your cholesterol levels checked more often if: Your lipid or cholesterol levels are high. You are older than 80 years of age. You are at high risk for heart disease. What should I know about cancer screening? Many types of cancers can be detected early and may often be prevented. Depending on your health history and family history, you may need to have cancer screening at various ages. This may include screening for: Colorectal cancer. Prostate cancer. Skin cancer. Lung  cancer. What should I know about heart disease, diabetes, and high blood pressure? Blood pressure and heart disease High blood pressure causes heart disease and increases the risk of stroke. This is more likely to develop in people who have high blood pressure readings or are overweight. Talk with your health care provider about your target blood pressure readings. Have your blood pressure checked: Every 3-5 years if you are 18-39 years of age. Every year if you are 40 years old or older. If you are between the ages of 65 and 75 and are a current or former smoker, ask your health care provider if you should have a one-time screening for abdominal aortic aneurysm (AAA). Diabetes Have regular diabetes screenings. This checks your fasting blood sugar level. Have the screening done: Once every three years after age 45 if you are at a normal weight and have a low risk for diabetes. More often and at a younger age if you are overweight or have a high risk for diabetes. What should I know about preventing infection? Hepatitis B If you have a higher risk for hepatitis B, you should be screened for this virus. Talk with your health care provider to find out if you are at risk for hepatitis B infection. Hepatitis C Blood testing is recommended for: Everyone born from 1945 through 1965. Anyone with known risk factors for hepatitis C. Sexually transmitted infections (STIs) You should be screened each year for STIs, including gonorrhea and chlamydia, if: You are sexually active and are younger than 80 years of age. You are older than 80 years of age and your   health care provider tells you that you are at risk for this type of infection. Your sexual activity has changed since you were last screened, and you are at increased risk for chlamydia or gonorrhea. Ask your health care provider if you are at risk. Ask your health care provider about whether you are at high risk for HIV. Your health care provider  may recommend a prescription medicine to help prevent HIV infection. If you choose to take medicine to prevent HIV, you should first get tested for HIV. You should then be tested every 3 months for as long as you are taking the medicine. Follow these instructions at home: Alcohol use Do not drink alcohol if your health care provider tells you not to drink. If you drink alcohol: Limit how much you have to 0-2 drinks a day. Know how much alcohol is in your drink. In the [place] one drink equals one 12 oz bottle of beer (355 mL), one 5 oz glass of wine (148 mL), or one 1 oz glass of hard liquor (44 mL). Lifestyle Do not use any products that contain nicotine or tobacco. These products include cigarettes, chewing tobacco, and vaping devices, such as e-cigarettes. If you need help quitting, ask your health care provider. Do not use street drugs. Do not share needles. Ask your health care provider for help if you need support or information about quitting drugs. General instructions Schedule regular health, dental, and eye exams. Stay current with your vaccines. Tell your health care provider if: You often feel depressed. You have ever been abused or do not feel safe at home. Summary Adopting a healthy lifestyle and getting preventive care are important in promoting health and wellness. Follow your health care provider's instructions about healthy diet, exercising, and getting tested or screened for diseases. Follow your health care provider's instructions on monitoring your cholesterol and blood pressure. This information is not intended to replace advice given to you by your health care provider. Make sure you discuss any questions you have with your health care provider. Document Revised: 02/23/2021 Document Reviewed: 02/23/2021 Elsevier Patient Education  2023 Elsevier Inc.  

## 2022-04-14 NOTE — Progress Notes (Signed)
Jeremy Reeves is a 81 y.o. male who presents for annual wellness visit and follow-up on chronic medical conditions.  He has no particular concerns or complaints.  His immunizations were reviewed and he is not interested in having the shingles vaccine.  He continues to be followed by infectious disease and is having no difficulty there.  He does take Triumeq.  He does have underlying ASHD with atrial fibs and follows up regularly with cardiology.  His most recent hemoglobin A1c was below 6.5.  He does have CKD stage III.  He is taking Prilosec for his reflux disease which has this under good control.  Does have a previous history of shingles and did have some postherpetic neuralgia but is not having any difficulty at the present time.  He does continue on niacin as well as pravastatin.  He has no aches or pains with that.  Does have a history of BPH but presently is having only occasional difficulty with urgency.     Immunizations and Health Maintenance Immunization History  Administered Date(s) Administered   Fluad Quad(high Dose 65+) 10/16/2020   Hepatitis B, adult 12/10/2013, 01/07/2014, 07/31/2014   Influenza Split 09/08/2011, 08/15/2012   Influenza, High Dose Seasonal PF 08/12/2015, 08/18/2016, 08/01/2017, 07/18/2018   Influenza,inj,Quad PF,6+ Mos 06/08/2013, 07/31/2014, 07/30/2019   Moderna Sars-Covid-2 Vaccination 11/13/2019, 12/11/2019, 08/31/2020   PNEUMOCOCCAL CONJUGATE-20 02/22/2022   Pneumococcal Conjugate-13 08/12/2015   Pneumococcal Polysaccharide-23 01/12/2008, 08/21/2013   Td 12/04/2015   Tdap 01/12/2008, 07/10/2018   Zoster, Live 12/19/2009   Health Maintenance Due  Topic Date Due   Zoster Vaccines- Shingrix (1 of 2) Never done   OPHTHALMOLOGY EXAM  06/08/2016   URINE MICROALBUMIN  08/11/2016   COVID-19 Vaccine (4 - Booster for Moderna series) 10/26/2020   FOOT EXAM  04/09/2022    Last colonoscopy: 09/23/11 Dr. Oletta Lamas aged out Last PSA: N/A Dentist:Q three  months Ophtho: Q year Exercise: walking three days a week  Other doctors caring for patient include: Dr. Oletta Lamas GI           [name]           [name] Cardiology               Advanced Directives: Does Patient Have a Medical Advance Directive?: Yes Type of Advance Directive: Living will, Healthcare Power of Attorney Does patient want to make changes to medical advance directive?: No - Patient declined [place] in Chart?: Yes - validated most recent copy scanned in chart (See row information)  Depression screen:  See questionnaire below.        04/14/2022    9:43 AM 02/22/2022   10:58 AM 04/09/2021    2:31 PM 03/23/2021   10:57 AM 03/05/2020   11:11 AM  Depression screen PHQ 2/9  Decreased Interest 0 0 0 0 0  Down, Depressed, Hopeless 0 0 0 0 0  PHQ - 2 Score 0 0 0 0 0    Fall Screen: See Questionaire below.      04/14/2022    9:43 AM 02/22/2022   10:58 AM 04/09/2021    2:31 PM 03/23/2021   10:57 AM 03/05/2020   11:11 AM  Fall Risk   Falls in the past year? 0 0 0 0 0  Number falls in past yr: 0 0 0    Injury with Fall? 0 0 0    Risk for fall due to : No Fall Risks No Fall Risks  No Fall Risks History of fall(s)   Follow up Falls evaluation completed Falls evaluation completed Falls evaluation completed Falls evaluation completed     ADL screen:  See questionnaire below.  Functional Status Survey: Is the patient deaf or have difficulty hearing?: No Does the patient have difficulty seeing, even when wearing glasses/contacts?: No Does the patient have difficulty concentrating, remembering, or making decisions?: No Does the patient have difficulty walking or climbing stairs?: No Does the patient have difficulty dressing or bathing?: No Does the patient have difficulty doing errands alone such as visiting a doctor's office or shopping?: No   Review of Systems  Constitutional: -, -unexpected weight change, -anorexia, -fatigue Allergy:  -sneezing, -itching, -congestion Dermatology: denies changing moles, rash, lumps ENT: -runny nose, -ear pain, -sore throat,  Cardiology:  -chest pain, -palpitations, -orthopnea, Respiratory: -cough, -shortness of breath, -dyspnea on exertion, -wheezing,  Gastroenterology: -abdominal pain, -nausea, -vomiting, -diarrhea, -constipation, -dysphagia Hematology: -bleeding or bruising problems Musculoskeletal: -arthralgias, -myalgias, -joint swelling, -back pain, - Ophthalmology: -vision changes,  Urology: -dysuria, -difficulty urinating,  -urinary frequency, -urgency, incontinence Neurology: -, -numbness, , -memory loss, -falls, -dizziness    PHYSICAL EXAM:  BP (!) 154/96   Pulse 75   Temp (!) 96.8 F (36 C)   Ht '5\' 5"'$  (1.651 m)   Wt 154 lb (69.9 kg)   SpO2 100%   BMI 25.63 kg/m   General Appearance: Alert, cooperative, no distress, appears stated age Head: Normocephalic, without obvious abnormality, atraumatic Eyes: PERRL, conjunctiva/corneas clear, EOM's intact, Ears: Normal TM's and external ear canals Nose: Nares normal, mucosa normal, no drainage or sinus   tenderness Throat: Lips, mucosa, and tongue normal; teeth and gums normal Neck: Supple, no lymphadenopathy, thyroid:no enlargement/tenderness/nodules; no carotid bruit or JVD Lungs: Clear to auscultation bilaterally without wheezes, rales or ronchi; respirations unlabored Heart: Regular rate and rhythm, S1 and S2 normal, no murmur, rub or gallop Abdomen: Soft, non-tender, nondistended, normoactive bowel sounds, no masses, no hepatosplenomegaly Skin: Skin color, texture, turgor normal, no rashes or lesions Lymph nodes: Cervical, supraclavicular, and axillary nodes normal Neurologic: CNII-XII intact, normal strength, sensation and gait; reflexes 2+ and symmetric throughout   Psych: Normal mood, affect, hygiene and grooming Hemoglobin A1c is 6.0 ASSESSMENT/PLAN:   Routine general medical examination at a health care  facility  ASHD (arteriosclerotic heart disease)  Longstanding persistent atrial fibrillation (HCC)  Hypertension associated with diabetes (HCC)  Gastroesophageal reflux disease, unspecified whether esophagitis present  Hyperlipidemia associated with type 2 diabetes mellitus ([place]) - Plan: Lipid panel, pravastatin (PRAVACHOL) 40 MG tablet  Type 2 diabetes mellitus in remission ([name]) - Plan: POCT glycosylated hemoglobin (Hb A1C), CANCELED: POCT UA - Microalbumin  Benign prostatic hyperplasia without lower urinary tract symptoms  Stage 3a chronic kidney disease ([place])  HIV disease ([place])  History of shingles  Discussed getting the Shingrix vaccine again but he is not interested.  Explained that at this time there is no need for further recommendations regarding diabetes.  We will continue to monitor this.  Discussed  healthy diet . Immunization recommendations discussed.  Colonoscopy recommendations reviewed.   Medicare Attestation I have personally reviewed: The patient's medical and social history Their use of alcohol, tobacco or illicit drugs Their current medications and supplements The patient's functional ability including ADLs,fall risks, home safety risks, cognitive, and hearing and visual impairment Diet and physical activities Evidence for depression or mood disorders  The patient's weight, height, and BMI have been recorded in the chart.  I have made referrals, counseling,  and provided education to the patient based on review of the above and I have provided the patient with a written personalized care plan for preventive services.     Jill Alexanders, MD   04/14/2022

## 2022-04-15 LAB — LIPID PANEL
Chol/HDL Ratio: 2.3 ratio (ref 0.0–5.0)
Cholesterol, Total: 149 mg/dL (ref 100–199)
HDL: 64 mg/dL (ref >39)
LDL Chol Calc (NIH): 75 mg/dL (ref 0–99)
Triglycerides: 44 mg/dL (ref 0–149)
VLDL Cholesterol Cal: 10 mg/dL (ref 5–40)

## 2022-04-19 ENCOUNTER — Telehealth: Payer: Self-pay | Admitting: Family Medicine

## 2022-04-19 NOTE — Telephone Encounter (Signed)
Pt wife Meredith Mody says she got your vm about Roney's results but asks if you can give her a call, she has some questions. Best number to reach her at [phone].

## 2022-04-21 NOTE — Telephone Encounter (Signed)
Lvm for pt wife to call back. [place]

## 2022-04-23 NOTE — Telephone Encounter (Signed)
Pt called to request lab result to be mailed . Place in box to go out Monday.

## 2022-04-26 ENCOUNTER — Other Ambulatory Visit: Payer: Self-pay | Admitting: Cardiovascular Disease

## 2022-04-29 ENCOUNTER — Encounter: Payer: Self-pay | Admitting: Family Medicine

## 2022-06-22 ENCOUNTER — Encounter: Payer: Self-pay | Admitting: Cardiovascular Disease

## 2022-06-22 ENCOUNTER — Ambulatory Visit: Payer: MEDICARE | Attending: Cardiovascular Disease | Admitting: Cardiovascular Disease

## 2022-06-22 DIAGNOSIS — E785 Hyperlipidemia, unspecified: Secondary | ICD-10-CM | POA: Insufficient documentation

## 2022-06-22 DIAGNOSIS — E1169 Type 2 diabetes mellitus with other specified complication: Secondary | ICD-10-CM | POA: Insufficient documentation

## 2022-06-22 DIAGNOSIS — I2510 Atherosclerotic heart disease of native coronary artery without angina pectoris: Secondary | ICD-10-CM | POA: Insufficient documentation

## 2022-06-22 DIAGNOSIS — E1159 Type 2 diabetes mellitus with other circulatory complications: Secondary | ICD-10-CM | POA: Insufficient documentation

## 2022-06-22 DIAGNOSIS — I4819 Other persistent atrial fibrillation: Secondary | ICD-10-CM | POA: Insufficient documentation

## 2022-06-22 DIAGNOSIS — I152 Hypertension secondary to endocrine disorders: Secondary | ICD-10-CM | POA: Insufficient documentation

## 2022-06-22 NOTE — Assessment & Plan Note (Signed)
History of atrial fibrillation rate controlled on Eliquis oral anticoagulation.

## 2022-06-22 NOTE — Assessment & Plan Note (Signed)
History of hyperlipidemia on statin therapy with lipid profile performed 04/14/2022 revealing total cholesterol 149, LDL 75 and HDL of 64.

## 2022-06-22 NOTE — Assessment & Plan Note (Signed)
History of essential hypertension a blood pressure measured today at 160/96.  He checks his blood pressure at home and this morning was 127/66.  He is on carvedilol.

## 2022-06-22 NOTE — Patient Instructions (Signed)
Medication Instructions:  Your physician recommends that you continue on your current medications as directed. Please refer to the Current Medication list given to you today.  *If you need a refill on your cardiac medications before your next appointment, please call your pharmacy*   Follow-Up: At [facility], you and your health needs are our priority.  As part of our continuing mission to provide you with exceptional heart care, we have created designated Provider Care Teams.  These Care Teams include your primary Cardiologist (physician) and Advanced Practice Providers (APPs -  Physician Assistants and Nurse Practitioners) who all work together to provide you with the care you need, when you need it.  We recommend signing up for the patient portal called "MyChart".  Sign up information is provided on this After Visit Summary.  MyChart is used to connect with patients for Virtual Visits (Telemedicine).  Patients are able to view lab/test results, encounter notes, upcoming appointments, etc.  Non-urgent messages can be sent to your provider as well.   To learn more about what you can do with MyChart, go to [email or web address].    Your next appointment:   12 month(s)  The format for your next appointment:   In Person  Provider:   Jonathan Berry, MD   

## 2022-06-22 NOTE — Assessment & Plan Note (Signed)
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  Myoview performed in February 2013 was nonischemic.  He is very active and denies chest pain or shortness of breath.

## 2022-06-22 NOTE — Progress Notes (Signed)
06/22/2022 NICOLI NARDOZZI   [birth year]  [id number]  Primary Physician Denita Lung, MD Primary Cardiologist: Lorretta Harp MD FACP, [place], [place], [place]  HPI:  Jeremy Reeves is a 81 y.o.  moderately overweight, married [place] American male, father of 2, grandfather to 3 grandchildren who I last saw in the office 06/06/2021.Marland Kitchen  He is accompanied by his wife Meredith Mody  today.  He has a history of ischemic heart disease status post coronary artery bypass grafting in 1999 with a LIMA to his LAD, a vein to an obtuse marginal branch and to the RCA. Myoview performed February 2013 was nonischemic.    Since I saw him 2 years ago he continues to do well.  He is very active and recently cut 4 acres of land and did weed eating without symptoms.  He remains on Eliquis for his A-fib.   Current Meds  Medication Sig   abacavir-dolutegravir-lamiVUDine (TRIUMEQ) 600-50-300 MG tablet Take 1 tablet by mouth daily.   apixaban (ELIQUIS) 5 MG TABS tablet Take 1 tablet (5 mg total) by mouth 2 (two) times daily.   carvedilol (COREG) 6.25 MG tablet TAKE 1 TABLET TWICE DAILY WITH A MEAL   Cinnamon 500 MG capsule Take 1,000 mg by mouth daily.   Cranberry 360 MG CAPS Take by mouth.   Multiple Vitamins-Minerals (MULTIVITAMIN WITH MINERALS) tablet Take 1 tablet by mouth daily.   niacin (NIASPAN) 1000 MG CR tablet TAKE 2 TABLETS (2,000 MG) AT BEDTIME.   omeprazole (PRILOSEC) 20 MG capsule TAKE 1 CAPSULE EVERY DAY   pravastatin (PRAVACHOL) 40 MG tablet TAKE 1 TABLET EVERY DAY (OBTAIN FURTHER REFILLS FROM PRIMARY CARE PROVIDER)   vitamin E 400 UNIT capsule Take 400 Units by mouth daily.     Allergies  Allergen Reactions   Peanut-Containing Drug Products Swelling    Social History   Socioeconomic History   Marital status: Married    Spouse name: Not on file   Number of children: Not on file   Years of education: Not on file   Highest education level: Not on file  Occupational History   Not on file   Tobacco Use   Smoking status: Former    Packs/day: 0.12    Years: 40.00    Total pack years: 4.80    Types: Cigarettes   Smokeless tobacco: Never   Tobacco comments:    07/04/2013 "I quit smoking before 2000"  Vaping Use   Vaping Use: Never used  Substance and Sexual Activity   Alcohol use: No   Drug use: No   Sexual activity: Not Currently    Partners: Female    Comment: declined condoms  Other Topics Concern   Not on file  Social History Narrative   Not on file   Social Determinants of Health   Financial Resource Strain: Not on file  Food Insecurity: Not on file  Transportation Needs: Not on file  Physical Activity: Not on file  Stress: Not on file  Social Connections: Not on file  Intimate Partner Violence: Not on file     Review of Systems: General: negative for chills, fever, night sweats or weight changes.  Cardiovascular: negative for chest pain, dyspnea on exertion, edema, orthopnea, palpitations, paroxysmal nocturnal dyspnea or shortness of breath Dermatological: negative for rash Respiratory: negative for cough or wheezing Urologic: negative for hematuria Abdominal: negative for nausea, vomiting, diarrhea, bright red blood per rectum, melena, or hematemesis Neurologic: negative for visual changes, syncope, or dizziness All  other systems reviewed and are otherwise negative except as noted above.    Blood pressure (!) 160/96, pulse 75, height '5\' 7"'$  (1.702 m), weight 154 lb (69.9 kg), SpO2 98 %.  General appearance: alert and no distress Neck: no adenopathy, no carotid bruit, no JVD, supple, symmetrical, trachea midline, and thyroid not enlarged, symmetric, no tenderness/mass/nodules Lungs: clear to auscultation bilaterally Heart: irregularly irregular rhythm Extremities: extremities normal, atraumatic, no cyanosis or edema Pulses: 2+ and symmetric Skin: Skin color, texture, turgor normal. No rashes or lesions Neurologic: Grossly normal  EKG atrial  fibrillation with a ventricular sponsor of 75 a right bundle branch block with left axis deviation.  I personally reviewed this EKG.  ASSESSMENT AND PLAN:   ASHD (arteriosclerotic heart disease) History of CAD status post coronary artery bypass grafting in 1999 with a LIMA to his LAD, vein to obtuse marginal branch and to the RCA.  Myoview performed in February 2013 was nonischemic.  He is very active and denies chest pain or shortness of breath.  Hyperlipidemia associated with type 2 diabetes mellitus ([place]) History of hyperlipidemia on statin therapy with lipid profile performed 04/14/2022 revealing total cholesterol 149, LDL 75 and HDL of 64.  Hypertension associated with diabetes ([place]) History of essential hypertension a blood pressure measured today at 160/96.  He checks his blood pressure at home and this morning was 127/66.  He is on carvedilol.  Persistent atrial fibrillation (HCC) History of atrial fibrillation rate controlled on Eliquis oral anticoagulation.     Lorretta Harp MD FACP,FACC,FAHA, [facility] 06/22/2022 10:22 AM

## 2022-07-12 ENCOUNTER — Ambulatory Visit: Payer: MEDICARE | Admitting: Family Medicine

## 2022-07-12 ENCOUNTER — Encounter: Payer: Self-pay | Admitting: Family Medicine

## 2022-07-12 ENCOUNTER — Ambulatory Visit (INDEPENDENT_AMBULATORY_CARE_PROVIDER_SITE_OTHER): Payer: MEDICARE | Admitting: Family Medicine

## 2022-07-12 VITALS — BP 124/84 | HR 84 | Temp 98.5°F | Wt 152.4 lb

## 2022-07-12 DIAGNOSIS — J209 Acute bronchitis, unspecified: Secondary | ICD-10-CM | POA: Diagnosis not present

## 2022-07-12 DIAGNOSIS — I2510 Atherosclerotic heart disease of native coronary artery without angina pectoris: Secondary | ICD-10-CM | POA: Diagnosis not present

## 2022-07-12 MED ORDER — AMOXICILLIN 875 MG PO TABS: 875.0000 mg | ORAL_TABLET | Freq: Two times a day (BID) | ORAL | 0 refills | Status: DC

## 2022-07-12 NOTE — Progress Notes (Signed)
   Subjective:    Patient ID: Jeremy Reeves, male    DOB: 05/27/1941, 82 y.o.   MRN: [id number]  HPI He states that 3 days ago he developed a dry cough that over the next 24 hours became productive but no sore throat, earache, shortness of breath, fever malaise or fatigue.  He did test for COVID and was negative.   Review of Systems     Objective:   Physical Exam  Alert and in no distress. Tympanic membranes and canals are normal. Pharyngeal area is normal. Neck is supple without adenopathy or thyromegaly. Cardiac exam shows a regular sinus rhythm without murmurs or gallops. Lungs are clear to auscultation.       Assessment & Plan:  Acute bronchitis, unspecified organism - Plan: amoxicillin (AMOXIL) 875 MG tablet, DISCONTINUED: amoxicillin (AMOXIL) 875 MG tablet Since he developed a productive cough relatively quickly I think it is reasonable to place him on a an antibiotic.  He was comfortable with that.

## 2022-07-13 ENCOUNTER — Other Ambulatory Visit (HOSPITAL_COMMUNITY): Payer: Self-pay

## 2022-07-19 ENCOUNTER — Telehealth: Payer: Self-pay

## 2022-07-19 ENCOUNTER — Other Ambulatory Visit (HOSPITAL_COMMUNITY): Payer: Self-pay

## 2022-07-19 NOTE — Telephone Encounter (Signed)
[facility] Patient Advocate Encounter   I was successful in securing patient a $7500.00 grant from [employer] (PA[employer]) to provide copayment coverage for Triumeq.  This will make the out of pocket cost $0.00.     I have spoken with the patient.        Patient knows to call the office with questions or concerns.  Ileene Patrick, [facility] Patient [facility] Phone: [phone] Fax:  [phone]

## 2022-08-01 ENCOUNTER — Other Ambulatory Visit: Payer: Self-pay | Admitting: Family Medicine

## 2022-08-01 DIAGNOSIS — I152 Hypertension secondary to endocrine disorders: Secondary | ICD-10-CM

## 2022-08-16 ENCOUNTER — Ambulatory Visit (INDEPENDENT_AMBULATORY_CARE_PROVIDER_SITE_OTHER): Payer: MEDICARE | Admitting: Family Medicine

## 2022-08-16 ENCOUNTER — Encounter: Payer: Self-pay | Admitting: Family Medicine

## 2022-08-16 VITALS — BP 124/80 | HR 68 | Temp 98.3°F | Wt 151.0 lb

## 2022-08-16 DIAGNOSIS — N3941 Urge incontinence: Secondary | ICD-10-CM

## 2022-08-16 DIAGNOSIS — I2510 Atherosclerotic heart disease of native coronary artery without angina pectoris: Secondary | ICD-10-CM

## 2022-08-16 MED ORDER — MIRABEGRON ER 25 MG PO TB24: 25.0000 mg | ORAL_TABLET | Freq: Every day | ORAL | 0 refills | Status: DC

## 2022-08-16 NOTE — Progress Notes (Signed)
   Subjective:    Patient ID: Jeremy Reeves, male    DOB: Jul 27, 1941, 81 y.o.   MRN: [id number]  HPI He complains of a several month history of having urge incontinence.  No burning, stinging, hesitancy.   Review of Systems     Objective:   Physical Exam Abdominal exam shows no masses or tenderness.  Urine not checked due to inability to produce 1       Assessment & Plan:  Urge incontinence - Plan: mirabegron ER (MYRBETRIQ) 25 MG TB24 tablet He is to bring a urine specimen back in the next day or so if so I can check to make sure no infection.

## 2022-08-18 ENCOUNTER — Other Ambulatory Visit (INDEPENDENT_AMBULATORY_CARE_PROVIDER_SITE_OTHER): Payer: MEDICARE

## 2022-08-18 DIAGNOSIS — N3941 Urge incontinence: Secondary | ICD-10-CM

## 2022-08-18 LAB — POCT URINALYSIS DIP (PROADVANTAGE DEVICE)
Bilirubin, UA: NEGATIVE
Glucose, UA: NEGATIVE mg/dL
Ketones, POC UA: NEGATIVE mg/dL
Leukocytes, UA: NEGATIVE
Nitrite, UA: NEGATIVE
Protein Ur, POC: NEGATIVE mg/dL
Specific Gravity, Urine: 1.02
Urobilinogen, Ur: NEGATIVE
pH, UA: 6 (ref 5.0–8.0)

## 2022-08-24 DIAGNOSIS — Z23 Encounter for immunization: Secondary | ICD-10-CM | POA: Diagnosis not present

## 2022-09-03 ENCOUNTER — Telehealth: Payer: Self-pay

## 2022-09-03 DIAGNOSIS — N3941 Urge incontinence: Secondary | ICD-10-CM

## 2022-09-03 NOTE — Telephone Encounter (Signed)
I called thew pts. Wife to move his apt for next year and she wanted to know if you wanted him to stay on Mybertiq or not. If you do he will need it sent in to [facility] on [address]. She said it seems to help some. I told her you are out of the office until Monday and you would look at it then and she said that was fine.

## 2022-09-06 MED ORDER — MIRABEGRON ER 25 MG PO TB24: 25.0000 mg | ORAL_TABLET | Freq: Every day | ORAL | 5 refills | Status: DC

## 2022-09-29 ENCOUNTER — Other Ambulatory Visit: Payer: Self-pay | Admitting: Cardiovascular Disease

## 2022-09-29 DIAGNOSIS — I4819 Other persistent atrial fibrillation: Secondary | ICD-10-CM

## 2022-09-29 NOTE — Telephone Encounter (Signed)
Eliquis '5mg'$  refill request received. Patient is 81 years old, weight-68.5kg, Crea-1.49 on 02/22/2022, Diagnosis-Afib, and last seen by [name] on 06/22/2022. Dose is appropriate based on dosing criteria. Will send in refill to requested pharmacy.

## 2023-01-06 ENCOUNTER — Ambulatory Visit: Payer: Self-pay

## 2023-01-06 NOTE — Patient Instructions (Signed)
Visit Information  Thank you for taking time to visit with me today. Please don't hesitate to contact me if I can be of assistance to you.   Following are the goals we discussed today:   Goals Addressed             This Visit's Progress    COMPLETED: Care Coordination Activities       Care Coordination Interventions: SDoH screening performed - no acute resource challenges identified at this time Determined the patient does not have concerns with medication costs and/or adherence at this time Reviewed future appointments with patients spouse Education provided on the role of the care coordination team - no follow up desired at this time Instructed to contact patients primary care provider as needed         If you are experiencing a Mental Health or [place] or need someone to talk to, please call 911  The patient verbalized understanding of instructions, educational materials, and care plan provided today and DECLINED offer to receive copy of patient instructions, educational materials, and care plan.   No further follow up required: Please contact your primary care provider as needed  Daneen Schick, Arita Miss, CDP Social Worker, Certified Dementia Practitioner [facility] Coordination [phone]

## 2023-01-06 NOTE — Patient Outreach (Signed)
  Care Coordination   Initial Visit Note   01/06/2023 Name: Jeremy Reeves MRN: [id number] DOB: 12/18/1940  Jeremy Reeves is a 82 y.o. year old male who sees [employer], Elyse Jarvis, MD for primary care. I  spoke with patients spouse by phone to introduce care coordination services.  What matters to the patients health and wellness today?  Ongoing incontinence     Goals Addressed             This Visit's Progress    COMPLETED: Care Coordination Activities       Care Coordination Interventions: SDoH screening performed - no acute resource challenges identified at this time Determined the patient does not have concerns with medication costs and/or adherence at this time Reviewed future appointments with patients spouse Education provided on the role of the care coordination team - no follow up desired at this time Instructed to contact patients primary care provider as needed         SDOH assessments and interventions completed:  Yes  SDOH Interventions Today    Flowsheet Row Most Recent Value  SDOH Interventions   Food Insecurity Interventions Intervention Not Indicated  Housing Interventions Intervention Not Indicated  Transportation Interventions Intervention Not Indicated  Utilities Interventions Intervention Not Indicated        Care Coordination Interventions:  Yes, provided   Interventions Today    Flowsheet Row Most Recent Value  Chronic Disease   Chronic disease during today's visit Diabetes, Hypertension (HTN)  General Interventions   General Interventions Discussed/Reviewed General Interventions Discussed, Doctor Visits  Doctor Visits Discussed/Reviewed Doctor Visits Reviewed  Education Interventions   Education Provided Provided Education  Provided Verbal Education On Other  [role of care coordination team]        Follow up plan: No further intervention required.   Encounter Outcome:  Pt. Visit Completed   Daneen Schick, BSW, CDP Social  Worker, Certified Dementia Practitioner [facility]  Care Coordination [phone]

## 2023-01-26 ENCOUNTER — Other Ambulatory Visit: Payer: Self-pay | Admitting: Cardiovascular Disease

## 2023-02-17 ENCOUNTER — Other Ambulatory Visit: Payer: Self-pay | Admitting: Internal Medicine

## 2023-02-17 DIAGNOSIS — B20 Human immunodeficiency virus [HIV] disease: Secondary | ICD-10-CM

## 2023-02-23 ENCOUNTER — Other Ambulatory Visit: Payer: Self-pay | Admitting: Cardiovascular Disease

## 2023-02-23 DIAGNOSIS — I4819 Other persistent atrial fibrillation: Secondary | ICD-10-CM

## 2023-02-23 NOTE — Telephone Encounter (Signed)
Prescription refill request for Eliquis received. Indication: Afib  Last office visit: 06/22/22 Allyson Sabal)  Scr: 1.49 (02/22/22)  Age: 82 Weight: 68.5kg  Appropriate dose at this time. Limited refill sent d/t the need for possible dose change at next refill.

## 2023-03-09 ENCOUNTER — Other Ambulatory Visit: Payer: Self-pay | Admitting: Family Medicine

## 2023-03-09 DIAGNOSIS — E1159 Type 2 diabetes mellitus with other circulatory complications: Secondary | ICD-10-CM

## 2023-03-15 ENCOUNTER — Ambulatory Visit: Payer: MEDICARE | Admitting: Internal Medicine

## 2023-03-17 ENCOUNTER — Other Ambulatory Visit: Payer: Self-pay

## 2023-03-17 ENCOUNTER — Other Ambulatory Visit: Payer: Self-pay | Admitting: Internal Medicine

## 2023-03-17 ENCOUNTER — Encounter: Payer: Self-pay | Admitting: Internal Medicine

## 2023-03-17 ENCOUNTER — Ambulatory Visit (INDEPENDENT_AMBULATORY_CARE_PROVIDER_SITE_OTHER): Payer: MEDICARE | Admitting: Internal Medicine

## 2023-03-17 VITALS — BP 140/85 | HR 54 | Temp 97.6°F | Ht 66.0 in | Wt 157.0 lb

## 2023-03-17 DIAGNOSIS — N400 Enlarged prostate without lower urinary tract symptoms: Secondary | ICD-10-CM | POA: Diagnosis not present

## 2023-03-17 DIAGNOSIS — B20 Human immunodeficiency virus [HIV] disease: Secondary | ICD-10-CM

## 2023-03-17 DIAGNOSIS — Z79899 Other long term (current) drug therapy: Secondary | ICD-10-CM | POA: Diagnosis not present

## 2023-03-17 MED ORDER — TRIUMEQ 600-50-300 MG PO TABS: 1.0000 | ORAL_TABLET | Freq: Every day | ORAL | 11 refills | Status: DC

## 2023-03-17 NOTE — Progress Notes (Signed)
RFV: follow up for hiv disease Patient ID: Jeremy Reeves, male   DOB: June 27, 1941, 82 y.o.   MRN: [id number]  HPI Jeremy Reeves M with well controlled hiv disease, continues to do well with taking triumeq, not missing a dose. He has been in good health. No recent hospitalizations. He recently   [employer]. Doing well. Continues tor ride Electrical engineer. Wife is here during his visit.  Outpatient Encounter Medications as of 03/17/2023  Medication Sig   abacavir-dolutegravir-lamiVUDine (TRIUMEQ) 600-50-300 MG tablet TAKE 1 TABLET BY MOUTH DAILY   apixaban (ELIQUIS) 5 MG TABS tablet TAKE 1 TABLET TWICE DAILY   carvedilol (COREG) 6.25 MG tablet TAKE 1 TABLET TWICE DAILY WITH MEALS   Cinnamon 500 MG capsule Take 1,000 mg by mouth daily.   Cranberry 360 MG CAPS Take by mouth.   mirabegron ER (MYRBETRIQ) 25 MG TB24 tablet Take 1 tablet (25 mg total) by mouth daily.   Multiple Vitamins-Minerals (MULTIVITAMIN WITH MINERALS) tablet Take 1 tablet by mouth daily.   niacin (NIASPAN) 1000 MG CR tablet TAKE 2 TABLETS AT BEDTIME   omeprazole (PRILOSEC) 20 MG capsule TAKE 1 CAPSULE EVERY DAY   pravastatin (PRAVACHOL) 40 MG tablet TAKE 1 TABLET EVERY DAY (OBTAIN FURTHER REFILLS FROM PRIMARY CARE PROVIDER)   vitamin E 400 UNIT capsule Take 400 Units by mouth daily.   amoxicillin (AMOXIL) 875 MG tablet Take 1 tablet (875 mg total) by mouth 2 (two) times daily. (Patient not taking: Reported on 08/16/2022)   No facility-administered encounter medications on file as of 03/17/2023.     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) 03/18/2011     Health Maintenance Due  Topic Date Due   Zoster Vaccines- Shingrix (1 of 2) Never done   OPHTHALMOLOGY EXAM  06/08/2016   Diabetic kidney evaluation - Urine ACR  08/11/2016   FOOT EXAM  04/09/2022   COVID-19 Vaccine (4 - 2023-24 season) 06/18/2022   HEMOGLOBIN A1C  10/14/2022   Diabetic kidney evaluation - eGFR measurement  02/23/2023   Medicare Annual Wellness (AWV)  04/15/2023     Review of Systems Loss of appetite Physical Exam   BP (!) 140/85   Pulse (!) 54   Temp 97.6 F (36.4 C) (Oral)   Ht 5\' 6"  (1.676 m)   Wt 157 lb (71.2 kg)   SpO2 100%   BMI 25.34 kg/m   Physical Exam  Constitutional: He is oriented to person, place, and time. He appears well-developed and well-nourished. No distress.  HENT:  Mouth/Throat: Oropharynx is clear and moist. No oropharyngeal exudate.  Cardiovascular: Normal rate, regular rhythm and normal heart sounds. Exam reveals no gallop and no friction rub.  No murmur heard.  Pulmonary/Chest: Effort normal and breath sounds normal. No respiratory distress. He has no wheezes.  Abdominal: Soft. Bowel sounds are normal. He exhibits no distension. There is no tenderness.  Lymphadenopathy:  He has no cervical adenopathy.  Neurological: He is alert and oriented to person, place, and time.  Skin: Skin is warm  and dry. No rash noted. No erythema.  Psychiatric: He has a normal mood and affect. His behavior is normal.   Lab Results  Component Value Date   CD4TCELL 39 02/22/2022   Lab Results  Component Value Date   CD4TABS 513 02/22/2022   CD4TABS 459 03/23/2021   CD4TABS 563 02/25/2020   Lab Results  Component Value Date   HIV1RNAQUANT 24 (H) 02/22/2022   Lab Results  Component Value Date   HEPBSAB NEG 05/24/2013   Lab Results  Component Value Date   LABRPR NON-REACTIVE  08/27/2019    CBC Lab Results  Component Value Date   WBC 4.9 02/22/2022   RBC 5.76 02/22/2022   HGB 16.9 02/22/2022   HCT 51.1 (H) 02/22/2022   PLT 82 (L) 02/22/2022   MCV 88.7 02/22/2022   MCH 29.3 02/22/2022   MCHC 33.1 02/22/2022   RDW 15.4 (H) 02/22/2022   LYMPHSABS 1,441 02/22/2022   MONOABS 495 04/05/2017   EOSABS 108 02/22/2022    BMET Lab Results  Component Value Date   NA 143 02/22/2022   K 3.8 02/22/2022   CL 105 02/22/2022   CO2 28 02/22/2022   GLUCOSE 92 02/22/2022   BUN 18 02/22/2022   CREATININE 1.49 (H) 02/22/2022   CALCIUM 9.3 02/22/2022   GFRNONAA 40 (L) 03/23/2021   GFRAA 47 (L) 03/23/2021      Assessment and Plan  HIV disease= continue on triumeq. Will give refill. Check Labs  Long term medication management = will check cr function  Hyperlipidemia = will check Lipids  Amb ref urology for incontinence  Loss of appetite= increase protein/calorie intake

## 2023-03-17 NOTE — Telephone Encounter (Signed)
Appt today 5/30

## 2023-03-18 LAB — LIPID PANEL
Cholesterol: 133 mg/dL (ref <200)
LDL Cholesterol (Calc): 59 mg/dL (calc)

## 2023-03-19 LAB — CBC WITH DIFFERENTIAL/PLATELET
Absolute Monocytes: 420 cells/uL (ref 200–950)
Basophils Absolute: 29 cells/uL (ref 0–200)
Basophils Relative: 0.7 %
Eosinophils Absolute: 109 cells/uL (ref 15–500)
Eosinophils Relative: 2.6 %
HCT: 48.2 % (ref 38.5–50.0)
Hemoglobin: 16.1 g/dL (ref 13.2–17.1)
Lymphs Abs: 1268 cells/uL (ref 850–3900)
MCH: 29.7 pg (ref 27.0–33.0)
MCHC: 33.4 g/dL (ref 32.0–36.0)
MCV: 88.8 fL (ref 80.0–100.0)
MPV: 12.5 fL (ref 7.5–12.5)
Monocytes Relative: 10 %
Neutro Abs: 2373 cells/uL (ref 1500–7800)
Neutrophils Relative %: 56.5 %
Platelets: 96 10*3/uL — ABNORMAL LOW (ref 140–400)
RBC: 5.43 10*6/uL (ref 4.20–5.80)
RDW: 14.6 % (ref 11.0–15.0)
Total Lymphocyte: 30.2 %
WBC: 4.2 10*3/uL (ref 3.8–10.8)

## 2023-03-19 LAB — LIPID PANEL
HDL: 57 mg/dL (ref >=40)
Non-HDL Cholesterol (Calc): 76 mg/dL (calc) (ref <130)
Total CHOL/HDL Ratio: 2.3 (calc) (ref <5.0)
Triglycerides: 91 mg/dL (ref <150)

## 2023-03-19 LAB — HIV-1 RNA QUANT-NO REFLEX-BLD
HIV 1 RNA Quant: NOT DETECTED Copies/mL
HIV-1 RNA Quant, Log: NOT DETECTED Log cps/mL

## 2023-03-19 LAB — COMPLETE METABOLIC PANEL WITH GFR
AG Ratio: 1.7 (calc) (ref 1.0–2.5)
ALT: 23 U/L (ref 9–46)
AST: 28 U/L (ref 10–35)
Albumin: 4.5 g/dL (ref 3.6–5.1)
Alkaline phosphatase (APISO): 58 U/L (ref 35–144)
BUN/Creatinine Ratio: 9 (calc) (ref 6–22)
BUN: 14 mg/dL (ref 7–25)
CO2: 27 mmol/L (ref 20–32)
Calcium: 9.3 mg/dL (ref 8.6–10.3)
Chloride: 107 mmol/L (ref 98–110)
Creat: 1.63 mg/dL — ABNORMAL HIGH (ref 0.70–1.22)
Globulin: 2.7 g/dL (calc) (ref 1.9–3.7)
Glucose, Bld: 60 mg/dL — ABNORMAL LOW (ref 65–99)
Potassium: 4.2 mmol/L (ref 3.5–5.3)
Sodium: 143 mmol/L (ref 135–146)
Total Bilirubin: 0.8 mg/dL (ref 0.2–1.2)
Total Protein: 7.2 g/dL (ref 6.1–8.1)
eGFR: 42 mL/min/{1.73_m2} — ABNORMAL LOW (ref >=60)

## 2023-03-19 LAB — RPR: RPR Ser Ql: NONREACTIVE

## 2023-03-20 ENCOUNTER — Other Ambulatory Visit: Payer: Self-pay | Admitting: Family Medicine

## 2023-03-20 DIAGNOSIS — E1169 Type 2 diabetes mellitus with other specified complication: Secondary | ICD-10-CM

## 2023-03-21 LAB — T-HELPER CELL (CD4) - (RCID CLINIC ONLY)
CD4 % Helper T Cell: 39 % (ref 33–65)
CD4 T Cell Abs: 437 /uL (ref 400–1790)

## 2023-04-22 ENCOUNTER — Ambulatory Visit: Payer: MEDICARE | Admitting: Family Medicine

## 2023-04-26 ENCOUNTER — Ambulatory Visit: Payer: MEDICARE | Admitting: Family Medicine

## 2023-04-27 ENCOUNTER — Other Ambulatory Visit: Payer: Self-pay | Admitting: Cardiovascular Disease

## 2023-05-02 DIAGNOSIS — N3941 Urge incontinence: Secondary | ICD-10-CM | POA: Diagnosis not present

## 2023-05-02 DIAGNOSIS — N3281 Overactive bladder: Secondary | ICD-10-CM | POA: Diagnosis not present

## 2023-05-07 ENCOUNTER — Other Ambulatory Visit: Payer: Self-pay | Admitting: Cardiovascular Disease

## 2023-05-07 DIAGNOSIS — I4819 Other persistent atrial fibrillation: Secondary | ICD-10-CM

## 2023-05-09 ENCOUNTER — Other Ambulatory Visit: Payer: Self-pay

## 2023-05-09 ENCOUNTER — Telehealth: Payer: Self-pay

## 2023-05-09 DIAGNOSIS — I4819 Other persistent atrial fibrillation: Secondary | ICD-10-CM

## 2023-05-09 MED ORDER — APIXABAN 2.5 MG PO TABS
2.5000 mg | ORAL_TABLET | Freq: Two times a day (BID) | ORAL | 1 refills | Status: DC
Start: 2023-05-09 — End: 2023-10-03

## 2023-05-09 NOTE — Telephone Encounter (Signed)
Prescription refill request for Eliquis received. Indication:afib Last office visit:9/23 Scr:1.63  5/24 Age: 82 Weight:71.2  kg  Under review for dose reduction

## 2023-05-09 NOTE — Telephone Encounter (Signed)
Lpmtcb to discuss dose reduction for Eliquis from 5 mg to 2.5 mg bid.

## 2023-05-23 DIAGNOSIS — N3281 Overactive bladder: Secondary | ICD-10-CM | POA: Diagnosis not present

## 2023-05-23 DIAGNOSIS — N3941 Urge incontinence: Secondary | ICD-10-CM | POA: Diagnosis not present

## 2023-05-31 ENCOUNTER — Ambulatory Visit (INDEPENDENT_AMBULATORY_CARE_PROVIDER_SITE_OTHER): Payer: MEDICARE

## 2023-05-31 VITALS — BP 136/95 | HR 48 | Ht 67.0 in | Wt 150.0 lb

## 2023-05-31 DIAGNOSIS — Z0000 Encounter for general adult medical examination without abnormal findings: Secondary | ICD-10-CM

## 2023-05-31 NOTE — Patient Instructions (Signed)
Jeremy Reeves , Thank you for taking time to come for your Medicare Wellness Visit. I appreciate your ongoing commitment to your health goals. Please review the following plan we discussed and let me know if I can assist you in the future.   Referrals/Orders/Follow-Ups/Clinician Recommendations: none  This is a list of the screening recommended for you and due dates:  Health Maintenance  Topic Date Due   Zoster (Shingles) Vaccine (1 of 2) 08[birth year]17/1961   Eye exam for diabetics  06/08/2016   Yearly kidney health urinalysis for diabetes  08/11/2016   Complete foot exam   04/09/2022   COVID-19 Vaccine (4 - 2023-24 season) 06/18/2022   Hemoglobin A1C  10/14/2022   Flu Shot  05/19/2023   Yearly kidney function blood test for diabetes  03/16/2024   Medicare Annual Wellness Visit  05/30/2024   DTaP/Tdap/Td vaccine (4 - Td or Tdap) 07/10/2028   Pneumonia Vaccine  Completed   HPV Vaccine  Aged Out   Hepatitis C Screening  Discontinued    Advanced directives: (In Chart) A copy of your advanced directives are scanned into your chart should your provider ever need it.  Next Medicare Annual Wellness Visit scheduled for next year: Yes  Preventive Care 49 Years and Older, Male  Preventive care refers to lifestyle choices and visits with your health care provider that can promote health and wellness. What does preventive care include? A yearly physical exam. This is also called an annual well check. Dental exams once or twice a year. Routine eye exams. Ask your health care provider how often you should have your eyes checked. Personal lifestyle choices, including: Daily care of your teeth and gums. Regular physical activity. Eating a healthy diet. Avoiding tobacco and drug use. Limiting alcohol use. Practicing safe sex. Taking low doses of aspirin every day. Taking vitamin and mineral supplements as recommended by your health care provider. What happens during an annual well check? The  services and screenings done by your health care provider during your annual well check will depend on your age, overall health, lifestyle risk factors, and family history of disease. Counseling  Your health care provider may ask you questions about your: Alcohol use. Tobacco use. Drug use. Emotional well-being. Home and relationship well-being. Sexual activity. Eating habits. History of falls. Memory and ability to understand (cognition). Work and work Astronomer. Screening  You may have the following tests or measurements: Height, weight, and BMI. Blood pressure. Lipid and cholesterol levels. These may be checked every 5 years, or more frequently if you are over 81 years old. Skin check. Lung cancer screening. You may have this screening every year starting at age 65 if you have a 30-pack-year history of smoking and currently smoke or have quit within the past 15 years. Fecal occult blood test (FOBT) of the stool. You may have this test every year starting at age 88. Flexible sigmoidoscopy or colonoscopy. You may have a sigmoidoscopy every 5 years or a colonoscopy every 10 years starting at age 40. Prostate cancer screening. Recommendations will vary depending on your family history and other risks. Hepatitis C blood test. Hepatitis B blood test. Sexually transmitted disease (STD) testing. Diabetes screening. This is done by checking your blood sugar (glucose) after you have not eaten for a while (fasting). You may have this done every 1-3 years. Abdominal aortic aneurysm (AAA) screening. You may need this if you are a current or former smoker. Osteoporosis. You may be screened starting at age 12 if you  are at high risk. Talk with your health care provider about your test results, treatment options, and if necessary, the need for more tests. Vaccines  Your health care provider may recommend certain vaccines, such as: Influenza vaccine. This is recommended every year. Tetanus,  diphtheria, and acellular pertussis (Tdap, Td) vaccine. You may need a Td booster every 10 years. Zoster vaccine. You may need this after age 84. Pneumococcal 13-valent conjugate (PCV13) vaccine. One dose is recommended after age 71. Pneumococcal polysaccharide (PPSV23) vaccine. One dose is recommended after age 43. Talk to your health care provider about which screenings and vaccines you need and how often you need them. This information is not intended to replace advice given to you by your health care provider. Make sure you discuss any questions you have with your health care provider. Document Released: 10/31/2015 Document Revised: 06/23/2016 Document Reviewed: 08/05/2015 Elsevier Interactive Patient Education  2017 [employer].  Fall Prevention in the Home Falls can cause injuries. They can happen to people of all ages. There are many things you can do to make your home safe and to help prevent falls. What can I do on the outside of my home? Regularly fix the edges of walkways and driveways and fix any cracks. Remove anything that might make you trip as you walk through a door, such as a raised step or threshold. Trim any bushes or trees on the path to your home. Use bright outdoor lighting. Clear any walking paths of anything that might make someone trip, such as rocks or tools. Regularly check to see if handrails are loose or broken. Make sure that both sides of any steps have handrails. Any raised decks and porches should have guardrails on the edges. Have any leaves, snow, or ice cleared regularly. Use sand or salt on walking paths during winter. Clean up any spills in your garage right away. This includes oil or grease spills. What can I do in the bathroom? Use night lights. Install grab bars by the toilet and in the tub and shower. Do not use towel bars as grab bars. Use non-skid mats or decals in the tub or shower. If you need to sit down in the shower, use a plastic,  non-slip stool. Keep the floor dry. Clean up any water that spills on the floor as soon as it happens. Remove soap buildup in the tub or shower regularly. Attach bath mats securely with double-sided non-slip rug tape. Do not have throw rugs and other things on the floor that can make you trip. What can I do in the bedroom? Use night lights. Make sure that you have a light by your bed that is easy to reach. Do not use any sheets or blankets that are too big for your bed. They should not hang down onto the floor. Have a firm chair that has side arms. You can use this for support while you get dressed. Do not have throw rugs and other things on the floor that can make you trip. What can I do in the kitchen? Clean up any spills right away. Avoid walking on wet floors. Keep items that you use a lot in easy-to-reach places. If you need to reach something above you, use a strong step stool that has a grab bar. Keep electrical cords out of the way. Do not use floor polish or wax that makes floors slippery. If you must use wax, use non-skid floor wax. Do not have throw rugs and other things on the  floor that can make you trip. What can I do with my stairs? Do not leave any items on the stairs. Make sure that there are handrails on both sides of the stairs and use them. Fix handrails that are broken or loose. Make sure that handrails are as long as the stairways. Check any carpeting to make sure that it is firmly attached to the stairs. Fix any carpet that is loose or worn. Avoid having throw rugs at the top or bottom of the stairs. If you do have throw rugs, attach them to the floor with carpet tape. Make sure that you have a light switch at the top of the stairs and the bottom of the stairs. If you do not have them, ask someone to add them for you. What else can I do to help prevent falls? Wear shoes that: Do not have high heels. Have rubber bottoms. Are comfortable and fit you well. Are closed  at the toe. Do not wear sandals. If you use a stepladder: Make sure that it is fully opened. Do not climb a closed stepladder. Make sure that both sides of the stepladder are locked into place. Ask someone to hold it for you, if possible. Clearly mark and make sure that you can see: Any grab bars or handrails. First and last steps. Where the edge of each step is. Use tools that help you move around (mobility aids) if they are needed. These include: Canes. Walkers. Scooters. Crutches. Turn on the lights when you go into a dark area. Replace any light bulbs as soon as they burn out. Set up your furniture so you have a clear path. Avoid moving your furniture around. If any of your floors are uneven, fix them. If there are any pets around you, be aware of where they are. Review your medicines with your doctor. Some medicines can make you feel dizzy. This can increase your chance of falling. Ask your doctor what other things that you can do to help prevent falls. This information is not intended to replace advice given to you by your health care provider. Make sure you discuss any questions you have with your health care provider. Document Released: 07/31/2009 Document Revised: 03/11/2016 Document Reviewed: 11/08/2014 Elsevier Interactive Patient Education  2017 [employer].

## 2023-05-31 NOTE — Progress Notes (Signed)
Subjective:   Jeremy Reeves is a 82 y.o. male who presents for Medicare Annual/Subsequent preventive examination.  Visit Complete: Virtual  I connected with  Maricela Bo on 05/31/23 by a audio enabled telemedicine application and verified that I am speaking with the correct person using two identifiers. Spouse was also on the call.  Patient Location: Home  Provider Location: Office/Clinic  I discussed the limitations of evaluation and management by telemedicine. The patient expressed understanding and agreed to proceed.  Vital Signs: Vital signs are patient reported.  Review of Systems     Cardiac Risk Factors include: advanced age (>1men, >49 women);diabetes mellitus;dyslipidemia;hypertension;male gender     Objective:    Today's Vitals   05/31/23 1026  BP: (!) 136/95  Pulse: (!) 48  Weight: 150 lb (68 kg)  Height: 5\' 7"  (1.702 m)   Body mass index is 23.49 kg/m.     05/31/2023   10:37 AM 04/14/2022    9:42 AM 04/09/2021    2:30 PM 03/05/2020   11:43 AM 11/28/2017   10:14 AM 06/24/2015   11:04 AM 10/25/2013    8:19 AM  Advanced Directives  Does Patient Have a Medical Advance Directive? Yes Yes Yes Yes Yes No   Type of Estate agent of [place];Living will Living will;Healthcare Power of Attorney Living will;Healthcare Power of [employer] Power of [place];Living will Healthcare Power of [place];Living will    Does patient want to make changes to medical advance directive?  No - Patient declined No - Patient declined No - Patient declined     Copy of Healthcare Power of Attorney in Chart? Yes - validated most recent copy scanned in chart (See row information) Yes - validated most recent copy scanned in chart (See row information) No - copy requested No - copy requested No - copy requested    Would patient like information on creating a medical advance directive?      No - patient declined information   Pre-existing out of facility DNR  order (yellow form or pink MOST form)       No    Current Medications (verified) Outpatient Encounter Medications as of 05/31/2023  Medication Sig   abacavir-dolutegravir-lamiVUDine (TRIUMEQ) 600-50-300 MG tablet Take 1 tablet by mouth daily.   apixaban (ELIQUIS) 2.5 MG TABS tablet Take 1 tablet (2.5 mg total) by mouth 2 (two) times daily.   carvedilol (COREG) 6.25 MG tablet TAKE 1 TABLET TWICE DAILY WITH MEALS   Cinnamon 500 MG capsule Take 1,000 mg by mouth daily.   Cranberry 360 MG CAPS Take by mouth.   mirabegron ER (MYRBETRIQ) 25 MG TB24 tablet Take 1 tablet (25 mg total) by mouth daily.   Multiple Vitamins-Minerals (MULTIVITAMIN WITH MINERALS) tablet Take 1 tablet by mouth daily.   niacin (NIASPAN) 1000 MG CR tablet TAKE 2 TABLETS AT BEDTIME   omeprazole (PRILOSEC) 20 MG capsule TAKE 1 CAPSULE EVERY DAY   pravastatin (PRAVACHOL) 40 MG tablet TAKE 1 TABLET EVERY DAY (OBTAIN FURTHER REFILLS FROM PRIMARY CARE PROVIDER)   vitamin E 400 UNIT capsule Take 400 Units by mouth daily.   No facility-administered encounter medications on file as of 05/31/2023.    Allergies (verified) Peanut-containing drug products   History: Past Medical History:  Diagnosis Date   ASHD (arteriosclerotic heart disease)    BPH (benign prostatic hyperplasia)    Difficult intubation    anesthesia record 1999 / record on chart   Foley catheter in place 10/23/12  GERD (gastroesophageal reflux disease)    HH (hiatus hernia)    Hiatal hernia    HIV (human immunodeficiency virus infection) (HCC) dx'd 2014   Hyperlipidemia    Hypertension    Shingles 8/14   Type II diabetes mellitus (HCC)    pre diabetes   Past Surgical History:  Procedure Laterality Date   CARDIAC CATHETERIZATION  05/02/1998   Recommend CABG   CARDIOVASCULAR STRESS TEST  12/01/2011   Perfusion defect consistent with diaphragmatic attenuation. There is no scintigraphic of inducible myocardial ischemia.    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 LIMA to the LAD, vein to obtuse marginal, and vein to RCA   IRRIGATION AND DEBRIDEMENT ABSCESS Right 12/04/2015   Procedure: MINOR INCISION AND DRAINAGE OF ABSCESS;  Surgeon: Betha Loa, MD;  Location: [facility];  Service: Orthopedics;  Laterality: Right;   TRANSTHORACIC ECHOCARDIOGRAM  03/29/2011   EF ~50%, mild-moderate inferior wall hypokinesis   TRANSURETHRAL RESECTION OF PROSTATE N/A 10/25/2013   Procedure: TRANSURETHRAL RESECTION OF THE PROSTATE WITH GYRUS BUTTON;  Surgeon: Bjorn Pippin, MD;  Location: [facility];  Service: Urology;  Laterality: N/A;   Family History  Problem Relation Age of Onset   Kidney disease Mother    Heart disease Father    Hypertension Brother    Social History   Socioeconomic History   Marital status: Married    Spouse name: Not on file   Number of children: Not on file   Years of education: Not on file   Highest education level: Not on file  Occupational History   Not on file  Tobacco Use   Smoking status: Former    Current packs/day: 0.12    Average packs/day: 0.1 packs/day for 40.0 years (4.8 ttl pk-yrs)    Types: Cigarettes   Smokeless tobacco: Never   Tobacco comments:    07/04/2013 "I quit smoking before 2000"  Vaping Use   Vaping status: Never Used  Substance and Sexual Activity   Alcohol use: No   Drug use: No   Sexual activity: Not Currently    Partners: Female    Comment: declined condoms  Other Topics Concern   Not on file  Social History Narrative   Not on file   Social Determinants of Health   Financial Resource Strain: Low Risk  (05/31/2023)   Overall Financial Resource Strain (CARDIA)    Difficulty of Paying Living Expenses: Not hard at all  Food Insecurity: No Food Insecurity (05/31/2023)   Hunger Vital Sign    Worried About Running Out of Food in the Last Year: Never true    Ran Out of Food in the Last Year: Never true  Transportation Needs: No Transportation Needs  (05/31/2023)   PRAPARE - Administrator, Civil Service (Medical): No    Lack of Transportation (Non-Medical): No  Physical Activity: Inactive (05/31/2023)   Exercise Vital Sign    Days of Exercise per Week: 0 days    Minutes of Exercise per Session: 0 min  Stress: No Stress Concern Present (05/31/2023)   [employer] of Occupational Health - Occupational Stress Questionnaire    Feeling of Stress : Not at all  Social Connections: Socially Integrated (05/31/2023)   Social Connection and Isolation Panel [NHANES]    Frequency of Communication with Friends and Family: Three times a week    Frequency of Social Gatherings with Friends and Family: More than three times a week  Attends Religious Services: More than 4 times per year    Active Member of Clubs or Organizations: Yes    Attends Banker Meetings: More than 4 times per year    Marital Status: Married    Tobacco Counseling Counseling given: Not Answered Tobacco comments: 07/04/2013 "I quit smoking before 2000"   Clinical Intake:  Pre-visit preparation completed: Yes  Pain : No/denies pain     Nutritional Status: BMI of 19-24  Normal Nutritional Risks: None Diabetes: Yes CBG done?: No Did pt. bring in CBG monitor from home?: No  How often do you need to have someone help you when you read instructions, pamphlets, or other written materials from your doctor or pharmacy?: 1 - Never  Interpreter Needed?: No  Information entered by :: NAllen LPN   Activities of Daily Living    05/31/2023   10:30 AM  In your present state of health, do you have any difficulty performing the following activities:  Hearing? 0  Vision? 0  Difficulty concentrating or making decisions? 1  Walking or climbing stairs? 0  Dressing or bathing? 0  Doing errands, shopping? 0  Preparing Food and eating ? N  Using the Toilet? N  In the past six months, have you accidently leaked urine? N  Do you have problems with  loss of bowel control? N  Managing your Medications? Y  Comment wife sets up  Managing your Finances? N  Housekeeping or managing your Housekeeping? N    Patient Care Team: Ronnald Nian, MD as PCP - General (Family Medicine) Runell Gess, MD as PCP - Cardiology (Cardiology) Judyann Munson, MD as Consulting Physician (Infectious Diseases)  Indicate any recent Medical Services you may have received from other than [facility] providers in the past year (date may be approximate).     Assessment:   This is a routine wellness examination for Tahir.  Hearing/Vision screen Hearing Screening - Comments:: Denies hearing issues Vision Screening - Comments:: No regular eye exams, [facility]  Dietary issues and exercise activities discussed:     Goals Addressed             This Visit's Progress    Patient Stated       05/31/2023, wants the urine to get better       Depression Screen    05/31/2023   10:41 AM 03/17/2023    3:34 PM 04/14/2022    9:43 AM 02/22/2022   10:58 AM 04/09/2021    2:31 PM 03/23/2021   10:57 AM 03/05/2020   11:11 AM  PHQ 2/9 Scores  PHQ - 2 Score 0 0 0 0 0 0 0  PHQ- 9 Score 0          Fall Risk    05/31/2023   10:40 AM 03/17/2023    3:34 PM 04/14/2022    9:43 AM 02/22/2022   10:58 AM 04/09/2021    2:31 PM  Fall Risk   Falls in the past year? 0 0 0 0 0  Number falls in past yr: 0  0 0 0  Injury with Fall? 0  0 0 0  Risk for fall due to : Medication side effect No Fall Risks No Fall Risks No Fall Risks No Fall Risks  Follow up Falls prevention discussed;Falls evaluation completed Falls evaluation completed Falls evaluation completed Falls evaluation completed Falls evaluation completed    MEDICARE RISK AT HOME:  Medicare Risk at Home - 05/31/23 1040  Any stairs in or around the home? No    If so, are there any without handrails? No    Home free of loose throw rugs in walkways, pet beds, electrical cords, etc? Yes    Adequate lighting in  your home to reduce risk of falls? Yes    Life alert? No    Use of a cane, walker or w/c? No    Grab bars in the bathroom? Yes    Shower chair or bench in shower? No             TIMED UP AND GO:  Was the test performed?  No    Cognitive Function:        05/31/2023   10:42 AM  6CIT Screen  What Year? 0 points  What month? 0 points  What time? 0 points  Count back from 20 0 points  Months in reverse 4 points  Repeat phrase 6 points  Total Score 10 points    Immunizations Immunization History  Administered Date(s) Administered   Fluad Quad(high Dose 65+) 10/16/2020   Hepatitis B, ADULT 12/10/2013, 01/07/2014, 07/31/2014   Influenza Split 09/08/2011, 08/15/2012   Influenza, High Dose Seasonal PF 08/12/2015, 08/18/2016, 08/01/2017, 07/18/2018   Influenza,inj,Quad PF,6+ Mos 06/08/2013, 07/31/2014, 07/30/2019   Moderna Sars-Covid-2 Vaccination 11/13/2019, 12/11/2019, 08/31/2020   PNEUMOCOCCAL CONJUGATE-20 02/22/2022   Pneumococcal Conjugate-13 08/12/2015   Pneumococcal Polysaccharide-23 01/12/2008, 08/21/2013   Td 12/04/2015   Tdap 01/12/2008, 07/10/2018   Zoster, Live 12/19/2009    TDAP status: Up to date  Flu Vaccine status: Due, Education has been provided regarding the importance of this vaccine. Advised may receive this vaccine at local pharmacy or Health Dept. Aware to provide a copy of the vaccination record if obtained from local pharmacy or Health Dept. Verbalized acceptance and understanding.  Pneumococcal vaccine status: Up to date  Covid-19 vaccine status: Information provided on how to obtain vaccines.   Qualifies for Shingles Vaccine? Yes   Zostavax completed Yes   Shingrix Completed?: No.    Education has been provided regarding the importance of this vaccine. Patient has been advised to call insurance company to determine out of pocket expense if they have not yet received this vaccine. Advised may also receive vaccine at local pharmacy or Health  Dept. Verbalized acceptance and understanding.  Screening Tests Health Maintenance  Topic Date Due   Zoster Vaccines- Shingrix (1 of 2) 08/17/1960   OPHTHALMOLOGY EXAM  06/08/2016   Diabetic kidney evaluation - Urine ACR  08/11/2016   FOOT EXAM  04/09/2022   COVID-19 Vaccine (4 - 2023-24 season) 06/18/2022   HEMOGLOBIN A1C  10/14/2022   INFLUENZA VACCINE  05/19/2023   Diabetic kidney evaluation - eGFR measurement  03/16/2024   Medicare Annual Wellness (AWV)  05/30/2024   DTaP/Tdap/Td (4 - Td or Tdap) 07/10/2028   Pneumonia Vaccine 41+ Years old  Completed   HPV VACCINES  Aged Out   Hepatitis C Screening  Discontinued    Health Maintenance  Health Maintenance Due  Topic Date Due   Zoster Vaccines- Shingrix (1 of 2) 08/17/1960   OPHTHALMOLOGY EXAM  06/08/2016   Diabetic kidney evaluation - Urine ACR  08/11/2016   FOOT EXAM  04/09/2022   COVID-19 Vaccine (4 - 2023-24 season) 06/18/2022   HEMOGLOBIN A1C  10/14/2022   INFLUENZA VACCINE  05/19/2023    Colorectal cancer screening: No longer required.   Lung Cancer Screening: (Low Dose CT Chest recommended if Age 49-80 years, 20 pack-year currently smoking  OR have quit w/in 15years.) does not qualify.   Lung Cancer Screening Referral: no  Additional Screening:  Hepatitis C Screening: does not qualify;   Vision Screening: Recommended annual ophthalmology exams for early detection of glaucoma and other disorders of the eye. Is the patient up to date with their annual eye exam?  No  Who is the provider or what is the name of the office in which the patient attends annual eye exams? [facility] If pt is not established with a provider, would they like to be referred to a provider to establish care? No .   Dental Screening: Recommended annual dental exams for proper oral hygiene  Diabetic Foot Exam: Diabetic Foot Exam: Overdue, Pt has been advised about the importance in completing this exam. Pt is scheduled for  diabetic foot exam on next appointment.  Community Resource Referral / Chronic Care Management: CRR required this visit?  No   CCM required this visit?  No     Plan:     I have personally reviewed and noted the following in the patient's chart:   Medical and social history Use of alcohol, tobacco or illicit drugs  Current medications and supplements including opioid prescriptions. Patient is not currently taking opioid prescriptions. Functional ability and status Nutritional status Physical activity Advanced directives List of other physicians Hospitalizations, surgeries, and ER visits in previous 12 months Vitals Screenings to include cognitive, depression, and falls Referrals and appointments  In addition, I have reviewed and discussed with patient certain preventive protocols, quality metrics, and best practice recommendations. A written personalized care plan for preventive services as well as general preventive health recommendations were provided to patient.     Barb Merino, LPN   [id number]/60/[id number]   After Visit Summary: (Pick Up) Due to this being a telephonic visit, with patients personalized plan was offered to patient and patient has requested to Pick up at office.  Nurse Notes: none

## 2023-06-03 ENCOUNTER — Other Ambulatory Visit: Payer: Self-pay | Admitting: Family Medicine

## 2023-06-03 DIAGNOSIS — E1169 Type 2 diabetes mellitus with other specified complication: Secondary | ICD-10-CM

## 2023-06-12 DIAGNOSIS — N3281 Overactive bladder: Secondary | ICD-10-CM | POA: Insufficient documentation

## 2023-06-13 ENCOUNTER — Ambulatory Visit (INDEPENDENT_AMBULATORY_CARE_PROVIDER_SITE_OTHER): Payer: MEDICARE | Admitting: Family Medicine

## 2023-06-13 ENCOUNTER — Encounter: Payer: Self-pay | Admitting: Family Medicine

## 2023-06-13 VITALS — BP 126/82 | HR 68 | Ht 65.75 in | Wt 157.0 lb

## 2023-06-13 DIAGNOSIS — N400 Enlarged prostate without lower urinary tract symptoms: Secondary | ICD-10-CM | POA: Diagnosis not present

## 2023-06-13 DIAGNOSIS — B20 Human immunodeficiency virus [HIV] disease: Secondary | ICD-10-CM | POA: Diagnosis not present

## 2023-06-13 DIAGNOSIS — I152 Hypertension secondary to endocrine disorders: Secondary | ICD-10-CM

## 2023-06-13 DIAGNOSIS — I2510 Atherosclerotic heart disease of native coronary artery without angina pectoris: Secondary | ICD-10-CM

## 2023-06-13 DIAGNOSIS — E11A Type 2 diabetes mellitus without complications in remission: Secondary | ICD-10-CM

## 2023-06-13 DIAGNOSIS — E1159 Type 2 diabetes mellitus with other circulatory complications: Secondary | ICD-10-CM

## 2023-06-13 DIAGNOSIS — N3281 Overactive bladder: Secondary | ICD-10-CM

## 2023-06-13 DIAGNOSIS — E119 Type 2 diabetes mellitus without complications: Secondary | ICD-10-CM | POA: Diagnosis not present

## 2023-06-13 DIAGNOSIS — I4819 Other persistent atrial fibrillation: Secondary | ICD-10-CM

## 2023-06-13 DIAGNOSIS — N3941 Urge incontinence: Secondary | ICD-10-CM

## 2023-06-13 DIAGNOSIS — L989 Disorder of the skin and subcutaneous tissue, unspecified: Secondary | ICD-10-CM

## 2023-06-13 DIAGNOSIS — Z0000 Encounter for general adult medical examination without abnormal findings: Secondary | ICD-10-CM

## 2023-06-13 DIAGNOSIS — K2101 Gastro-esophageal reflux disease with esophagitis, with bleeding: Secondary | ICD-10-CM | POA: Diagnosis not present

## 2023-06-13 DIAGNOSIS — N1831 Chronic kidney disease, stage 3a: Secondary | ICD-10-CM

## 2023-06-13 DIAGNOSIS — E1169 Type 2 diabetes mellitus with other specified complication: Secondary | ICD-10-CM

## 2023-06-13 DIAGNOSIS — Z23 Encounter for immunization: Secondary | ICD-10-CM

## 2023-06-13 LAB — POCT GLYCOSYLATED HEMOGLOBIN (HGB A1C): Hemoglobin A1C: 6 % — AB (ref 4.0–5.6)

## 2023-06-13 MED ORDER — PRAVASTATIN SODIUM 40 MG PO TABS: ORAL_TABLET | ORAL | 0 refills | Status: DC

## 2023-06-13 MED ORDER — CARVEDILOL 6.25 MG PO TABS
6.2500 mg | ORAL_TABLET | Freq: Two times a day (BID) | ORAL | 1 refills | Status: DC
Start: 2023-06-13 — End: 2023-12-21

## 2023-06-13 NOTE — Progress Notes (Signed)
Thank you for various Jeremy Reeves is a 82 y.o. male who presents for annual wellness visit and follow-up on chronic medical conditions.  He has no particular concerns or complaints.  He continues to be followed by infectious disease for his underlying HIV.  He is also taking Myrbetriq for his OAB and this is working well.  Continues to be followed by cardiology and is on Eliquis.  He continues on Eliquis, Pravachol, Prilosec, Coreg.  Having no difficulty with any of these.  No chest pain, shortness of breath, fever, chills.  Immunizations and Health Maintenance Immunization History  Administered Date(s) Administered   Fluad Quad(high Dose 65+) 10/16/2020   Fluad Trivalent(High Dose 65+) 06/13/2023   Hepatitis B, ADULT 12/10/2013, 01/07/2014, 07/31/2014   Influenza Split 09/08/2011, 08/15/2012   Influenza, High Dose Seasonal PF 08/12/2015, 08/18/2016, 08/01/2017, 07/18/2018   Influenza,inj,Quad PF,6+ Mos 06/08/2013, 07/31/2014, 07/30/2019   Moderna Sars-Covid-2 Vaccination 11/13/2019, 12/11/2019, 08/31/2020   PNEUMOCOCCAL CONJUGATE-20 02/22/2022   Pneumococcal Conjugate-13 08/12/2015   Pneumococcal Polysaccharide-23 01/12/2008, 08/21/2013   Td 12/04/2015   Tdap 01/12/2008, 07/10/2018   Zoster, Live 12/19/2009   Health Maintenance Due  Topic Date Due   Zoster Vaccines- Shingrix (1 of 2) 08/17/1961   OPHTHALMOLOGY EXAM  06/08/2016   Diabetic kidney evaluation - Urine ACR  08/11/2016   FOOT EXAM  04/09/2022   COVID-19 Vaccine (4 - 2023-24 season) 06/18/2022     Other doctors caring for patient include: Ilsa Iha.  Maryelizabeth Rowan.  Advanced Directives:?    Depression screen:  See questionnaire below.        06/13/2023   11:13 AM 05/31/2023   10:41 AM 03/17/2023    3:34 PM 04/14/2022    9:43 AM 02/22/2022   10:58 AM  Depression screen PHQ 2/9  Decreased Interest 0 0 0 0 0  Down, Depressed, Hopeless 0 0 0 0 0  PHQ - 2 Score 0 0 0 0 0  Altered sleeping 0 0     Tired, decreased  energy 0 0     Change in appetite 0 0     Feeling bad or failure about yourself  0 0     Trouble concentrating 0 0     Moving slowly or fidgety/restless 0 0     Suicidal thoughts 0 0     PHQ-9 Score 0 0     Difficult doing work/chores Not difficult at all Not difficult at all       Fall Screen: See Questionaire below.      06/13/2023   11:11 AM 05/31/2023   10:40 AM 03/17/2023    3:34 PM 04/14/2022    9:43 AM 02/22/2022   10:58 AM  Fall Risk   Falls in the past year? 0 0 0 0 0  Number falls in past yr: 0 0  0 0  Injury with Fall? 0 0  0 0  Risk for fall due to :  Medication side effect No Fall Risks No Fall Risks No Fall Risks  Follow up Falls evaluation completed Falls prevention discussed;Falls evaluation completed Falls evaluation completed Falls evaluation completed Falls evaluation completed    ADL screen:  See questionnaire below.  Functional Status Survey: Is the patient deaf or have difficulty hearing?: No Does the patient have difficulty seeing, even when wearing glasses/contacts?: No Does the patient have difficulty concentrating, remembering, or making decisions?: No Does the patient have difficulty walking or climbing stairs?: No Does the patient have difficulty dressing or bathing?: No  Does the patient have difficulty doing errands alone such as visiting a doctor's office or shopping?: No   Review of Systems  Constitutional: -, -unexpected weight change, -anorexia, -fatigue Allergy: -sneezing, -itching, -congestion Dermatology: denies changing moles, rash, lumps ENT: -runny nose, -ear pain, -sore throat,  Cardiology:  -chest pain, -palpitations, -orthopnea, Respiratory: -cough, -shortness of breath, -dyspnea on exertion, -wheezing,  Gastroenterology: -abdominal pain, -nausea, -vomiting, -diarrhea, -constipation, -dysphagia Hematology: -bleeding or bruising problems Musculoskeletal: -arthralgias, -myalgias, -joint swelling, -back pain, - Ophthalmology: -vision  changes,  Urology: -dysuria, -difficulty urinating,  -urinary frequency, -urgency, incontinence Neurology: -, -numbness, , -memory loss, -falls, -dizziness    PHYSICAL EXAM:  BP 126/82   Pulse 68   Ht 5' 5.75" (1.67 m)   Wt 157 lb (71.2 kg)   SpO2 97%   BMI 25.53 kg/m   General Appearance: Alert, cooperative, no distress, appears stated age Head: Normocephalic, without obvious abnormality, atraumatic Eyes: PERRL, conjunctiva/corneas clear, EOM's intact,  What ears: Normal TM's and external ear canals Nose: Nares normal, mucosa normal, no drainage or sinus   tenderness Throat: Lips, mucosa, and tongue normal; teeth and gums normal Neck: Supple, a 1 cm nodule that does move when he swallows present in the right submandibular area  thyroid:no enlargement/tenderness/nodules; no carotid bruit or JVD Lungs: Clear to auscultation bilaterally without wheezes, rales or ronchi; respirations unlabored Heart: Regular rate and rhythm, S1 and S2 normal, no murmur, rub or gallop Abdomen: Soft, non-tender, nondistended, normoactive bowel sounds, no masses, no hepatosplenomegaly Extremities: No clubbing, cyanosis or edema Pulses: 2+ and symmetric all extremities Skin: Skin color, texture, turgor normal, no rashes or lesions Lymph nodes: Cervical, supraclavicular, and axillary nodes normal Neurologic: CNII-XII intact, normal strength, sensation and gait; reflexes 2+ and symmetric throughout   Psych: Normal mood, affect, hygiene and grooming Hemoglobin A1c 6.0 ASSESSMENT/PLAN: Routine general medical examination at a health care facility  ASHD (arteriosclerotic heart disease)  Benign prostatic hyperplasia without lower urinary tract symptoms  Gastroesophageal reflux disease with esophagitis and hemorrhage  HIV disease (HCC)  Hyperlipidemia associated with type 2 diabetes mellitus (HCC) - Plan: pravastatin (PRAVACHOL) 40 MG tablet  Hypertension associated with diabetes (HCC) - Plan:  carvedilol (COREG) 6.25 MG tablet  Persistent atrial fibrillation (HCC)  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    Medicare Attestation I have personally reviewed: The patient's medical and social history Their use of alcohol, tobacco or illicit drugs Their current medications and supplements The patient's functional ability including ADLs,fall risks, home safety risks, cognitive, and hearing and visual impairment Diet and physical activities Evidence for depression or mood disorders  The patient's weight, height, and BMI have been recorded in the chart.  I have made referrals, counseling, and provided education to the patient based on review of the above and I have provided the patient with a written personalized care plan for preventive services.     Sharlot Gowda, MD   06/13/2023

## 2023-06-28 ENCOUNTER — Ambulatory Visit: Payer: MEDICARE | Attending: Cardiovascular Disease | Admitting: Cardiovascular Disease

## 2023-06-28 ENCOUNTER — Encounter: Payer: Self-pay | Admitting: Cardiovascular Disease

## 2023-06-28 VITALS — BP 132/80 | HR 57 | Ht 67.0 in | Wt 156.0 lb

## 2023-06-28 DIAGNOSIS — E1169 Type 2 diabetes mellitus with other specified complication: Secondary | ICD-10-CM

## 2023-06-28 DIAGNOSIS — E1159 Type 2 diabetes mellitus with other circulatory complications: Secondary | ICD-10-CM | POA: Diagnosis not present

## 2023-06-28 DIAGNOSIS — I4819 Other persistent atrial fibrillation: Secondary | ICD-10-CM

## 2023-06-28 DIAGNOSIS — E785 Hyperlipidemia, unspecified: Secondary | ICD-10-CM | POA: Diagnosis not present

## 2023-06-28 DIAGNOSIS — I152 Hypertension secondary to endocrine disorders: Secondary | ICD-10-CM | POA: Diagnosis not present

## 2023-06-28 DIAGNOSIS — I2510 Atherosclerotic heart disease of native coronary artery without angina pectoris: Secondary | ICD-10-CM | POA: Diagnosis present

## 2023-06-28 NOTE — Progress Notes (Unsigned)
06/28/2023 Jeremy Reeves   11/10/1940  [id number]  Primary Physician Ronnald Nian, MD Primary Cardiologist: Runell Gess MD FACP, [place], [place], [place]  HPI:  Jeremy Reeves is a 82 y.o.  moderately overweight, married [place] American male, father of 2, grandfather to 3 grandchildren who I last saw in the office 06/22/2022.Marland Kitchen  He is accompanied by his wife Jeremy Reeves  today.  He has a history of ischemic heart disease status post coronary artery bypass grafting in 1999 with a LIMA to his LAD, a vein to an obtuse marginal branch and to the RCA. Myoview performed February 2013 was nonischemic.    Since I saw him 1 year ago he continues to do well.  He is very active and recently cut 4 acres of land and did weed eating without symptoms.  He remains on Eliquis for his A-fib.   Current Meds  Medication Sig   abacavir-dolutegravir-lamiVUDine (TRIUMEQ) 600-50-300 MG tablet Take 1 tablet by mouth daily.   apixaban (ELIQUIS) 2.5 MG TABS tablet Take 1 tablet (2.5 mg total) by mouth 2 (two) times daily.   carvedilol (COREG) 6.25 MG tablet Take 1 tablet (6.25 mg total) by mouth 2 (two) times daily with a meal.   Cinnamon 500 MG capsule Take 1,000 mg by mouth daily.   Cranberry 360 MG CAPS Take by mouth.   mirabegron ER (MYRBETRIQ) 25 MG TB24 tablet Take 1 tablet (25 mg total) by mouth daily.   Multiple Vitamins-Minerals (MULTIVITAMIN WITH MINERALS) tablet Take 1 tablet by mouth daily.   niacin (NIASPAN) 1000 MG CR tablet TAKE 2 TABLETS AT BEDTIME   omeprazole (PRILOSEC) 20 MG capsule TAKE 1 CAPSULE EVERY DAY   pravastatin (PRAVACHOL) 40 MG tablet TAKE 1 TABLET EVERY DAY (OBTAIN FURTHER REFILLS FROM PRIMARY CARE PROVIDER)   vitamin E 400 UNIT capsule Take 400 Units by mouth daily.     Allergies  Allergen Reactions   Peanut-Containing Drug Products Swelling    Social History   Socioeconomic History   Marital status: Married    Spouse name: Not on file   Number of children: Not on file    Years of education: Not on file   Highest education level: Not on file  Occupational History   Not on file  Tobacco Use   Smoking status: Former    Current packs/day: 0.12    Average packs/day: 0.1 packs/day for 40.0 years (4.8 ttl pk-yrs)    Types: Cigarettes   Smokeless tobacco: Never   Tobacco comments:    07/04/2013 "I quit smoking before 2000"  Vaping Use   Vaping status: Never Used  Substance and Sexual Activity   Alcohol use: No   Drug use: No   Sexual activity: Not Currently    Partners: Female    Comment: declined condoms  Other Topics Concern   Not on file  Social History Narrative   Not on file   Social Determinants of Health   Financial Resource Strain: Low Risk  (05/31/2023)   Overall Financial Resource Strain (CARDIA)    Difficulty of Paying Living Expenses: Not hard at all  Food Insecurity: No Food Insecurity (05/31/2023)   Hunger Vital Sign    Worried About Running Out of Food in the Last Year: Never true    Ran Out of Food in the Last Year: Never true  Transportation Needs: No Transportation Needs (05/31/2023)   PRAPARE - Transportation    Lack of Transportation (Medical): No    Lack  of Transportation (Non-Medical): No  Physical Activity: Inactive (05/31/2023)   Exercise Vital Sign    Days of Exercise per Week: 0 days    Minutes of Exercise per Session: 0 min  Stress: No Stress Concern Present (05/31/2023)   [employer] of Occupational Health - Occupational Stress Questionnaire    Feeling of Stress : Not at all  Social Connections: Socially Integrated (05/31/2023)   Social Connection and Isolation Panel [NHANES]    Frequency of Communication with Friends and Family: Three times a week    Frequency of Social Gatherings with Friends and Family: More than three times a week    Attends Religious Services: More than 4 times per year    Active Member of [employer] or Organizations: Yes    Attends Engineer, structural: More than 4 times per year     Marital Status: Married  Catering manager Violence: Not At Risk (05/31/2023)   Humiliation, Afraid, Rape, and Kick questionnaire    Fear of Current or Ex-Partner: No    Emotionally Abused: No    Physically Abused: No    Sexually Abused: No     Review of Systems: General: negative for chills, fever, night sweats or weight changes.  Cardiovascular: negative for chest pain, dyspnea on exertion, edema, orthopnea, palpitations, paroxysmal nocturnal dyspnea or shortness of breath Dermatological: negative for rash Respiratory: negative for cough or wheezing Urologic: negative for hematuria Abdominal: negative for nausea, vomiting, diarrhea, bright red blood per rectum, melena, or hematemesis Neurologic: negative for visual changes, syncope, or dizziness All other systems reviewed and are otherwise negative except as noted above.    Blood pressure (!) 148/87, pulse (!) 57, height 5\' 7"  (1.702 m), weight 156 lb (70.8 kg).  General appearance: alert and no distress Neck: no adenopathy, no carotid bruit, no JVD, supple, symmetrical, trachea midline, and thyroid not enlarged, symmetric, no tenderness/mass/nodules Lungs: clear to auscultation bilaterally Heart: irregularly irregular rhythm Extremities: extremities normal, atraumatic, no cyanosis or edema Pulses: 2+ and symmetric Skin: Skin color, texture, turgor normal. 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  I personally reviewed this EKG.     ASSESSMENT AND PLAN:   ASHD (arteriosclerotic heart disease) History of CAD status post cardiac catheterization performed by Dr. Elsie Lincoln 05/02/1998 revealing three-vessel disease.  He ultimately underwent CABG with a LIMA to his LAD, vein to an obtuse marginal branch and RCA.  His last Myoview stress test performed February 2013 was  nonischemic.  He is active and asymptomatic.  Hyperlipidemia associated with type 2 diabetes mellitus (HCC) History of hyperlipidemia on Pravachol with lipid profile performed 03/17/2023 revealing total cholesterol of 133, LDL 59 and HDL of 57.  Hypertension associated with diabetes (HCC) History of essential hypertension blood pressure measured today at 148/87.  He checks his blood pressure at home and is usually significant lower than this.  He is on carvedilol.  Persistent atrial fibrillation (HCC) History of persistent a flutter rate controlled on Eliquis oral anticoagulation.     Runell Gess MD FACP,FACC,FAHA, [facility] 06/28/2023 2:11 PM

## 2023-06-28 NOTE — Assessment & Plan Note (Signed)
History of essential hypertension blood pressure measured today at 148/87.  He checks his blood pressure at home and is usually significant lower than this.  He is on carvedilol.

## 2023-06-28 NOTE — Assessment & Plan Note (Signed)
History of persistent a flutter rate controlled on Eliquis oral anticoagulation.

## 2023-06-28 NOTE — Assessment & Plan Note (Signed)
History of hyperlipidemia on Pravachol with lipid profile performed 03/17/2023 revealing total cholesterol of 133, LDL 59 and HDL of 57.

## 2023-06-28 NOTE — Patient Instructions (Signed)

## 2023-06-28 NOTE — Assessment & Plan Note (Signed)
History of CAD status post cardiac catheterization performed by Dr. Elsie Lincoln 05/02/1998 revealing three-vessel disease.  He ultimately underwent CABG with a LIMA to his LAD, vein to an obtuse marginal branch and RCA.  His last Myoview stress test performed February 2013 was nonischemic.  He is active and asymptomatic.

## 2023-07-04 ENCOUNTER — Ambulatory Visit
Admission: RE | Admit: 2023-07-04 | Discharge: 2023-07-04 | Disposition: A | Discharge status: Home / Self Care | Payer: MEDICARE | Source: Ambulatory Visit | Attending: Family Medicine | Admitting: Family Medicine

## 2023-07-04 DIAGNOSIS — R221 Localized swelling, mass and lump, neck: Secondary | ICD-10-CM | POA: Diagnosis not present

## 2023-07-05 ENCOUNTER — Other Ambulatory Visit (HOSPITAL_COMMUNITY): Payer: Self-pay

## 2023-07-09 ENCOUNTER — Other Ambulatory Visit: Payer: Self-pay | Admitting: Cardiovascular Disease

## 2023-07-11 ENCOUNTER — Other Ambulatory Visit: Payer: Self-pay

## 2023-07-11 DIAGNOSIS — L989 Disorder of the skin and subcutaneous tissue, unspecified: Secondary | ICD-10-CM

## 2023-07-14 DIAGNOSIS — Z23 Encounter for immunization: Secondary | ICD-10-CM | POA: Diagnosis not present

## 2023-08-01 ENCOUNTER — Telehealth: Payer: Self-pay

## 2023-08-01 NOTE — Telephone Encounter (Signed)
Pt called back and states she found the number to call and schedule with ENT and if she has any issues scheduling she will call us back

## 2023-08-01 NOTE — Telephone Encounter (Signed)
Good morning. This referral was sent on 07/11/23. Are you able to give me an update on this? I just spoke with the pt's wife and she can not remember if someone has called to schedule an appt.

## 2023-08-24 DIAGNOSIS — N3941 Urge incontinence: Secondary | ICD-10-CM | POA: Diagnosis not present

## 2023-08-24 DIAGNOSIS — N3281 Overactive bladder: Secondary | ICD-10-CM | POA: Diagnosis not present

## 2023-08-25 ENCOUNTER — Encounter (INDEPENDENT_AMBULATORY_CARE_PROVIDER_SITE_OTHER): Payer: Self-pay

## 2023-08-25 ENCOUNTER — Ambulatory Visit (INDEPENDENT_AMBULATORY_CARE_PROVIDER_SITE_OTHER): Payer: MEDICARE | Admitting: Otolaryngology

## 2023-08-25 VITALS — Ht 67.0 in | Wt 156.0 lb

## 2023-08-25 DIAGNOSIS — K118 Other diseases of salivary glands: Secondary | ICD-10-CM

## 2023-08-25 DIAGNOSIS — Z21 Asymptomatic human immunodeficiency virus [HIV] infection status: Secondary | ICD-10-CM | POA: Diagnosis not present

## 2023-08-25 NOTE — Progress Notes (Signed)
Dear Dr. Susann Givens, Here is my assessment for our mutual patient, Jeremy Reeves. Thank you for allowing me the opportunity to care for your patient. Please do not hesitate to contact me should you have any other questions. Sincerely, Dr. Jovita Kussmaul  Otolaryngology Clinic Note Referring provider: Dr. Susann Givens HPI:  Jeremy Reeves is a 82 y.o. male kindly referred by Dr. Susann Givens for evaluation of neck mass Patient reports that his PCP incidentally noted a right submandibular area neck mass (05/2023). Patient has not felt anything. Never noticed anything. He denies any significant pain, discomfort, other neck masses, ear pain, dysphagia, odynophagia, weight loss, B symptoms. No xerostomia or prior submandibular gland infections He does not smoke  Accompanied by wife  PMHx: BPH, GERD, HLD, HTN, A-fib, CKD, OAB, HIV (CD 4 437, quant not-detected)  H&N Surgery: denies Personal or FHx of bleeding dz or anesthesia difficulty: no   GLP-1: no  Tobacco: no. Alcohol: denies.   Independent Review of Additional Tests or Records:  Referral notes reviewed [place] (05/2023) imaging reviewed independently: right nodular area? - hyper/isoechoic which is also similar to the surrounding parenchyma; Unclear/ No neck nodes on [place] noted, query left side also has a small hyperechoic area. No parotid nodules    PMH/Meds/All/SocHx/FamHx/ROS:   Past Medical History:  Diagnosis Date   ASHD (arteriosclerotic heart disease)    BPH (benign prostatic hyperplasia)    Difficult intubation    anesthesia record 1999 / record on chart   Foley catheter in place 10/23/12   GERD (gastroesophageal reflux disease)    HH (hiatus hernia)    Hiatal hernia    HIV (human immunodeficiency virus infection) (HCC) dx'd 2014   Hyperlipidemia    Hypertension    Shingles 8/14   Type II diabetes mellitus (HCC)    pre diabetes     Past Surgical History:  Procedure Laterality Date   CARDIAC CATHETERIZATION  05/02/1998   Recommend  CABG   CARDIOVASCULAR STRESS TEST  12/01/2011   Perfusion defect consistent with diaphragmatic attenuation. There is no scintigraphic of inducible myocardial ischemia.    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 LIMA to the LAD, vein to obtuse marginal, and vein to RCA   IRRIGATION AND DEBRIDEMENT ABSCESS Right 12/04/2015   Procedure: MINOR INCISION AND DRAINAGE OF ABSCESS;  Surgeon: Betha Loa, MD;  Location: [facility];  Service: Orthopedics;  Laterality: Right;   TRANSTHORACIC ECHOCARDIOGRAM  03/29/2011   EF ~50%, mild-moderate inferior wall hypokinesis   TRANSURETHRAL RESECTION OF PROSTATE N/A 10/25/2013   Procedure: TRANSURETHRAL RESECTION OF THE PROSTATE WITH GYRUS BUTTON;  Surgeon: Bjorn Pippin, MD;  Location: [facility];  Service: Urology;  Laterality: N/A;    Family History  Problem Relation Age of Onset   Kidney disease Mother    Heart disease Father    Hypertension Brother      Social Connections: Socially Integrated (05/31/2023)   Social Connection and Isolation Panel [NHANES]    Frequency of Communication with Friends and Family: Three times a week    Frequency of Social Gatherings with Friends and Family: More than three times a week    Attends Religious Services: More than 4 times per year    Active Member of [employer] or Organizations: Yes    Attends Engineer, structural: More than 4 times per year    Marital Status: Married      Current Outpatient Medications:    abacavir-dolutegravir-lamiVUDine (TRIUMEQ) 600-50-300 MG tablet,  Take 1 tablet by mouth daily., Disp: 30 tablet, Rfl: 11   apixaban (ELIQUIS) 2.5 MG TABS tablet, Take 1 tablet (2.5 mg total) by mouth 2 (two) times daily., Disp: 180 tablet, Rfl: 1   carvedilol (COREG) 6.25 MG tablet, Take 1 tablet (6.25 mg total) by mouth 2 (two) times daily with a meal., Disp: 180 tablet, Rfl: 1   Cinnamon 500 MG capsule, Take 1,000 mg by mouth daily., Disp: , Rfl:    Cranberry 360 MG  CAPS, Take by mouth., Disp: , Rfl:    mirabegron ER (MYRBETRIQ) 25 MG TB24 tablet, Take 1 tablet (25 mg total) by mouth daily., Disp: 30 tablet, Rfl: 5   Multiple Vitamins-Minerals (MULTIVITAMIN WITH MINERALS) tablet, Take 1 tablet by mouth daily., Disp: , Rfl:    niacin (NIASPAN) 1000 MG CR tablet, TAKE 2 TABLETS AT BEDTIME, Disp: 180 tablet, Rfl: 3   omeprazole (PRILOSEC) 20 MG capsule, Take 1 capsule (20 mg total) by mouth daily., Disp: 90 capsule, Rfl: 3   pravastatin (PRAVACHOL) 40 MG tablet, TAKE 1 TABLET EVERY DAY (OBTAIN FURTHER REFILLS FROM PRIMARY CARE PROVIDER), Disp: 90 tablet, Rfl: 0   vitamin E 400 UNIT capsule, Take 400 Units by mouth daily., Disp: , Rfl:    Physical Exam:   Ht 5\' 7"  (1.702 m)   Wt 156 lb (70.8 kg)   BMI 24.43 kg/m   Salient findings:  CN II-XII intact  Bilateral EAC clear and TM intact with well pneumatized middle ear spaces Anterior rhinoscopy: Septum relatively midline No lesions of oral cavity/oropharynx; Easily expressible saliva from b/l submandibular and parotid ducts No obviously palpable neck masses/lymphadenopathy/thyromegaly; bilateral submandibular glands ptotic, but I am unable to appreciate a discrete nodule; submandibular areas feel soft No respiratory distress or stridor  Seprately Identifiable Procedures:  None  Impression & Plans:  Jeremy Reeves is a 82 y.o. male with history of HIV (controlled, follows ID) with: Right submandibular area irregularity? Incidentally found, although I am unable to appreciate a nodule there and it appears to be consistent with just a ptotic submandibular gland and relatively symmetric compared to left. [place] did not show any discrete nodules but small (<1cm) area where the gland parenchyma appears slightly different compared to right. It is unclear to me that this is pathologic, as he is completely asymptomatic. He does have HIV and certainly could be related to a small node v/s HIV associated salivary gland  disease (although his HIV is well controlled and he does not have parotid nodules).  We discussed further workup, which I recommend with MRI with contrast since he has CKD. We could consider CT without as well.  Patient and wife do not wish to do this and would like to observe. We did discuss risks of this if there is pathology As a result, we will see him back in 6 months. In the interm, should he note any symptoms (see above in HPI) or should he change his mind, I advised him to call back   - f/u 6 months   Thank you for allowing me the opportunity to care for your patient. Please do not hesitate to contact me should you have any other questions.  Sincerely, Jovita Kussmaul, MD Otolarynoglogist (ENT), [facility] Phone: [phone] Fax: [phone]  08/25/2023, 11:43 AM   MDM:  Level 4 Complexity/Problems addressed: new problem, unknown prognosis Data complexity: mod - independent review of [place] - Morbidity: unclear

## 2023-10-03 ENCOUNTER — Other Ambulatory Visit: Payer: Self-pay | Admitting: Cardiovascular Disease

## 2023-10-03 DIAGNOSIS — I4819 Other persistent atrial fibrillation: Secondary | ICD-10-CM

## 2023-10-03 NOTE — Telephone Encounter (Signed)
Prescription refill request for Eliquis received. Indication:afib Last office visit:9/24 Scr:1.63  5/24 Age: 82 Weight:70.8  kg  Prescription refilled

## 2023-10-20 ENCOUNTER — Telehealth: Payer: MEDICARE | Admitting: Medical

## 2023-10-20 ENCOUNTER — Other Ambulatory Visit: Payer: Self-pay | Admitting: Medical

## 2023-10-20 ENCOUNTER — Telehealth: Payer: Self-pay | Admitting: Family Medicine

## 2023-10-20 VITALS — BP 115/79 | HR 68 | Wt 153.0 lb

## 2023-10-20 DIAGNOSIS — J069 Acute upper respiratory infection, unspecified: Secondary | ICD-10-CM

## 2023-10-20 DIAGNOSIS — E119 Type 2 diabetes mellitus without complications: Secondary | ICD-10-CM

## 2023-10-20 DIAGNOSIS — M79671 Pain in right foot: Secondary | ICD-10-CM

## 2023-10-20 DIAGNOSIS — R051 Acute cough: Secondary | ICD-10-CM

## 2023-10-20 DIAGNOSIS — E11A Type 2 diabetes mellitus without complications in remission: Secondary | ICD-10-CM

## 2023-10-20 MED ORDER — PROMETHAZINE-DM 6.25-15 MG/5ML PO SYRP: 5.0000 mL | ORAL_SOLUTION | Freq: Four times a day (QID) | ORAL | 0 refills | Status: DC | PRN

## 2023-10-20 MED ORDER — BENZONATATE 200 MG PO CAPS: 200.0000 mg | ORAL_CAPSULE | Freq: Three times a day (TID) | ORAL | 0 refills | Status: DC | PRN

## 2023-10-20 NOTE — Progress Notes (Signed)
 Subjective:     Patient ID: Jeremy Reeves, male   DOB: 1941/07/24, 83 y.o.   MRN: [id number]  This visit type was conducted due to national recommendations for restrictions regarding the COVID-19 Pandemic (e.g. social distancing) in an effort to limit this patient's exposure and mitigate transmission in our community.  Due to their co-morbid illnesses, this patient is at least at moderate risk for complications without adequate follow up.  This format is felt to be most appropriate for this patient at this time.    Documentation for virtual audio and video telecommunications through [facility] encounter:  The patient was located at home. The provider was located in the office. The patient did consent to this visit and is aware of possible charges through their insurance for this visit.  The other persons participating in this telemedicine service were wife. Time spent on call was 20 minutes and in review of previous records 20 minutes total.  This virtual service is not related to other E/M service within previous 7 days.   HPI Chief Complaint  Patient presents with   Cough    Cough and congestion x week and half. Covid negative today   Virtual consult for illness.  He notes over a week now of cough, congestion, yellow mucous.  Doesn't feel real bad.  Sometimes cough waking him up at night.  No fever, no body aches or chills. No NVD.  No SOB or wheezing.  Has used some robitussin.  No sore throat or ear pain.    He did a home COVID test which was negative today.  Wife had similar 3 weeks ago but hers was allergies or virus.   They check glucose daily and last few days ok.    He also reports wanting to see a foot doctor.  He is a diabetic and would like nail care.  He also gets some pain sometimes  in his right foot.  No specific injury or trauma or foot lesions.  No other aggravating or relieving factors. No other complaint.  Past Medical History:  Diagnosis Date   ASHD  (arteriosclerotic heart disease)    BPH (benign prostatic hyperplasia)    Difficult intubation    anesthesia record 1999 / record on chart   Foley catheter in place 10/23/12   GERD (gastroesophageal reflux disease)    HH (hiatus hernia)    Hiatal hernia    HIV (human immunodeficiency virus infection) (HCC) dx'd 2014   Hyperlipidemia    Hypertension    Shingles 8/14   Type II diabetes mellitus (HCC)    pre diabetes   Current Outpatient Medications on File Prior to Visit  Medication Sig Dispense Refill   abacavir -dolutegravir -lamiVUDine  (TRIUMEQ ) 600-50-300 MG tablet Take 1 tablet by mouth daily. 30 tablet 11   carvedilol  (COREG ) 6.25 MG tablet Take 1 tablet (6.25 mg total) by mouth 2 (two) times daily with a meal. 180 tablet 1   Cinnamon 500 MG capsule Take 1,000 mg by mouth daily.     Cranberry 360 MG CAPS Take by mouth.     ELIQUIS  2.5 MG TABS tablet TAKE 1 TABLET TWICE DAILY 180 tablet 3   mirabegron  ER (MYRBETRIQ ) 25 MG TB24 tablet Take 1 tablet (25 mg total) by mouth daily. 30 tablet 5   Multiple Vitamins-Minerals (MULTIVITAMIN WITH MINERALS) tablet Take 1 tablet by mouth daily.     niacin  (NIASPAN ) 1000 MG CR tablet TAKE 2 TABLETS AT BEDTIME 180 tablet 3   omeprazole  (PRILOSEC) 20 MG  capsule Take 1 capsule (20 mg total) by mouth daily. 90 capsule 3   pravastatin  (PRAVACHOL ) 40 MG tablet TAKE 1 TABLET EVERY DAY (OBTAIN FURTHER REFILLS FROM PRIMARY CARE PROVIDER) 90 tablet 0   vitamin E 400 UNIT capsule Take 400 Units by mouth daily.     No current facility-administered medications on file prior to visit.    Review of Systems As in subjective    Objective:   Physical Exam Due to coronavirus pandemic stay at home measures, patient visit was virtual and they were not examined in person.   BP 115/79   Pulse 68   Wt 153 lb (69.4 kg)   BMI 23.96 kg/m   General: Well-developed well-nourished no acute distress No labored breathing or wheezing Unable to examine foot through the  video      Assessment:     Encounter Diagnoses  Name Primary?   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  Can use Tessalon  Perles as needed.  Continue Robitussin over-the-counter, continue good rest and hydration.  We will use a watch and wait approach at this point.  They declined concern for coming in for vital signs or chest x-ray referral  Right foot pain, diabetic, routine footcare-referral to podiatry at their request  Quintus was seen today for cough.  Diagnoses and all orders for this visit:  Acute cough  Upper respiratory tract infection, unspecified type  Type 2 diabetes mellitus in remission [facility]) -     Ambulatory referral to Podiatry  Right foot pain -     Ambulatory referral to Podiatry  Other orders -     benzonatate  (TESSALON ) 200 MG capsule; Take 1 capsule (200 mg total) by mouth 3 (three) times daily as needed for cough.    F/u prn

## 2023-10-20 NOTE — Telephone Encounter (Signed)
 Benzonatate 200 mg capsules  Drug not covered by patient plan. The preferred alternative is "ALT Drug therapy preferred product required"

## 2023-10-22 ENCOUNTER — Other Ambulatory Visit: Payer: Self-pay | Admitting: Family Medicine

## 2023-10-22 DIAGNOSIS — E785 Hyperlipidemia, unspecified: Secondary | ICD-10-CM

## 2023-10-22 DIAGNOSIS — E1169 Type 2 diabetes mellitus with other specified complication: Secondary | ICD-10-CM

## 2023-11-02 ENCOUNTER — Other Ambulatory Visit (HOSPITAL_COMMUNITY): Payer: Self-pay

## 2023-11-02 ENCOUNTER — Other Ambulatory Visit: Payer: Self-pay

## 2023-11-02 ENCOUNTER — Other Ambulatory Visit: Payer: Self-pay | Admitting: Pharmacist

## 2023-11-02 ENCOUNTER — Encounter: Payer: Self-pay | Admitting: Internal Medicine

## 2023-11-02 ENCOUNTER — Ambulatory Visit (INDEPENDENT_AMBULATORY_CARE_PROVIDER_SITE_OTHER): Payer: MEDICARE | Admitting: Internal Medicine

## 2023-11-02 VITALS — BP 135/88 | HR 69 | Resp 16 | Ht 67.0 in | Wt 153.0 lb

## 2023-11-02 DIAGNOSIS — N183 Chronic kidney disease, stage 3 unspecified: Secondary | ICD-10-CM

## 2023-11-02 DIAGNOSIS — B20 Human immunodeficiency virus [HIV] disease: Secondary | ICD-10-CM

## 2023-11-02 DIAGNOSIS — R630 Anorexia: Secondary | ICD-10-CM

## 2023-11-02 DIAGNOSIS — Z79899 Other long term (current) drug therapy: Secondary | ICD-10-CM

## 2023-11-02 MED ORDER — DOVATO 50-300 MG PO TABS
1.0000 | ORAL_TABLET | Freq: Every day | ORAL | 11 refills | Status: DC
  Filled 2023-11-02 (×4): qty 30, 30d supply, fill #0
  Filled 2023-11-22: qty 30, 30d supply, fill #1
  Filled 2023-12-30: qty 30, 30d supply, fill #2
  Filled 2024-02-07: qty 30, 30d supply, fill #3
  Filled 2024-03-13: qty 30, 30d supply, fill #4
  Filled 2024-04-16: qty 30, 30d supply, fill #5
  Filled 2024-05-04 – 2024-05-09 (×2): qty 30, 30d supply, fill #6
  Filled 2024-06-01: qty 30, 30d supply, fill #7
  Filled 2024-07-04: qty 30, 30d supply, fill #8
  Filled 2024-07-31: qty 30, 30d supply, fill #9
  Filled 2024-09-04: qty 30, 30d supply, fill #10
  Filled 2024-09-28: qty 30, 30d supply, fill #11

## 2023-11-02 NOTE — Progress Notes (Addendum)
 Specialty Pharmacy Initial Fill Coordination Note  Jeremy Reeves is a 84 y.o. male contacted today regarding initial fill of specialty medication(s) No data recorded  Patient requested Delivery   Delivery date: 11/07/23   Verified address: [address] [place] [address]   Medication will be filled on 11/04/23.   Patient is aware of $0 copayment.

## 2023-11-02 NOTE — Progress Notes (Signed)
RFV: follow up for hiv disease  Patient ID: Jeremy Reeves, male   DOB: October 03, 1941, 83 y.o.   MRN: [id number]  HPI 83yo M with HIV disease,  currently on triumeq,  cd 4 count of 437/VL<20 in may 2024. Continues to have excellent adherence. His wife reports some memory issues/forgetfulness. And also has some Loss of appetite  No B symptoms  Outpatient Encounter Medications as of 11/02/2023  Medication Sig   abacavir-dolutegravir-lamiVUDine (TRIUMEQ) 600-50-300 MG tablet Take 1 tablet by mouth daily.   benzonatate (TESSALON) 200 MG capsule Take 1 capsule (200 mg total) by mouth 3 (three) times daily as needed for cough.   carvedilol (COREG) 6.25 MG tablet Take 1 tablet (6.25 mg total) by mouth 2 (two) times daily with a meal.   Cinnamon 500 MG capsule Take 1,000 mg by mouth daily.   Cranberry 360 MG CAPS Take by mouth.   ELIQUIS 2.5 MG TABS tablet TAKE 1 TABLET TWICE DAILY   mirabegron ER (MYRBETRIQ) 25 MG TB24 tablet Take 1 tablet (25 mg total) by mouth daily.   Multiple Vitamins-Minerals (MULTIVITAMIN WITH MINERALS) tablet Take 1 tablet by mouth daily.   niacin (NIASPAN) 1000 MG CR tablet TAKE 2 TABLETS AT BEDTIME   omeprazole (PRILOSEC) 20 MG capsule Take 1 capsule (20 mg total) by mouth daily.   pravastatin (PRAVACHOL) 40 MG tablet TAKE 1 TABLET EVERY DAY (OBTAIN FURTHER REFILLS FROM PRIMARY CARE PROVIDER)   promethazine-dextromethorphan (PROMETHAZINE-DM) 6.25-15 MG/5ML syrup Take 5 mLs by mouth 4 (four) times daily as needed for cough.   vitamin E 400 UNIT capsule Take 400 Units by mouth daily.   No facility-administered encounter medications on file as of 11/02/2023.     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 He appears well-developed and well-nourished. No distress.  HENT:  Mouth/Throat: Oropharynx is clear and moist. No oropharyngeal exudate.  Cardiovascular: Normal rate, regular rhythm and normal heart sounds. Exam reveals no gallop and no friction rub.  No murmur heard.  Pulmonary/Chest: Effort normal and breath sounds normal. No respiratory distress. He has no wheezes.  Abdominal: Soft. Bowel sounds are normal. He exhibits no distension. There is no tenderness.  Lymphadenopathy:  He has no cervical adenopathy.  Neurological: He is alert and oriented to person, place, and time.  Skin: Skin is warm and dry. No rash noted. No erythema.  Psychiatric: He has a  normal mood and affect. His behavior is normal.   Lab Results  Component Value Date   CD4TCELL 39 03/17/2023   Lab Results  Component Value Date   CD4TABS 437 03/17/2023   CD4TABS 513 02/22/2022   CD4TABS 459 03/23/2021   Lab Results  Component Value Date   HIV1RNAQUANT Not Detected 03/17/2023   Lab Results  Component Value Date   HEPBSAB NEG 05/24/2013   Lab Results  Component Value Date   LABRPR NON-REACTIVE 03/17/2023    CBC Lab Results  Component Value  Date   WBC 4.2 03/17/2023   RBC 5.43 03/17/2023   HGB 16.1 03/17/2023   HCT 48.2 03/17/2023   PLT 96 (L) 03/17/2023   MCV 88.8 03/17/2023   MCH 29.7 03/17/2023   MCHC 33.4 03/17/2023   RDW 14.6 03/17/2023   LYMPHSABS 1,268 03/17/2023   MONOABS 495 04/05/2017   EOSABS 109 03/17/2023    BMET Lab Results  Component Value Date   NA 143 03/17/2023   K 4.2 03/17/2023   CL 107 03/17/2023   CO2 27 03/17/2023   GLUCOSE 60 (L) 03/17/2023   BUN 14 03/17/2023   CREATININE 1.63 (H) 03/17/2023   CALCIUM 9.3 03/17/2023   GFRNONAA 40 (L) 03/23/2021   GFRAA 47 (L) 03/23/2021      Assessment and Plan HIV disease = check to see still virologically controlled Will do labs, switch from triumeq to dovato to help with pill size slightly  Long term medication management /CKD 3 = will check cr  Loss of appetite/unintentional weight loss = recommend to try to gain 1/2 lb per week. Try to do Carnation breakfast drink to increase calorie intake.  Follow up also with pcp to see gaining back weight or if furhter work up is needed

## 2023-11-02 NOTE — Progress Notes (Signed)
 Specialty Pharmacy Initiation Note   Jeremy Reeves is a 83 y.o. male who will be followed by the specialty pharmacy service for RxSp HIV    Review of administration, indication, effectiveness, safety, potential side effects, storage/disposable, and missed dose instructions occurred today for patient's specialty medication(s) Dolutegravir -lamiVUDine  (Dovato )     Patient/Caregiver did not have any additional questions or concerns.   Patient's therapy is appropriate to: Initiate    Goals Addressed             This Visit's Progress    Achieve Undetectable HIV Viral Load < 20       Patient is on track. Patient will maintain adherence      Comply with lab assessments       Patient is on track. Patient will adhere to provider and/or lab appointments      Increase CD4 count until steady state       Patient is on track. Patient will maintain adherence         Sonya Duster Specialty Pharmacist

## 2023-11-02 NOTE — Patient Instructions (Signed)
 Carnation breakfast drink to help with increasing calories

## 2023-11-03 ENCOUNTER — Other Ambulatory Visit (HOSPITAL_COMMUNITY): Payer: Self-pay

## 2023-11-04 ENCOUNTER — Other Ambulatory Visit: Payer: Self-pay

## 2023-11-04 ENCOUNTER — Other Ambulatory Visit (HOSPITAL_COMMUNITY): Payer: Self-pay

## 2023-11-05 LAB — CBC WITH DIFFERENTIAL/PLATELET
Absolute Lymphocytes: 996 {cells}/uL (ref 850–3900)
Absolute Monocytes: 410 {cells}/uL (ref 200–950)
Basophils Absolute: 41 {cells}/uL (ref 0–200)
Basophils Relative: 1 %
Eosinophils Absolute: 119 {cells}/uL (ref 15–500)
Eosinophils Relative: 2.9 %
HCT: 47.7 % (ref 38.5–50.0)
Hemoglobin: 16 g/dL (ref 13.2–17.1)
MCH: 29.2 pg (ref 27.0–33.0)
MCHC: 33.5 g/dL (ref 32.0–36.0)
MCV: 87 fL (ref 80.0–100.0)
MPV: 11.2 fL (ref 7.5–12.5)
Monocytes Relative: 10 %
Neutro Abs: 2534 {cells}/uL (ref 1500–7800)
Neutrophils Relative %: 61.8 %
Platelets: 76 10*3/uL — ABNORMAL LOW (ref 140–400)
RBC: 5.48 10*6/uL (ref 4.20–5.80)
RDW: 14.4 % (ref 11.0–15.0)
Total Lymphocyte: 24.3 %
WBC: 4.1 10*3/uL (ref 3.8–10.8)

## 2023-11-05 LAB — T-HELPER CELLS (CD4) COUNT (NOT AT ARMC)
Absolute CD4: 475 {cells}/uL — ABNORMAL LOW (ref 490–1740)
CD4 T Helper %: 46 % (ref 30–61)
Total lymphocyte count: 1028 {cells}/uL (ref 850–3900)

## 2023-11-05 LAB — HIV-1 RNA QUANT-NO REFLEX-BLD
HIV 1 RNA Quant: NOT DETECTED {copies}/mL
HIV-1 RNA Quant, Log: NOT DETECTED {Log}

## 2023-11-05 LAB — COMPLETE METABOLIC PANEL WITH GFR
AG Ratio: 1.4 (calc) (ref 1.0–2.5)
ALT: 21 U/L (ref 9–46)
AST: 25 U/L (ref 10–35)
Albumin: 4.3 g/dL (ref 3.6–5.1)
Alkaline phosphatase (APISO): 65 U/L (ref 35–144)
BUN/Creatinine Ratio: 12 (calc) (ref 6–22)
BUN: 16 mg/dL (ref 7–25)
CO2: 31 mmol/L (ref 20–32)
Calcium: 9.5 mg/dL (ref 8.6–10.3)
Chloride: 102 mmol/L (ref 98–110)
Creat: 1.36 mg/dL — ABNORMAL HIGH (ref 0.70–1.22)
Globulin: 3 g/dL (ref 1.9–3.7)
Glucose, Bld: 82 mg/dL (ref 65–99)
Potassium: 3.9 mmol/L (ref 3.5–5.3)
Sodium: 143 mmol/L (ref 135–146)
Total Bilirubin: 0.8 mg/dL (ref 0.2–1.2)
Total Protein: 7.3 g/dL (ref 6.1–8.1)
eGFR: 52 mL/min/{1.73_m2} — ABNORMAL LOW (ref >=60)

## 2023-11-09 ENCOUNTER — Telehealth: Payer: Self-pay | Admitting: Internal Medicine

## 2023-11-09 ENCOUNTER — Other Ambulatory Visit: Payer: Self-pay | Admitting: Internal Medicine

## 2023-11-09 DIAGNOSIS — D696 Thrombocytopenia, unspecified: Secondary | ICD-10-CM

## 2023-11-10 NOTE — Telephone Encounter (Signed)
Left voice message to call back

## 2023-11-14 ENCOUNTER — Ambulatory Visit: Payer: MEDICARE | Admitting: Podiatry

## 2023-11-15 ENCOUNTER — Other Ambulatory Visit: Payer: MEDICARE

## 2023-11-15 ENCOUNTER — Ambulatory Visit
Admission: RE | Admit: 2023-11-15 | Discharge: 2023-11-15 | Disposition: A | Discharge status: Home / Self Care | Payer: MEDICARE | Source: Ambulatory Visit | Attending: Internal Medicine | Admitting: Internal Medicine

## 2023-11-15 DIAGNOSIS — D696 Thrombocytopenia, unspecified: Secondary | ICD-10-CM

## 2023-11-16 ENCOUNTER — Other Ambulatory Visit: Payer: Self-pay | Admitting: Cardiovascular Disease

## 2023-11-17 ENCOUNTER — Other Ambulatory Visit (HOSPITAL_COMMUNITY): Payer: Self-pay

## 2023-11-21 ENCOUNTER — Ambulatory Visit (INDEPENDENT_AMBULATORY_CARE_PROVIDER_SITE_OTHER): Payer: MEDICARE | Admitting: Podiatry

## 2023-11-21 ENCOUNTER — Encounter: Payer: Self-pay | Admitting: Podiatry

## 2023-11-21 ENCOUNTER — Other Ambulatory Visit (HOSPITAL_COMMUNITY): Payer: Self-pay

## 2023-11-21 DIAGNOSIS — B353 Tinea pedis: Secondary | ICD-10-CM | POA: Diagnosis not present

## 2023-11-21 DIAGNOSIS — M79674 Pain in right toe(s): Secondary | ICD-10-CM

## 2023-11-21 DIAGNOSIS — E11628 Type 2 diabetes mellitus with other skin complications: Secondary | ICD-10-CM

## 2023-11-21 DIAGNOSIS — M79675 Pain in left toe(s): Secondary | ICD-10-CM

## 2023-11-21 DIAGNOSIS — M2042 Other hammer toe(s) (acquired), left foot: Secondary | ICD-10-CM

## 2023-11-21 DIAGNOSIS — L84 Corns and callosities: Secondary | ICD-10-CM

## 2023-11-21 DIAGNOSIS — B351 Tinea unguium: Secondary | ICD-10-CM | POA: Diagnosis not present

## 2023-11-21 DIAGNOSIS — M2012 Hallux valgus (acquired), left foot: Secondary | ICD-10-CM | POA: Diagnosis not present

## 2023-11-21 MED ORDER — KETOCONAZOLE 2 % EX CREA: 1.0000 | TOPICAL_CREAM | Freq: Every day | CUTANEOUS | 2 refills | Status: AC

## 2023-11-21 NOTE — Progress Notes (Signed)
Chief Complaint  Patient presents with   Diabetes    Diabetic foot exam - last A1c was 6.0, feet were bothering him some, but bought new shoes and that is now better, would like the toenails trimmed, takes eliquis   New Patient (Initial Visit)   HPI: 83 y.o. male presents today with several concerns.  He is requesting nail care.  He is a type II diabetic.  States his last A1c was 6.0.  Denies any burning or numbness in his feet.  He is on a blood thinner.  That he also has a callus on the left heel he would like shaved.  He was referred for a diabetic overall foot check.  Past Medical History:  Diagnosis Date   ASHD (arteriosclerotic heart disease)    BPH (benign prostatic hyperplasia)    Difficult intubation    anesthesia record 1999 / record on chart   Foley catheter in place 10/23/12   GERD (gastroesophageal reflux disease)    HH (hiatus hernia)    Hiatal hernia    HIV (human immunodeficiency virus infection) (HCC) dx'd 2014   Hyperlipidemia    Hypertension    Shingles 8/14   Type II diabetes mellitus (HCC)    pre diabetes    Past Surgical History:  Procedure Laterality Date   CARDIAC CATHETERIZATION  05/02/1998   Recommend CABG   CARDIOVASCULAR STRESS TEST  12/01/2011   Perfusion defect consistent with diaphragmatic attenuation. There is no scintigraphic of inducible myocardial ischemia.    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 LIMA to the LAD, vein to obtuse marginal, and vein to RCA   IRRIGATION AND DEBRIDEMENT ABSCESS Right 12/04/2015   Procedure: MINOR INCISION AND DRAINAGE OF ABSCESS;  Surgeon: Betha Loa, MD;  Location: [facility];  Service: Orthopedics;  Laterality: Right;   TRANSTHORACIC ECHOCARDIOGRAM  03/29/2011   EF ~50%, mild-moderate inferior wall hypokinesis   TRANSURETHRAL RESECTION OF PROSTATE N/A 10/25/2013   Procedure: TRANSURETHRAL RESECTION OF THE PROSTATE WITH GYRUS BUTTON;  Surgeon: Bjorn Pippin, MD;  Location:  [facility];  Service: Urology;  Laterality: N/A;    Allergies  Allergen Reactions   Peanut-Containing Drug Products Swelling    Physical Exam: General: The patient is alert and oriented x3 in no acute distress.  Dermatology: Skin is warm, dry and supple bilateral lower extremities.  The left first interspace is macerated without ulceration.  The toenails are 3 mm thick with yellow discoloration, subungual debris, distal onycholysis, and pain with compression.  They are all elongated.  The right third toenail is approximately 6 mm thick and gryphotic.  This is more tender than the rest on palpation due to the thickness and length.  There is a hyperkeratotic lesion on the plantar medial aspect of the left heel.  No ulceration is noted.  Vascular: Palpable pedal pulses bilaterally. Capillary refill within normal limits.  No appreciable edema.  No erythema or calor.  Neurological: Protective sensation intact via Semmes Weinstein monofilament to bilateral foot.  Manual muscle testing 5/5 bilateral  Musculoskeletal Exam: There is lateral deviation of the left hallux which is not fully reducible with manipulation.  There is a bony prominence on the medial aspect of the first metatarsal head on the left foot.  There is contracture of the left second toe with medial deviation of this toe at the MP J level, which is causing the second toe to overlap the hallux.  Assessment/Plan of  Care: 1. Pain due to onychomycosis of toenails of both feet   2. Hallux valgus, left   3. Hammertoe of left foot   4. Tinea pedis of left foot   5. Callus of foot   6. Type 2 diabetes mellitus with other skin complication, without long-term current use of insulin (HCC)      Meds ordered this encounter  Medications   ketoconazole (NIZORAL) 2 % cream    Sig: Apply 1 Application topically daily. Apply 1gm between affected toes once daily    Dispense:  60 g    Refill:  2   FOR HOME USE ONLY DME DIABETIC SHOE order was  written and patient was scheduled for diabetic shoe consult with our pedorthist in the near future.  He qualifies for the diabetic shoe program due to his diabetes as well as bunion and hammertoe deformities and preulcerative callus on the heel.  Patient will need diabetic shoes with a deep toe box to accommodate his bunion and hammertoe deformities.  Stressed the importance of proper blood sugar control with the patient regarding his diabetes and prevention of neuropathy.  The mycotic toenails were sharply debrided with sterile nail nippers and a power debriding bur x 10 to decrease girth and length.  The hyperkeratotic lesion on the left heel was shaved with a sterile #313 blade.  Prescription for ketoconazole 2% cream was sent to his pharmacy for him to apply once daily to the left first interspace.  Patient would like to get on a regular schedule for diabetic footcare.  He will follow-up in 3 months  Mirza Fessel D. Vincent Ehrler, DPM, FACFAS [facility]     [address] [place], [place] [address]                Office [phone]  Fax [phone]

## 2023-11-22 ENCOUNTER — Other Ambulatory Visit (HOSPITAL_COMMUNITY): Payer: Self-pay

## 2023-11-22 ENCOUNTER — Other Ambulatory Visit: Payer: Self-pay

## 2023-11-22 NOTE — Progress Notes (Signed)
 Specialty Pharmacy Refill Coordination Note  Jeremy Reeves is a 83 y.o. male contacted today regarding refills of specialty medication(s) Dolutegravir -lamiVUDine  (Dovato )   Patient requested Delivery   Delivery date: 12/06/23   Verified address: [address] [place] [address]   Medication will be filled on 12/05/23.

## 2023-12-06 ENCOUNTER — Other Ambulatory Visit (HOSPITAL_COMMUNITY): Payer: Self-pay

## 2023-12-21 ENCOUNTER — Other Ambulatory Visit: Payer: Self-pay | Admitting: Family Medicine

## 2023-12-21 ENCOUNTER — Other Ambulatory Visit (HOSPITAL_COMMUNITY): Payer: Self-pay

## 2023-12-21 DIAGNOSIS — I152 Hypertension secondary to endocrine disorders: Secondary | ICD-10-CM

## 2023-12-30 ENCOUNTER — Other Ambulatory Visit: Payer: Self-pay

## 2023-12-30 NOTE — Progress Notes (Signed)
 Specialty Pharmacy Refill Coordination Note  Jeremy Reeves is a 83 y.o. male contacted today regarding refills of specialty medication(s) Dolutegravir-lamiVUDine (Dovato)   Spoke with patient's wife  Patient requested Delivery   Delivery date: 01/10/24   Verified address: [address] [place] [address]   Medication will be filled on 03.25.25.

## 2024-01-10 ENCOUNTER — Other Ambulatory Visit: Payer: Self-pay

## 2024-01-30 ENCOUNTER — Ambulatory Visit: Payer: MEDICARE | Admitting: Internal Medicine

## 2024-02-05 ENCOUNTER — Encounter (HOSPITAL_COMMUNITY): Payer: Self-pay

## 2024-02-05 ENCOUNTER — Ambulatory Visit (HOSPITAL_COMMUNITY)
Admission: EM | Admit: 2024-02-05 | Discharge: 2024-02-05 | Disposition: A | Discharge status: Home / Self Care | Payer: MEDICARE | Attending: Emergency Medicine | Admitting: Emergency Medicine

## 2024-02-05 DIAGNOSIS — J22 Unspecified acute lower respiratory infection: Secondary | ICD-10-CM

## 2024-02-05 MED ORDER — AZITHROMYCIN 250 MG PO TABS
250.0000 mg | ORAL_TABLET | Freq: Every day | ORAL | 0 refills | Status: DC
Start: 2024-02-05 — End: 2024-04-11

## 2024-02-05 MED ORDER — AMOXICILLIN-POT CLAVULANATE 875-125 MG PO TABS: 1.0000 | ORAL_TABLET | Freq: Two times a day (BID) | ORAL | 0 refills | Status: DC

## 2024-02-05 NOTE — ED Triage Notes (Signed)
 Pt states that he has a cough. X3 days Pt states that he noticed a little bit of blood in the mucus.

## 2024-02-05 NOTE — ED Provider Notes (Signed)
 [facility]    CSN: [id number] Arrival date & time: 02/05/24  1010      History   Chief Complaint Chief Complaint  Patient presents with   Cough    HPI Jeremy Reeves is a 83 y.o. male. Per pt and his wife, has been coughing for 3 days and getting worse. Per wife, he has a little runny nose, pt hasn't noticed it. Coughing up yellow or white sputum that is thick. Did have some blood in it this morning. Denies SOB. Is taking Eliquis  as prescribed. Tried Robitussin DM early in illness and it helped but didn't continue it. Denies sore throat, headache, sinus pressure, post nasal drainage. Appetite has been poor lately but this isn't new; is drinking sufficiently and staying hydrated.    Cough   Past Medical History:  Diagnosis Date   ASHD (arteriosclerotic heart disease)    BPH (benign prostatic hyperplasia)    Difficult intubation    anesthesia record 1999 / record on chart   Foley catheter in place 10/23/12   GERD (gastroesophageal reflux disease)    HH (hiatus hernia)    Hiatal hernia    HIV (human immunodeficiency virus infection) (HCC) dx'd 2014   Hyperlipidemia    Hypertension    Shingles 8/14   Type II diabetes mellitus (HCC)    pre diabetes    Patient Active Problem List   Diagnosis Date Noted   OAB (overactive bladder) 06/12/2023   History of shingles 04/14/2022   Stage 3a chronic kidney disease (HCC) 04/09/2021   Persistent atrial fibrillation (HCC) 08/02/2019   GERD (gastroesophageal reflux disease) 07/05/2013   HIV disease (HCC) 05/24/2013   Hypertension associated with diabetes (HCC) 01/25/2013   ASHD (arteriosclerotic heart disease) 03/18/2011   BPH (benign prostatic hyperplasia) 03/18/2011   Hyperlipidemia associated with type 2 diabetes mellitus (HCC) 03/18/2011   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 There is no scintigraphic of inducible myocardial ischemia.    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 LIMA to the LAD, vein to obtuse marginal, and vein to RCA   IRRIGATION AND DEBRIDEMENT ABSCESS Right 12/04/2015   Procedure: MINOR INCISION AND DRAINAGE OF ABSCESS;  Surgeon: Brunilda Capra, MD;  Location: [facility];  Service: Orthopedics;  Laterality: Right;   TRANSTHORACIC ECHOCARDIOGRAM  03/29/2011   EF ~50%, mild-moderate inferior wall hypokinesis   TRANSURETHRAL RESECTION OF PROSTATE N/A 10/25/2013   Procedure: TRANSURETHRAL RESECTION OF THE PROSTATE WITH GYRUS BUTTON;  Surgeon: Homero Luster, MD;  Location: [facility];  Service: Urology;  Laterality: N/A;       Home Medications    Prior to Admission medications   Medication Sig Start Date End Date Taking? Authorizing Provider  abacavir -dolutegravir -lamiVUDine  (TRIUMEQ ) 600-50-300 MG tablet Take 1 tablet by mouth daily. 03/17/23  Yes Liane Redman, MD  amoxicillin -clavulanate (AUGMENTIN ) 875-125 MG tablet Take 1 tablet by mouth 2 (two) times daily. 02/05/24  Yes Blinda Burger, NP  azithromycin  (ZITHROMAX ) 250 MG tablet Take 1 tablet (250 mg total) by mouth daily. Take first 2 tablets together, then 1 every day until finished. 02/05/24  Yes Blinda Burger, NP  benzonatate  (TESSALON ) 200 MG capsule Take 1 capsule (200 mg total) by mouth 3 (three) times daily  as needed for cough. 10/20/23  Yes Tysinger, Christiane Cowing, PA-C  carvedilol  (COREG ) 6.25 MG tablet TAKE 1 TABLET TWICE DAILY WITH MEALS 12/21/23  Yes Lalonde, Trestin C, MD  Cinnamon 500 MG capsule Take 1,000 mg by mouth daily.   Yes [provider]  Cranberry 360 MG CAPS Take by mouth.   Yes [provider]  dolutegravir -lamiVUDine  (DOVATO ) 50-300 MG tablet Take 1 tablet by mouth daily. 11/02/23  Yes Liane Redman, MD  ELIQUIS  2.5 MG TABS tablet  TAKE 1 TABLET TWICE DAILY 10/03/23  Yes Avanell Leigh, MD  ketoconazole  (NIZORAL ) 2 % cream Apply 1 Application topically daily. Apply 1gm between affected toes once daily 11/21/23  Yes McCaughan, Dia D, DPM  mirabegron  ER (MYRBETRIQ ) 25 MG TB24 tablet Take 1 tablet (25 mg total) by mouth daily. 09/06/22  Yes Watson Hacking, MD  Multiple Vitamins-Minerals (MULTIVITAMIN WITH MINERALS) tablet Take 1 tablet by mouth daily.   Yes [provider]  niacin  (NIASPAN ) 1000 MG CR tablet TAKE 2 TABLETS AT BEDTIME 11/16/23  Yes Avanell Leigh, MD  omeprazole  (PRILOSEC) 20 MG capsule Take 1 capsule (20 mg total) by mouth daily. 07/11/23  Yes Avanell Leigh, MD  pravastatin  (PRAVACHOL ) 40 MG tablet TAKE 1 TABLET EVERY DAY (OBTAIN FURTHER REFILLS FROM PRIMARY CARE PROVIDER) 10/24/23  Yes Watson Hacking, MD  promethazine -dextromethorphan (PROMETHAZINE -DM) 6.25-15 MG/5ML syrup Take 5 mLs by mouth 4 (four) times daily as needed for cough. 10/20/23  Yes Tysinger, Christiane Cowing, PA-C  vitamin E 400 UNIT capsule Take 400 Units by mouth daily.   Yes [provider]    Family History Family History  Problem Relation Age of Onset   Kidney disease Mother    Heart disease Father    Hypertension Brother     Social History Social History   Tobacco Use   Smoking status: Former    Current packs/day: 0.12    Average packs/day: 0.1 packs/day for 40.0 years (4.8 ttl pk-yrs)    Types: Cigarettes   Smokeless tobacco: Never   Tobacco comments:    07/04/2013 "I quit smoking before 2000"  Vaping Use   Vaping status: Never Used  Substance Use Topics   Alcohol use: No   Drug use: No     Allergies   Peanut-containing drug products   Review of Systems Review of Systems  Respiratory:  Positive for cough.      Physical Exam Triage Vital Signs ED Triage Vitals  Encounter Vitals Group     BP 02/05/24 1037 119/86     Systolic BP Percentile --      Diastolic BP Percentile --      Pulse Rate  02/05/24 1037 (!) 107     Resp 02/05/24 1037 16     Temp 02/05/24 1037 98 F (36.7 C)     Temp Source 02/05/24 1037 Oral     SpO2 02/05/24 1037 96 %     Weight 02/05/24 1036 157 lb (71.2 kg)     Height 02/05/24 1035 5\' 7"  (1.702 m)     Head Circumference --      Peak Flow --      Pain Score 02/05/24 1035 0     Pain Loc --      Pain Education --      Exclude from Growth Chart --    No data found.  Updated Vital Signs BP 119/86 (BP Location: Right Arm)   Pulse (!) 107   Temp 98  F (36.7 C) (Oral)   Resp 16   Ht 5\' 7"  (1.702 m)   Wt 157 lb (71.2 kg)   SpO2 96%   BMI 24.59 kg/m   Visual Acuity Right Eye Distance:   Left Eye Distance:   Bilateral Distance:    Right Eye Near:   Left Eye Near:    Bilateral Near:     Physical Exam Constitutional:      General: He is not in acute distress.    Appearance: Normal appearance. He is not ill-appearing.  HENT:     Nose: No congestion or rhinorrhea.     Right Sinus: No maxillary sinus tenderness or frontal sinus tenderness.     Left Sinus: No maxillary sinus tenderness or frontal sinus tenderness.     Mouth/Throat:     Mouth: Mucous membranes are moist.     Pharynx: Oropharynx is clear. No oropharyngeal exudate or posterior oropharyngeal erythema.  Cardiovascular:     Rate and Rhythm: Normal rate and regular rhythm.     Comments: I do not hear afib or feel an irregular pulse today Pulmonary:     Effort: Pulmonary effort is normal.     Breath sounds: Normal breath sounds.     Comments: Frequent wet sounding cough Neurological:     Mental Status: He is alert.      UC Treatments / Results  Labs (all labs ordered are listed, but only abnormal results are displayed) Labs Reviewed - No data to display  EKG   Radiology No results found.  Procedures Procedures (including critical care time)  Medications Ordered in UC Medications - No data to display  Initial Impression / Assessment and Plan / UC Course  I have  reviewed the triage vital signs and the nursing notes.  Pertinent labs & imaging results that were available during my care of the patient were reviewed by me and considered in my medical decision making (see chart for details).    Does not appear acutely ill or in respiratory distress. Only sx is cough with purulent sputum. Has HIV that labwork from January shows is controlled. Discussed option of getting CXR or treating empirically for cap with antibiotics; pt and wife choose empiric antibiotics. Discussed supportive care.   Final Clinical Impressions(s) / UC Diagnoses   Final diagnoses:  Lower respiratory infection     Discharge Instructions      Robitussin is immediate release guaifenesin and Mucinex is extended release guaifenesin. It's ok to take either but not both and generic forms are ok to use. I prefer mucinex or its generic because you only have to take it twice a day.   The "DM" part of cold medicines is dextromethorphan, a cough suppressant. If you need a cough medicine, combo Mucinex or Robitussin with DM will work.   Drink lots of liquids to stay hydrated and help thiin the mucus out.   Ok to be active and take deep breaths.    ED Prescriptions     Medication Sig Dispense Auth. Provider   azithromycin  (ZITHROMAX ) 250 MG tablet Take 1 tablet (250 mg total) by mouth daily. Take first 2 tablets together, then 1 every day until finished. 6 tablet Blinda Burger, NP   amoxicillin -clavulanate (AUGMENTIN ) 875-125 MG tablet Take 1 tablet by mouth 2 (two) times daily. 14 tablet Blinda Burger, NP      PDMP not reviewed this encounter.   Blinda Burger, NP 02/05/24 1115

## 2024-02-05 NOTE — Discharge Instructions (Addendum)
 Robitussin is immediate release guaifenesin and Mucinex is extended release guaifenesin. It's ok to take either but not both and generic forms are ok to use. I prefer mucinex or its generic because you only have to take it twice a day.   The "DM" part of cold medicines is dextromethorphan, a cough suppressant. If you need a cough medicine, combo Mucinex or Robitussin with DM will work.   Drink lots of liquids to stay hydrated and help thiin the mucus out.   Ok to be active and take deep breaths.

## 2024-02-07 ENCOUNTER — Other Ambulatory Visit (HOSPITAL_COMMUNITY): Payer: Self-pay

## 2024-02-07 ENCOUNTER — Other Ambulatory Visit: Payer: Self-pay

## 2024-02-07 NOTE — Progress Notes (Signed)
 Specialty Pharmacy Refill Coordination Note  Jeremy Reeves is a 83 y.o. male contacted today regarding refills of specialty medication(s) Dolutegravir -lamiVUDine  (Dovato )   Patient requested Delivery   Delivery date: 02/22/24   Verified address: [address] [place] [address]   Medication will be filled on 02/21/24.

## 2024-02-10 ENCOUNTER — Telehealth: Payer: Self-pay | Admitting: Podiatry

## 2024-02-10 NOTE — Telephone Encounter (Signed)
 LVM cancel DSM appt; no new appts due to staffing

## 2024-02-20 ENCOUNTER — Ambulatory Visit (INDEPENDENT_AMBULATORY_CARE_PROVIDER_SITE_OTHER): Payer: MEDICARE | Admitting: Podiatry

## 2024-02-20 ENCOUNTER — Other Ambulatory Visit: Payer: MEDICARE

## 2024-02-20 DIAGNOSIS — M79675 Pain in left toe(s): Secondary | ICD-10-CM

## 2024-02-20 DIAGNOSIS — B351 Tinea unguium: Secondary | ICD-10-CM | POA: Diagnosis not present

## 2024-02-20 DIAGNOSIS — M79674 Pain in right toe(s): Secondary | ICD-10-CM

## 2024-02-20 NOTE — Progress Notes (Signed)
     Chief Complaint  Patient presents with   [facility]    DFC, NIDDM A1C?   HPI: 83 y.o. male presents today with several concerns.  He is requesting diabetic nail care.  States the toenails are painful in shoe gear when ambulating.  Past Medical History:  Diagnosis Date   ASHD (arteriosclerotic heart disease)    BPH (benign prostatic hyperplasia)    Difficult intubation    anesthesia record 1999 / record on chart   Foley catheter in place 10/23/12   GERD (gastroesophageal reflux disease)    HH (hiatus hernia)    Hiatal hernia    HIV (human immunodeficiency virus infection) (HCC) dx'd 2014   Hyperlipidemia    Hypertension    Shingles 8/14   Type II diabetes mellitus (HCC)    pre diabetes    Past Surgical History:  Procedure Laterality Date   CARDIAC CATHETERIZATION  05/02/1998   Recommend CABG   CARDIOVASCULAR STRESS TEST  12/01/2011   Perfusion defect consistent with diaphragmatic attenuation. There is no scintigraphic of inducible myocardial ischemia.    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 LIMA to the LAD, vein to obtuse marginal, and vein to RCA   IRRIGATION AND DEBRIDEMENT ABSCESS Right 12/04/2015   Procedure: MINOR INCISION AND DRAINAGE OF ABSCESS;  Surgeon: Brunilda Capra, MD;  Location: [facility];  Service: Orthopedics;  Laterality: Right;   TRANSTHORACIC ECHOCARDIOGRAM  03/29/2011   EF ~50%, mild-moderate inferior wall hypokinesis   TRANSURETHRAL RESECTION OF PROSTATE N/A 10/25/2013   Procedure: TRANSURETHRAL RESECTION OF THE PROSTATE WITH GYRUS BUTTON;  Surgeon: Homero Luster, MD;  Location: [facility];  Service: Urology;  Laterality: N/A;    Allergies  Allergen Reactions   Peanut-Containing Drug Products Swelling    Physical Exam: General: The patient is alert and oriented x3 in no acute distress.  Dermatology: Skin is warm, dry and supple bilateral lower extremities.  The left first interspace is macerated without ulceration.  The  toenails x10 are 3 mm thick with yellow discoloration, subungual debris, distal onycholysis, and pain with compression.  They are all elongated.    Vascular: Palpable pedal pulses bilaterally. Capillary refill within normal limits.  No appreciable edema.  No erythema or calor.  Assessment/Plan of Care: 1. Pain due to onychomycosis of toenails of both feet     The mycotic toenails were sharply debrided with sterile nail nippers and a power debriding bur x 10 to decrease girth and length.  Continue with ketoconazole  2% cream - apply once daily to the left first interspace.   He will follow-up in 3 months  Jauna Raczynski D. Rasheedah Reis, DPM, FACFAS [facility]     [address] [place], [place] [address]                Office [phone]  Fax [phone]

## 2024-02-21 ENCOUNTER — Emergency Department (HOSPITAL_COMMUNITY): Payer: MEDICARE

## 2024-02-21 ENCOUNTER — Emergency Department (HOSPITAL_COMMUNITY)
Admission: EM | Admit: 2024-02-21 | Discharge: 2024-02-21 | Disposition: A | Discharge status: Home / Self Care | Payer: MEDICARE

## 2024-02-21 ENCOUNTER — Ambulatory Visit: Payer: Self-pay

## 2024-02-21 ENCOUNTER — Other Ambulatory Visit: Payer: Self-pay

## 2024-02-21 ENCOUNTER — Encounter (HOSPITAL_COMMUNITY): Payer: Self-pay | Admitting: Emergency Medicine

## 2024-02-21 DIAGNOSIS — Z951 Presence of aortocoronary bypass graft: Secondary | ICD-10-CM | POA: Diagnosis not present

## 2024-02-21 DIAGNOSIS — Z79899 Other long term (current) drug therapy: Secondary | ICD-10-CM | POA: Diagnosis not present

## 2024-02-21 DIAGNOSIS — Z91010 Allergy to peanuts: Secondary | ICD-10-CM | POA: Diagnosis not present

## 2024-02-21 DIAGNOSIS — Z7901 Long term (current) use of anticoagulants: Secondary | ICD-10-CM | POA: Insufficient documentation

## 2024-02-21 DIAGNOSIS — I10 Essential (primary) hypertension: Secondary | ICD-10-CM | POA: Insufficient documentation

## 2024-02-21 DIAGNOSIS — R0789 Other chest pain: Secondary | ICD-10-CM | POA: Diagnosis not present

## 2024-02-21 DIAGNOSIS — R079 Chest pain, unspecified: Secondary | ICD-10-CM | POA: Insufficient documentation

## 2024-02-21 LAB — BASIC METABOLIC PANEL WITH GFR
Anion gap: 11 (ref 5–15)
BUN: 16 mg/dL (ref 8–23)
CO2: 26 mmol/L (ref 22–32)
Calcium: 8.9 mg/dL (ref 8.9–10.3)
Chloride: 104 mmol/L (ref 98–111)
Creatinine, Ser: 1.51 mg/dL — ABNORMAL HIGH (ref 0.61–1.24)
GFR, Estimated: 46 mL/min — ABNORMAL LOW (ref >60)
Glucose, Bld: 94 mg/dL (ref 70–99)
Potassium: 4 mmol/L (ref 3.5–5.1)
Sodium: 141 mmol/L (ref 135–145)

## 2024-02-21 LAB — CBC
HCT: 47.1 % (ref 39.0–52.0)
Hemoglobin: 15.5 g/dL (ref 13.0–17.0)
MCH: 29.4 pg (ref 26.0–34.0)
MCHC: 32.9 g/dL (ref 30.0–36.0)
MCV: 89.4 fL (ref 80.0–100.0)
Platelets: 92 10*3/uL — ABNORMAL LOW (ref 150–400)
RBC: 5.27 MIL/uL (ref 4.22–5.81)
RDW: 14 % (ref 11.5–15.5)
WBC: 4.8 10*3/uL (ref 4.0–10.5)
nRBC: 0 % (ref 0.0–0.2)

## 2024-02-21 LAB — TROPONIN I (HIGH SENSITIVITY)
Troponin I (High Sensitivity): 17 ng/L (ref <18)
Troponin I (High Sensitivity): 18 ng/L — ABNORMAL HIGH (ref <18)

## 2024-02-21 NOTE — Telephone Encounter (Signed)
 Pt is at ER

## 2024-02-21 NOTE — Telephone Encounter (Signed)
 Left message for pt or pt's wife to call back

## 2024-02-21 NOTE — Telephone Encounter (Signed)
 Chief Complaint: chest pain Symptoms: chest pain Frequency: x 2 days Pertinent Negatives: Patient denies sob Disposition: [x] ED /[] Urgent Care (no appt availability in office) / [] Appointment(In office/virtual)/ []  [facility]/ [] Home Care/ [] Refused Recommended Disposition /[] [facility]/ []  Follow-up with PCP  Additional Notes: Pt wife states that he was seen in UC on 02/05/24 for a cough and congestion. States he is currently having pain in his chest in the center of chest about half way down. Pt states that pain is a sharp pain.  States that not currently feeling the pain now but was happening when she first called.  Copied from CRM [id number]. Topic: Clinical - Red Word Triage >> Feb 21, 2024  9:14 AM Zipporah Him wrote: Red Word that prompted transfer to Nurse Triage: Pains in the chest, started two days ago, still happening today. Right in the middle under collar bone. Reason for Disposition  [1] Chest pain (or "angina") comes and goes AND [2] is happening more often (increasing in frequency) or getting worse (increasing in severity)  (Exception: Chest pains that last only a few seconds.)  Answer Assessment - Initial Assessment Questions 1. LOCATION: "Where does it hurt?"       Center of chest 2. RADIATION: "Does the pain go anywhere else?" (e.g., into neck, jaw, arms, back)     no 3. ONSET: "When did the chest pain begin?" (Minutes, hours or days)      About comes 4. PATTERN: "Does the pain come and go, or has it been constant since it started?"  "Does it get worse with exertion?"      Comes and goes 5. DURATION: "How long does it last" (e.g., seconds, minutes, hours)     A few minutes 6. SEVERITY: "How bad is the pain?"  (e.g., Scale 1-10; mild, moderate, or severe)    - MILD (1-3): doesn't interfere with normal activities     - MODERATE (4-7): interferes with normal activities or awakens from sleep    - SEVERE (8-10): excruciating pain, unable to do any normal  activities        Mod 7. CARDIAC RISK FACTORS: "Do you have any history of heart problems or risk factors for heart disease?" (e.g., angina, prior heart attack; diabetes, high blood pressure, high cholesterol, smoker, or strong family history of heart disease)     Triple bypass 8. PULMONARY RISK FACTORS: "Do you have any history of lung disease?"  (e.g., blood clots in lung, asthma, emphysema, birth control pills)     no 9. CAUSE: "What do you think is causing the chest pain?"     unknown 10. OTHER SYMPTOMS: "Do you have any other symptoms?" (e.g., dizziness, nausea, vomiting, sweating, fever, difficulty breathing, cough)       no  Protocols used: Chest Pain-A-AH

## 2024-02-21 NOTE — ED Triage Notes (Signed)
 Pt reports central CP for 2 days. States he was seen last week at [facility] for cough which has improved. Wife reports decreased appetite. Denies SOB, nausea, back pain, lightheadedness. Denies CP at this time.

## 2024-02-21 NOTE — Discharge Instructions (Signed)
 Please follow-up with your primary doctor and your cardiologist.  Return immediately for fevers, chills, chest pain returns, shortness of breath, lightheadedness, feel your heart is racing, you pass out or any new or worsening symptoms that are concerning to you.

## 2024-02-21 NOTE — ED Provider Notes (Signed)
 [place] EMERGENCY DEPARTMENT AT [facility] Provider Note   CSN: [id number] Arrival date & time: 02/21/24  1042     History  Chief Complaint  Patient presents with   Chest Pain    Jeremy Reeves is a 83 y.o. male.  83 year old male presenting emergency department for chest pain.  Had some chest discomfort after he drinks a glass of water that lasted a few seconds.  It has since resolved.  Not currently having chest pain.  No shortness of breath.  No palpitations no nausea or vomiting.  Does have nonproductive cough.  No fevers or chills.   Chest Pain      Home Medications Prior to Admission medications   Medication Sig Start Date End Date Taking? Authorizing Provider  carvedilol  (COREG ) 6.25 MG tablet TAKE 1 TABLET TWICE DAILY WITH MEALS 12/21/23  Yes Lalonde, Jamaury C, MD  Cinnamon 500 MG capsule Take 1,000 mg by mouth daily.   Yes [provider]  Cranberry 360 MG CAPS Take by mouth.   Yes [provider]  Dextromethorphan-guaiFENesin (MUCINEX FAST-MAX DM MAX) 5-100 MG/5ML LIQD Take 20 mLs by mouth 2 (two) times daily as needed (Cough).   Yes [provider]  dolutegravir -lamiVUDine  (DOVATO ) 50-300 MG tablet Take 1 tablet by mouth daily. 11/02/23  Yes Liane Redman, MD  ELIQUIS  2.5 MG TABS tablet TAKE 1 TABLET TWICE DAILY 10/03/23  Yes Avanell Leigh, MD  ketoconazole  (NIZORAL ) 2 % cream Apply 1 Application topically daily. Apply 1gm between affected toes once daily 11/21/23  Yes McCaughan, Dia D, DPM  mirabegron  ER (MYRBETRIQ ) 25 MG TB24 tablet Take 1 tablet (25 mg total) by mouth daily. 09/06/22  Yes Watson Hacking, MD  Multiple Vitamins-Minerals (MULTIVITAMIN WITH MINERALS) tablet Take 1 tablet by mouth daily.   Yes [provider]  niacin  (NIASPAN ) 1000 MG CR tablet TAKE 2 TABLETS AT BEDTIME 11/16/23  Yes Avanell Leigh, MD  omeprazole  (PRILOSEC) 20 MG capsule Take 1 capsule (20 mg total) by mouth daily. 07/11/23  Yes  Avanell Leigh, MD  pravastatin  (PRAVACHOL ) 40 MG tablet TAKE 1 TABLET EVERY DAY (OBTAIN FURTHER REFILLS FROM PRIMARY CARE PROVIDER) 10/24/23  Yes Watson Hacking, MD  vitamin E 400 UNIT capsule Take 400 Units by mouth daily.   Yes [provider]  amoxicillin -clavulanate (AUGMENTIN ) 875-125 MG tablet Take 1 tablet by mouth 2 (two) times daily. Patient not taking: Reported on 02/21/2024 02/05/24   Blinda Burger, NP  azithromycin  (ZITHROMAX ) 250 MG tablet Take 1 tablet (250 mg total) by mouth daily. Take first 2 tablets together, then 1 every day until finished. Patient not taking: Reported on 02/21/2024 02/05/24   Blinda Burger, NP      Allergies    Peanut-containing drug products    Review of Systems   Review of Systems  Cardiovascular:  Positive for chest pain.    Physical Exam Updated Vital Signs BP (!) 140/73   Pulse 76   Temp (!) 97.4 F (36.3 C) (Oral)   Resp 20   Ht 5\' 7"  (1.702 m)   Wt 71 kg   SpO2 100%   BMI 24.52 kg/m  Physical Exam Vitals and nursing note reviewed.  Constitutional:      General: He is not in acute distress.    Appearance: He is not toxic-appearing.  HENT:     Head: Normocephalic.  Cardiovascular:     Rate and Rhythm: Normal rate and regular rhythm.  Heart sounds: Normal heart sounds.  Pulmonary:     Effort: Pulmonary effort is normal.     Breath sounds: Normal breath sounds.  Musculoskeletal:     Right lower leg: No edema.     Left lower leg: No edema.  Skin:    General: Skin is warm.     Capillary Refill: Capillary refill takes less than 2 seconds.  Neurological:     Mental Status: He is alert and oriented to person, place, and time.     ED Results / Procedures / Treatments   Labs (all labs ordered are listed, but only abnormal results are displayed) Labs Reviewed  BASIC METABOLIC PANEL WITH GFR - Abnormal; Notable for the following components:      Result Value   Creatinine, Ser 1.51 (*)    GFR, Estimated 46 (*)     All other components within normal limits  CBC - Abnormal; Notable for the following components:   Platelets 92 (*)    All other components within normal limits  TROPONIN I (HIGH SENSITIVITY) - Abnormal; Notable for the following components:   Troponin I (High Sensitivity) 18 (*)    All other components within normal limits  TROPONIN I (HIGH SENSITIVITY)    EKG EKG Interpretation Date/Time:  Tuesday Feb 21 2024 10:47:13 EDT Ventricular Rate:  85 PR Interval:  194 QRS Duration:  142 QT Interval:  400 QTC Calculation: 476 R Axis:   -84  Text Interpretation: Undetermined rhythm Left axis deviation Right bundle branch block Inferior infarct , age undetermined Abnormal ECG When compared with ECG of 28-Jun-2023 13:57, PREVIOUS ECG IS PRESENT Confirmed by Elise Guile [phone]) on 02/21/2024 12:33:45 PM  Radiology DG Chest 2 View Result Date: 02/21/2024 CLINICAL DATA:  Chest pain. EXAM: CHEST - 2 VIEW COMPARISON:  10/23/2013. FINDINGS: Bilateral lung fields are clear. Bilateral costophrenic angles are clear. Stable cardio-mediastinal silhouette. There are surgical staples along the heart border and sternotomy wires, status post CABG (coronary artery bypass graft). No acute osseous abnormalities. The soft tissues are within normal limits. IMPRESSION: *No active cardiopulmonary disease. Electronically Signed   By: Beula Brunswick M.D.   On: 02/21/2024 11:36    Procedures Procedures    Medications Ordered in ED Medications - No data to display  ED Course/ Medical Decision Making/ A&P                                 Medical Decision Making 83 year old male presenting emergency department for chest pain.  Afebrile nontachycardic, slightly hypertensive maintaining oxygen saturation on room air.  He is asymptomatic currently.  His symptoms atypical and likely related to GI source as it happened shortly after drinking glass of water.  EKG appears similar to prior with no ST segment changes evident  acute ischemia.  Troponin not elevated and repeat flat.  Low risk for PE based on Wells criteria.  Chest x-ray without pneumonia.  No leukocytosis to suggest infectious process.  No anemia.  No significant metabolic derangements.  He is at his baseline kidney function per review of old records.  Does have a cardiologist, but given atypical presentation with 2 negative troponins that he is appropriate for outpatient follow-up.  Amount and/or Complexity of Data Reviewed Independent Historian: spouse    Details: Notes he was treated recently for pneumonia External Data Reviewed:     Details: Appears last tress test was in 2013 Labs: ordered. Decision-making details documented  in ED Course. Radiology: ordered and independent interpretation performed. ECG/medicine tests: independent interpretation performed.  Risk Decision regarding hospitalization.         Final Clinical Impression(s) / ED Diagnoses Final diagnoses:  None    Rx / DC Orders ED Discharge Orders     None         Rolinda Climes, DO 02/21/24 1445

## 2024-02-22 ENCOUNTER — Ambulatory Visit (INDEPENDENT_AMBULATORY_CARE_PROVIDER_SITE_OTHER): Payer: MEDICARE

## 2024-02-23 ENCOUNTER — Other Ambulatory Visit: Payer: Self-pay

## 2024-02-29 ENCOUNTER — Ambulatory Visit: Payer: MEDICARE | Admitting: Internal Medicine

## 2024-03-07 ENCOUNTER — Other Ambulatory Visit: Payer: Self-pay

## 2024-03-07 ENCOUNTER — Ambulatory Visit (INDEPENDENT_AMBULATORY_CARE_PROVIDER_SITE_OTHER): Payer: MEDICARE | Admitting: Internal Medicine

## 2024-03-07 VITALS — BP 131/85 | HR 91 | Temp 97.3°F | Wt 149.0 lb

## 2024-03-07 DIAGNOSIS — R1312 Dysphagia, oropharyngeal phase: Secondary | ICD-10-CM | POA: Diagnosis not present

## 2024-03-07 DIAGNOSIS — R4189 Other symptoms and signs involving cognitive functions and awareness: Secondary | ICD-10-CM

## 2024-03-07 DIAGNOSIS — Z79899 Other long term (current) drug therapy: Secondary | ICD-10-CM

## 2024-03-07 DIAGNOSIS — B20 Human immunodeficiency virus [HIV] disease: Secondary | ICD-10-CM | POA: Diagnosis not present

## 2024-03-07 DIAGNOSIS — N183 Chronic kidney disease, stage 3 unspecified: Secondary | ICD-10-CM

## 2024-03-07 NOTE — Progress Notes (Unsigned)
 RFV: follow up for hiv diseae  Patient ID: Jeremy Reeves, male   DOB: 11-25-1940, 83 y.o.   MRN: [id number]  HPI  Jeremy Reeves is an 83yo M with HTN, GERd, HIV that is well controlled. Worsening cognitivie impairment since we last saw him. Slow decline and now has some swallowing difficulty. having difficulty with swallowing -worsening in the last 6 months Unintentional weight loss due to more difficulty with swallowing food. His PCP is also retiring. He is here with his wife who is informing me of latest health issues.  Family hx: Hx of sister with dementia  Outpatient Encounter Medications as of 03/07/2024  Medication Sig   carvedilol  (COREG ) 6.25 MG tablet TAKE 1 TABLET TWICE DAILY WITH MEALS   Cinnamon 500 MG capsule Take 1,000 mg by mouth daily.   Cranberry 360 MG CAPS Take by mouth.   Dextromethorphan-guaiFENesin (MUCINEX FAST-MAX DM MAX) 5-100 MG/5ML LIQD Take 20 mLs by mouth 2 (two) times daily as needed (Cough).   dolutegravir -lamiVUDine  (DOVATO ) 50-300 MG tablet Take 1 tablet by mouth daily.   ELIQUIS  2.5 MG TABS tablet TAKE 1 TABLET TWICE DAILY   ketoconazole  (NIZORAL ) 2 % cream Apply 1 Application topically daily. Apply 1gm between affected toes once daily   mirabegron  ER (MYRBETRIQ ) 25 MG TB24 tablet Take 1 tablet (25 mg total) by mouth daily.   Multiple Vitamins-Minerals (MULTIVITAMIN WITH MINERALS) tablet Take 1 tablet by mouth daily.   niacin  (NIASPAN ) 1000 MG CR tablet TAKE 2 TABLETS AT BEDTIME   omeprazole  (PRILOSEC) 20 MG capsule Take 1 capsule (20 mg total) by mouth daily.   pravastatin  (PRAVACHOL ) 40 MG tablet TAKE 1 TABLET EVERY DAY (OBTAIN FURTHER REFILLS FROM PRIMARY CARE PROVIDER)   vitamin E 400 UNIT capsule Take 400 Units by mouth daily.   amoxicillin -clavulanate (AUGMENTIN ) 875-125 MG tablet Take 1 tablet by mouth 2 (two) times daily. (Patient not taking: Reported on 02/21/2024)   azithromycin  (ZITHROMAX ) 250 MG tablet Take 1 tablet (250 mg total) by mouth daily.  Take first 2 tablets together, then 1 every day until finished. (Patient not taking: Reported on 02/21/2024)   No facility-administered encounter medications on file as of 03/07/2024.     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 He appears well-developed and well-nourished. No distress.  HENT:  Mouth/Throat:  Oropharynx is clear and moist. No oropharyngeal exudate.  Cardiovascular: Normal rate, regular rhythm and normal heart sounds. Exam reveals no gallop and no friction rub.  No murmur heard.  Pulmonary/Chest: Effort normal and breath sounds normal. No respiratory distress. He has no wheezes.  Abdominal: Soft. Bowel sounds are normal. He exhibits no distension. There is no tenderness.  Lymphadenopathy:  He has no cervical adenopathy.  Neurological: He is alert and oriented to person,  place, and time.  Skin: Skin is warm and dry. No rash noted. No erythema.  Psychiatric: He has a normal mood and affect. His behavior is normal.   Lab Results  Component Value Date   CD4TCELL 46 11/02/2023   Lab Results  Component Value Date   CD4TABS 437 03/17/2023   CD4TABS 513 02/22/2022   CD4TABS 459 03/23/2021   Lab Results  Component Value Date   HIV1RNAQUANT Not Detected 11/02/2023   Lab Results  Component Value Date   HEPBSAB NEG 05/24/2013   Lab Results  Component Value Date   LABRPR NON-REACTIVE 03/17/2023    CBC Lab Results  Component Value Date   WBC 4.8 02/21/2024   RBC 5.27 02/21/2024   HGB 15.5 02/21/2024   HCT 47.1 02/21/2024   PLT 92 (L) 02/21/2024   MCV 89.4 02/21/2024   MCH 29.4 02/21/2024   MCHC 32.9 02/21/2024   RDW 14.0 02/21/2024   LYMPHSABS 1,268 03/17/2023   MONOABS 495 04/05/2017   EOSABS 119 11/02/2023    BMET Lab Results  Component Value Date   NA 141 02/21/2024   K 4.0 02/21/2024   CL 104 02/21/2024   CO2 26 02/21/2024   GLUCOSE 94 02/21/2024   BUN 16 02/21/2024   CREATININE 1.51 (H) 02/21/2024   CALCIUM  8.9 02/21/2024   GFRNONAA 46 (L) 02/21/2024   GFRAA 47 (L) 03/23/2021   Lab Results  Component Value Date   CD4TCELL 41 03/07/2024   CD4TABS 437 03/17/2023      Assessment and Plan Dysphagia =  unlikely to be eso candidiasis associated to hiv disease since CD 4 count > 200. We will start with Barium swallow and FESS to figure out swallowing issue.also refer to GI.  HIV disease =Will get hiv lab. Continue with dovato . If having more difficulty with medicaiton, then switchto tivicay  and descovy to crush  Long term medication management = will check cr   Cognitive impairment = likely multifactorial. Can see with hiv disease such as HAND- we will check brain mri. Then refer to neurology for remaining work up.

## 2024-03-09 LAB — T-HELPER CELLS (CD4) COUNT (NOT AT ARMC)
Absolute CD4: 587 {cells}/uL (ref 490–1740)
CD4 T Helper %: 41 % (ref 30–61)
Total lymphocyte count: 1418 {cells}/uL (ref 850–3900)

## 2024-03-09 LAB — HIV-1 RNA QUANT-NO REFLEX-BLD
HIV 1 RNA Quant: NOT DETECTED {copies}/mL
HIV-1 RNA Quant, Log: NOT DETECTED {Log_copies}/mL

## 2024-03-13 ENCOUNTER — Other Ambulatory Visit: Payer: Self-pay

## 2024-03-13 NOTE — Progress Notes (Signed)
 Specialty Pharmacy Refill Coordination Note  Jeremy Reeves is a 83 y.o. male contacted today regarding refills of specialty medication(s) Dolutegravir -lamiVUDine  (Dovato )   Patient requested Delivery   Delivery date: 03/21/24   Verified address: [address] [place] [address]   Medication will be filled on 03/20/24.

## 2024-03-14 ENCOUNTER — Ambulatory Visit (HOSPITAL_COMMUNITY)
Admission: RE | Admit: 2024-03-14 | Discharge: 2024-03-14 | Disposition: A | Discharge status: Home / Self Care | Payer: MEDICARE | Source: Ambulatory Visit | Attending: Internal Medicine | Admitting: Internal Medicine

## 2024-03-14 DIAGNOSIS — R4189 Other symptoms and signs involving cognitive functions and awareness: Secondary | ICD-10-CM | POA: Diagnosis not present

## 2024-03-14 MED ORDER — GADOBUTROL 1 MMOL/ML IV SOLN
7.0000 mL | Freq: Once | INTRAVENOUS | Status: AC | PRN
  Administered 2024-03-14: 7 mL via INTRAVENOUS

## 2024-03-15 ENCOUNTER — Ambulatory Visit (INDEPENDENT_AMBULATORY_CARE_PROVIDER_SITE_OTHER): Payer: MEDICARE | Admitting: Family Medicine

## 2024-03-15 VITALS — BP 104/72 | HR 84 | Wt 145.0 lb

## 2024-03-15 DIAGNOSIS — B20 Human immunodeficiency virus [HIV] disease: Secondary | ICD-10-CM | POA: Diagnosis not present

## 2024-03-15 DIAGNOSIS — L989 Disorder of the skin and subcutaneous tissue, unspecified: Secondary | ICD-10-CM

## 2024-03-15 NOTE — Progress Notes (Signed)
   Subjective:    Patient ID: Jeremy Reeves, male    DOB: July 20, 1941, 83 y.o.   MRN: [id number]  HPI He is here for follow-up visit.  He was seen in the emergency room May 6 for evaluation of chest pain.  The emergency room record was evaluated and nothing was found other than he was supposed to follow-up here.  Since then he was seen by Dr. Artemio Larry on May 21.  Apparently there was issues with swallowing as well as cognitive impairment and according to the wife an MRI is pending.  He was seen in November of last year for evaluation of a submandibular mass that turned out to be benign and was recommended follow-up in 6 months.  Presently he is having no chest pain, shortness of breath, fever, chills.   Review of Systems     Objective:    Physical Exam Alert and in no distress. Tympanic membranes and canals are normal. Pharyngeal area is normal. Neck is supple without adenopathy or thyromegaly. Cardiac exam shows a regular sinus rhythm without murmurs or gallops. Lungs are clear to auscultation.        Assessment & Plan:  HIV disease (HCC)  Lesion of neck Presently he is having no symptoms and they are waiting to hear about the MRI.  Today's neck exam is negative.  At this point we are just watchful waiting mode with no interventions at the present time.

## 2024-03-26 ENCOUNTER — Encounter: Payer: Self-pay | Admitting: Physician Assistant

## 2024-04-03 ENCOUNTER — Telehealth: Payer: Self-pay

## 2024-04-03 NOTE — Telephone Encounter (Signed)
 Patient's wife called wanting to know if provider had been able to review MRI results from May.   Patient's next follow up here is 8/25 and is scheduled to see the neurologist 9/8.  Teniqua Marron, BSN, RN

## 2024-04-08 ENCOUNTER — Other Ambulatory Visit: Payer: Self-pay | Admitting: Family Medicine

## 2024-04-08 DIAGNOSIS — E1169 Type 2 diabetes mellitus with other specified complication: Secondary | ICD-10-CM

## 2024-04-11 ENCOUNTER — Ambulatory Visit: Payer: MEDICARE | Admitting: Nurse Practitioner

## 2024-04-11 ENCOUNTER — Encounter: Payer: Self-pay | Admitting: Nurse Practitioner

## 2024-04-11 ENCOUNTER — Telehealth: Payer: Self-pay | Admitting: Cardiovascular Disease

## 2024-04-11 VITALS — BP 87/63 | HR 90 | Ht 67.0 in | Wt 136.6 lb

## 2024-04-11 DIAGNOSIS — R634 Abnormal weight loss: Secondary | ICD-10-CM

## 2024-04-11 DIAGNOSIS — K219 Gastro-esophageal reflux disease without esophagitis: Secondary | ICD-10-CM

## 2024-04-11 DIAGNOSIS — R1312 Dysphagia, oropharyngeal phase: Secondary | ICD-10-CM

## 2024-04-11 NOTE — Telephone Encounter (Signed)
 Spoke with wife per DPR.  She states while at gastro today his BP was low 87/63. She has not given him any medicine because he has been having a hard time swollowing.  HR currently 92/70 HR 100. No chest pain, SOB, swelling, headache, nausea or vomiting.  Advised to try elevating his legs. Encouraging patient to eat and drink fluids. She will continue to monitor BP/HR.   Spoke with DOD Dr. Anner and he agrees to plan.   Spoke with wife again and she verbalized understanding of recommendations.  ED precautions discussed

## 2024-04-11 NOTE — Telephone Encounter (Signed)
 Pt c/o BP issue: STAT if pt c/o blurred vision, one-sided weakness or slurred speech.  STAT if BP is GREATER than 180/120 TODAY.  STAT if BP is LESS than 90/60 and SYMPTOMATIC TODAY  1. What is your BP concern? Pt spouse states GI told them to call and report low BP today, not symptomatic she states.   2. Have you taken any BP medication today? No   3. What are your last 5 BP readings?  87/63 - gastro appt today   4. Are you having any other symptoms (ex. Dizziness, headache, blurred vision, passed out)? No

## 2024-04-11 NOTE — Patient Instructions (Signed)
 _______________________________________________________  If your blood pressure at your visit was 140/90 or greater, please contact your primary care physician to follow up on this.  _______________________________________________________  If you are age 82 or older, your body mass index should be between 23-30. Your Body mass index is 21.39 kg/m. If this is out of the aforementioned range listed, please consider follow up with your Primary Care Provider.  If you are age 82 or younger, your body mass index should be between 19-25. Your Body mass index is 21.39 kg/m. If this is out of the aformentioned range listed, please consider follow up with your Primary Care Provider.   ________________________________________________________  The [facility] providers would like to encourage you to use MYCHART to communicate with providers for non-urgent requests or questions.  Due to long hold times on the telephone, sending your provider a message by [facility] may be a faster and more efficient way to get a response.  Please allow 48 business hours for a response.  Please remember that this is for non-urgent requests.  _______________________________________________________  Please purchase the following medications over the counter and take as directed: Glucerna 2 cans daily   You have been scheduled for a Barium Esophogram at [facility] on 04-13-24 at 1pm. Please arrive 30 minutes prior to your appointment for registration. Make certain not to have anything to eat or drink 3 hours prior to your test. If you need to reschedule for any reason, please contact radiology at [phone] to do so. __________________________________________________________________ A barium swallow is an examination that concentrates on views of the esophagus. This tends to be a double contrast exam (barium and two liquids which, when combined, create a gas to distend the wall of the oesophagus) or single  contrast (non-ionic iodine based). The study is usually tailored to your symptoms so a good history is essential. Attention is paid during the study to the form, structure and configuration of the esophagus, looking for functional disorders (such as aspiration, dysphagia, achalasia, motility and reflux) EXAMINATION You may be asked to change into a gown, depending on the type of swallow being performed. A radiologist and radiographer will perform the procedure. The radiologist will advise you of the type of contrast selected for your procedure and direct you during the exam. You will be asked to stand, sit or lie in several different positions and to hold a small amount of fluid in your mouth before being asked to swallow while the imaging is performed .In some instances you may be asked to swallow barium coated marshmallows to assess the motility of a solid food bolus. The exam can be recorded as a digital or video fluoroscopy procedure. POST PROCEDURE It will take 1-2 days for the barium to pass through your system. To facilitate this, it is important, unless otherwise directed, to increase your fluids for the next 24-48hrs and to resume your normal diet.  This test typically takes about 30 minutes to perform. __________________________________________________________________________________  Thank you for trusting me with your gastrointestinal care!   Elida Shawl, CRNP

## 2024-04-11 NOTE — Progress Notes (Signed)
 04/11/2024 Jeremy Reeves [id number] 05/24/1941   CHIEF COMPLAINT: Dysphagia  HISTORY OF PRESENT ILLNESS: Jeremy Reeves is an 83 year old male with a past medical history of hypertension, hyperlipidemia, ischemic coronary artery s/p 3 vessel CABG 1999, atrial fibrillation on Eliquis , diabetes mellitus type 2, CKD stage IIIa, history of difficult intubation in 1999, HIV positive, cognitive impairment, BPH, thrombocytopenia, GERD and a rectal polyp. He presents to our office today as referred by Dr. Montie Bologna for further evaluation regarding dysphagia. He is accompanied by his wife. He started having difficulty swallowing around November 2024. He describes having difficulty initiating a swallow, takes more effort to swallow. He describes chewing food then has difficulty initiating a swallow but once he swallows, the food or liquid passes down the esophagus without any further difficulty. He denies having any regurgitation. No heartburn. No nausea or vomiting. No abdominal pain. No bloody or black stools. He is on Omeprazole  20 mg daily due to a prior history of GERD. He was seen by his infectious disease specialist Dr. Montie Cleverly and he discussed his swallowing difficulties at the time of his last appointment. A brain MRI 03/14/2024 showed moderate to advanced diffuse cerebral volume loss with mild chronic microvascular ischemic changes. A barium swallow study was ordered but has not yet been scheduled.  He denies ever having an EGD.  He underwent a colonoscopy 09/2011 which identified 1 polyp removed from the rectum, path report not available in Care Everywhere.  He denies having any further colonoscopies since then.  No known family history of esophageal, gastric or colon cancer.  He has lost 17 lbs over the past 5 to 6 months. In review of his EPIC records, he was seen ENT Dr. 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  He was seen in the ED 02/21/2024 due to to having chest pain which apparently occurred after he drank water. EKG without acute ischemia.  Troponin levels were flat.  Chest x-ray was unremarkable.  His clinical status was stable and there was no concern for ACS and he was discharged home.  He denies having further chest pain since then.     Latest Ref Rng & Units 02/21/2024   10:50 AM 11/02/2023   11:25 AM 03/17/2023    3:54 AM  CBC  WBC 4.0 - 10.5 K/uL 4.8  4.1  4.2   Hemoglobin 13.0 - 17.0 g/dL 84.4  83.9  83.8   Hematocrit 39.0 - 52.0 % 47.1  47.7  48.2   Platelets 150 - 400 K/uL 92  76  96        Latest Ref Rng & Units 02/21/2024   10:50 AM 11/02/2023   11:25 AM 03/17/2023    3:54 AM  CMP  Glucose 70 - 99 mg/dL 94  82  60   BUN 8 - 23 mg/dL 16  16  14    Creatinine 0.61 - 1.24 mg/dL 8.48  8.63  8.36   Sodium 135 - 145 mmol/L 141  143  143   Potassium 3.5 - 5.1 mmol/L 4.0  3.9  4.2   Chloride 98 - 111 mmol/L 104  102  107   CO2 22 - 32 mmol/L 26  31  27    Calcium  8.9 - 10.3 mg/dL 8.9  9.5  9.3   Total Protein  6.1 - 8.1 g/dL  7.3  7.2   Total Bilirubin 0.2 - 1.2 mg/dL  0.8  0.8   AST 10 - 35 U/L  25  28   ALT 9 - 46 U/L  21  23     RUQ sonogram 11/15/2023: FINDINGS: The liver demonstrates a normal echotexture without intrahepatic biliary dilatation. No masses are visualized. There is normal hepatopetal flow visualized within the main portal vein.   The gallbladder demonstrates normal anechoic echotexture without pericholecystic fluid or wall thickening. There are no gallstones. The common bile duct measures 0.2 cm. Negative sonographic Murphy's sign.   IMPRESSION: 1. Unremarkable ultrasound of the limited abdomen  PAST GI PROCEDURES:  Colonoscopy 09/23/2011: One 5 mm polyp in the rectum Internal hemorrhoids Examination was otherwise normal Blood in stool  Past  Medical History:  Diagnosis Date   ASHD (arteriosclerotic heart disease)    BPH (benign prostatic hyperplasia)    Difficult intubation    anesthesia record 1999 / record on chart   Foley catheter in place 10/23/12   GERD (gastroesophageal reflux disease)    HH (hiatus hernia)    Hiatal hernia    HIV (human immunodeficiency virus infection) (HCC) dx'd 2014   Hyperlipidemia    Hypertension    Shingles 8/14   Type II diabetes mellitus (HCC)    pre diabetes   Past Surgical History:  Procedure Laterality Date   CARDIAC CATHETERIZATION  05/02/1998   Recommend CABG   CARDIOVASCULAR STRESS TEST  12/01/2011   Perfusion defect consistent with diaphragmatic attenuation. There is no scintigraphic of inducible myocardial ischemia.    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 LIMA to the LAD, vein to obtuse marginal, and vein to RCA   IRRIGATION AND DEBRIDEMENT ABSCESS Right 12/04/2015   Procedure: MINOR INCISION AND DRAINAGE OF ABSCESS;  Surgeon: Franky Curia, MD;  Location: [facility];  Service: Orthopedics;  Laterality: Right;   TRANSTHORACIC ECHOCARDIOGRAM  03/29/2011   EF ~50%, mild-moderate inferior wall hypokinesis   TRANSURETHRAL RESECTION OF PROSTATE N/A 10/25/2013   Procedure: TRANSURETHRAL RESECTION OF THE PROSTATE WITH GYRUS BUTTON;  Surgeon: Norleen Seltzer, MD;  Location: [facility];  Service: Urology;  Laterality: N/A;   Social History: He is married. He has 1 son. Retired [employer]. Past smoker, quit smoking cigarettes 30+ years ago.  No alcohol use.  No drug use.  Family History: Father with history of heart disease.  Brother with hypertension.  Mother with kidney disease.  No known family history of esophageal, gastric or colon cancer.  family history includes Heart disease in his father; Hypertension in his brother; Kidney disease in his mother. Allergies  Allergen Reactions   Peanut-Containing Drug Products Swelling      Outpatient Encounter  Medications as of 04/11/2024  Medication Sig   amoxicillin -clavulanate (AUGMENTIN ) 875-125 MG tablet Take 1 tablet by mouth 2 (two) times daily. (Patient not taking: Reported on 03/15/2024)   azithromycin  (ZITHROMAX ) 250 MG tablet Take 1 tablet (250 mg total) by mouth daily. Take first 2 tablets together, then 1 every day until finished. (Patient not taking: Reported on 02/21/2024)   carvedilol  (COREG ) 6.25 MG tablet TAKE 1 TABLET TWICE DAILY WITH MEALS   Cinnamon 500 MG capsule Take 1,000 mg by mouth daily.   Cranberry 360 MG CAPS Take by mouth.   Dextromethorphan-guaiFENesin (MUCINEX FAST-MAX DM MAX) 5-100 MG/5ML LIQD Take 20 mLs by mouth 2 (two) times daily as needed (Cough). (Patient  not taking: Reported on 03/15/2024)   dolutegravir -lamiVUDine  (DOVATO ) 50-300 MG tablet Take 1 tablet by mouth daily.   ELIQUIS  2.5 MG TABS tablet TAKE 1 TABLET TWICE DAILY   ketoconazole  (NIZORAL ) 2 % cream Apply 1 Application topically daily. Apply 1gm between affected toes once daily   mirabegron  ER (MYRBETRIQ ) 25 MG TB24 tablet Take 1 tablet (25 mg total) by mouth daily.   Multiple Vitamins-Minerals (MULTIVITAMIN WITH MINERALS) tablet Take 1 tablet by mouth daily.   niacin  (NIASPAN ) 1000 MG CR tablet TAKE 2 TABLETS AT BEDTIME   omeprazole  (PRILOSEC) 20 MG capsule Take 1 capsule (20 mg total) by mouth daily.   pravastatin  (PRAVACHOL ) 40 MG tablet TAKE 1 TABLET EVERY DAY (OBTAIN FURTHER REFILLS FROM PRIMARY CARE PROVIDER)   vitamin E 400 UNIT capsule Take 400 Units by mouth daily.   No facility-administered encounter medications on file as of 04/11/2024.   REVIEW OF SYSTEMS:  Gen: See HPI. CV: Denies chest pain, palpitations or edema. Resp: Denies cough, shortness of breath of hemoptysis.  GI: Denies heartburn, dysphagia, stomach or lower abdominal pain. No diarrhea or constipation.  GU: Denies urinary burning, blood in urine, increased urinary frequency or incontinence. MS: Denies joint pain, muscles aches or  weakness. Derm: Denies rash, itchiness, skin lesions or unhealing ulcers. Psych: + Memory impairment.  Heme: Denies bruising, easy bleeding. Neuro:  Denies headaches, dizziness or paresthesias. Endo:  Denies any problems with DM, thyroid  or adrenal function.  PHYSICAL EXAM: Pulse 90   Ht 5' 7 (1.702 m)   Wt 136 lb 9.6 oz (62 kg)   BMI 21.39 kg/m  Wt Readings from Last 3 Encounters:  04/11/24 136 lb 9.6 oz (62 kg)  03/15/24 145 lb (65.8 kg)  03/07/24 149 lb (67.6 kg)   11/02/2023       153 lbs  General: 83 year old male in no acute distress. Head: Normocephalic and atraumatic. Eyes:  Sclerae non-icteric, conjunctive pink. Ears: Normal auditory acuity. Mouth: Dentition intact. No ulcers or lesions.  Neck: Supple, no lymphadenopathy or thyromegaly.  Lungs: Clear bilaterally to auscultation without wheezes, crackles or rhonchi. Heart: Regular rate and rhythm. No murmur, rub or gallop appreciated.  Abdomen: Soft, nontender, nondistended. No masses. No hepatosplenomegaly. Normoactive bowel sounds x 4 quadrants.  Rectal: Deferred. Musculoskeletal: Symmetrical with no gross deformities. Skin: Warm and dry. No rash or lesions on visible extremities. Extremities: LE edema, R > L.  Neurological: Alert oriented x 3, no focal deficits.  Psychological: Alert and cooperative. Normal mood and affect.  ASSESSMENT AND PLAN:  83 year old male with oral phase dysphagia x 7 months - Barium swallow study with tablet - Consider SLP evaluation, await barium swallow study results - Defer endoscopic recommendations until barium swallow study results reviewed  Weight loss, secondary to above - Glucerna 2 cans daily - Recommend chest/abdominal/pelvic CT scan if weight loss persists  GERD, stable - Continue Omeprazole  20 mg daily  Ischemic heart disease s/p 3 vessel CABG 1999   Atrial fibrillation on Eliquis   DM type II  HIV on Dovato , followed by ID  Hypertension, BP 87/66, patient  asymptomatic  -Patient and wife instructed to contact cardiologist regarding low BP     CC:  [name], MD

## 2024-04-12 NOTE — Progress Notes (Signed)
 Noted

## 2024-04-13 ENCOUNTER — Ambulatory Visit (HOSPITAL_COMMUNITY)
Admission: RE | Admit: 2024-04-13 | Discharge: 2024-04-13 | Disposition: A | Discharge status: Home / Self Care | Payer: MEDICARE | Source: Ambulatory Visit | Attending: Nurse Practitioner | Admitting: Nurse Practitioner

## 2024-04-13 DIAGNOSIS — Q394 Esophageal web: Secondary | ICD-10-CM | POA: Diagnosis not present

## 2024-04-13 DIAGNOSIS — R1310 Dysphagia, unspecified: Secondary | ICD-10-CM | POA: Diagnosis not present

## 2024-04-13 DIAGNOSIS — R1312 Dysphagia, oropharyngeal phase: Secondary | ICD-10-CM | POA: Insufficient documentation

## 2024-04-13 DIAGNOSIS — K219 Gastro-esophageal reflux disease without esophagitis: Secondary | ICD-10-CM | POA: Diagnosis not present

## 2024-04-13 DIAGNOSIS — K224 Dyskinesia of esophagus: Secondary | ICD-10-CM | POA: Diagnosis not present

## 2024-04-16 ENCOUNTER — Other Ambulatory Visit: Payer: Self-pay

## 2024-04-16 ENCOUNTER — Ambulatory Visit: Payer: Self-pay | Admitting: Nurse Practitioner

## 2024-04-16 DIAGNOSIS — R1312 Dysphagia, oropharyngeal phase: Secondary | ICD-10-CM

## 2024-04-16 NOTE — Progress Notes (Signed)
 Specialty Pharmacy Refill Coordination Note  Jeremy Reeves is a 83 y.o. male contacted today regarding refills of specialty medication(s) Dolutegravir -lamiVUDine  (Dovato )   Patient requested [facility] at [facility] [place] date: 04/16/24   Medication will be filled on 04/16/24.

## 2024-04-18 ENCOUNTER — Ambulatory Visit: Payer: MEDICARE | Admitting: Physician Assistant

## 2024-04-18 ENCOUNTER — Ambulatory Visit: Payer: MEDICARE

## 2024-04-18 ENCOUNTER — Telehealth: Payer: Self-pay | Admitting: Nurse Practitioner

## 2024-04-18 NOTE — Telephone Encounter (Signed)
 I forwarded the barium swallow results to Dr. Abran as I would like his input before I contact the patient and family with the results.

## 2024-04-18 NOTE — Telephone Encounter (Signed)
Pt calling for results of barium swallow, please advise. 

## 2024-04-18 NOTE — Telephone Encounter (Signed)
 See barium swallow study notes with Dr. Nancyann recommendations.

## 2024-04-18 NOTE — Telephone Encounter (Signed)
 PT's wife is calling to get results from the barium swallow done on 6/27. Please advise.

## 2024-04-26 ENCOUNTER — Emergency Department (HOSPITAL_COMMUNITY): Payer: MEDICARE

## 2024-04-26 ENCOUNTER — Emergency Department (HOSPITAL_COMMUNITY)
Admission: EM | Admit: 2024-04-26 | Discharge: 2024-04-27 | Disposition: A | Discharge status: Home / Self Care | Payer: MEDICARE | Attending: Emergency Medicine | Admitting: Emergency Medicine

## 2024-04-26 DIAGNOSIS — I129 Hypertensive chronic kidney disease with stage 1 through stage 4 chronic kidney disease, or unspecified chronic kidney disease: Secondary | ICD-10-CM | POA: Diagnosis not present

## 2024-04-26 DIAGNOSIS — R531 Weakness: Secondary | ICD-10-CM | POA: Insufficient documentation

## 2024-04-26 DIAGNOSIS — Z7901 Long term (current) use of anticoagulants: Secondary | ICD-10-CM | POA: Diagnosis not present

## 2024-04-26 DIAGNOSIS — R748 Abnormal levels of other serum enzymes: Secondary | ICD-10-CM | POA: Diagnosis not present

## 2024-04-26 DIAGNOSIS — R479 Unspecified speech disturbances: Secondary | ICD-10-CM | POA: Insufficient documentation

## 2024-04-26 DIAGNOSIS — S3993XA Unspecified injury of pelvis, initial encounter: Secondary | ICD-10-CM | POA: Diagnosis not present

## 2024-04-26 DIAGNOSIS — Z951 Presence of aortocoronary bypass graft: Secondary | ICD-10-CM | POA: Insufficient documentation

## 2024-04-26 DIAGNOSIS — Z21 Asymptomatic human immunodeficiency virus [HIV] infection status: Secondary | ICD-10-CM | POA: Insufficient documentation

## 2024-04-26 DIAGNOSIS — E1122 Type 2 diabetes mellitus with diabetic chronic kidney disease: Secondary | ICD-10-CM | POA: Insufficient documentation

## 2024-04-26 DIAGNOSIS — R55 Syncope and collapse: Secondary | ICD-10-CM | POA: Diagnosis not present

## 2024-04-26 DIAGNOSIS — M5030 Other cervical disc degeneration, unspecified cervical region: Secondary | ICD-10-CM | POA: Diagnosis not present

## 2024-04-26 DIAGNOSIS — Z91010 Allergy to peanuts: Secondary | ICD-10-CM | POA: Diagnosis not present

## 2024-04-26 DIAGNOSIS — E876 Hypokalemia: Secondary | ICD-10-CM | POA: Insufficient documentation

## 2024-04-26 DIAGNOSIS — W19XXXA Unspecified fall, initial encounter: Secondary | ICD-10-CM | POA: Diagnosis not present

## 2024-04-26 DIAGNOSIS — R410 Disorientation, unspecified: Secondary | ICD-10-CM | POA: Diagnosis not present

## 2024-04-26 DIAGNOSIS — G9389 Other specified disorders of brain: Secondary | ICD-10-CM | POA: Diagnosis not present

## 2024-04-26 DIAGNOSIS — M47812 Spondylosis without myelopathy or radiculopathy, cervical region: Secondary | ICD-10-CM | POA: Diagnosis not present

## 2024-04-26 DIAGNOSIS — Z043 Encounter for examination and observation following other accident: Secondary | ICD-10-CM | POA: Diagnosis not present

## 2024-04-26 DIAGNOSIS — M160 Bilateral primary osteoarthritis of hip: Secondary | ICD-10-CM | POA: Diagnosis not present

## 2024-04-26 DIAGNOSIS — N1831 Chronic kidney disease, stage 3a: Secondary | ICD-10-CM | POA: Insufficient documentation

## 2024-04-26 DIAGNOSIS — M4802 Spinal stenosis, cervical region: Secondary | ICD-10-CM | POA: Diagnosis not present

## 2024-04-26 LAB — CBC WITH DIFFERENTIAL/PLATELET
Abs Immature Granulocytes: 0.03 K/uL (ref 0.00–0.07)
Basophils Absolute: 0 K/uL (ref 0.0–0.1)
Basophils Relative: 1 %
Eosinophils Absolute: 0.2 K/uL (ref 0.0–0.5)
Eosinophils Relative: 3 %
HCT: 47.1 % (ref 39.0–52.0)
Hemoglobin: 16 g/dL (ref 13.0–17.0)
Immature Granulocytes: 1 %
Lymphocytes Relative: 34 %
Lymphs Abs: 1.9 K/uL (ref 0.7–4.0)
MCH: 29.7 pg (ref 26.0–34.0)
MCHC: 34 g/dL (ref 30.0–36.0)
MCV: 87.5 fL (ref 80.0–100.0)
Monocytes Absolute: 0.6 K/uL (ref 0.1–1.0)
Monocytes Relative: 11 %
Neutro Abs: 2.9 K/uL (ref 1.7–7.7)
Neutrophils Relative %: 50 %
Platelets: 67 K/uL — ABNORMAL LOW (ref 150–400)
RBC: 5.38 MIL/uL (ref 4.22–5.81)
RDW: 17 % — ABNORMAL HIGH (ref 11.5–15.5)
WBC: 5.6 K/uL (ref 4.0–10.5)
nRBC: 0.5 % — ABNORMAL HIGH (ref 0.0–0.2)

## 2024-04-26 NOTE — ED Provider Notes (Signed)
 [place] EMERGENCY DEPARTMENT AT [facility] Provider Note   CSN: [id number] Arrival date & time: 04/26/24  2301     Patient presents with: No chief complaint on file.   Jeremy Reeves is a 82 y.o. male.   83 year old male that presents the ER with EMS secondary to a fall.  Patient had a witnessed fall in the shower.  No evidence of head trauma.  Patient denies any pain at this time.  He has advanced undiagnosed memory issues and thus does not remember much about the history or his fall.  History limited related to the same.  On review of records it does appear he is on Eliquis , carvedilol , pravastatin .  According the notes he has a history of hypertension, hyperlipidemia, ischemic coronary artery s/p 3 vessel CABG 1999, atrial fibrillation on Eliquis , diabetes mellitus type 2, CKD stage IIIa, history of difficult intubation in 1999, HIV positive, cognitive impairment, BPH, thrombocytopenia, GERD and a rectal polyp.   Wife arrives and states that he is at his new baseline but this is only been for about the last week.  She states over the last month is gotten progressively worsened with his generalized weakness, weight loss, difficulty with memory and speech.  She is nursing and speech and language pathologist and are scheduled to see a neurologist soon.        Prior to Admission medications   Medication Sig Start Date End Date Taking? Authorizing Provider  carvedilol  (COREG ) 6.25 MG tablet TAKE 1 TABLET TWICE DAILY WITH MEALS 12/21/23   Lalonde, Javan C, MD  Cinnamon 500 MG capsule Take 1,000 mg by mouth daily.    [provider]  Cranberry 360 MG CAPS Take by mouth.    [provider]  dolutegravir -lamiVUDine  (DOVATO ) 50-300 MG tablet Take 1 tablet by mouth daily. 11/02/23   Luiz Channel, MD  ELIQUIS  2.5 MG TABS tablet TAKE 1 TABLET TWICE DAILY 10/03/23   Court Dorn PARAS, MD  ketoconazole  (NIZORAL ) 2 % cream Apply 1 Application topically daily. Apply  1gm between affected toes once daily 11/21/23   McCaughan, Dia D, DPM  mirabegron  ER (MYRBETRIQ ) 25 MG TB24 tablet Take 1 tablet (25 mg total) by mouth daily. 09/06/22   Lalonde, Breck C, MD  Multiple Vitamins-Minerals (MULTIVITAMIN WITH MINERALS) tablet Take 1 tablet by mouth daily.    [provider]  niacin  (NIASPAN ) 1000 MG CR tablet TAKE 2 TABLETS AT BEDTIME 11/16/23   Court Dorn PARAS, MD  omeprazole  (PRILOSEC) 20 MG capsule Take 1 capsule (20 mg total) by mouth daily. 07/11/23   Court Dorn PARAS, MD  pravastatin  (PRAVACHOL ) 40 MG tablet TAKE 1 TABLET EVERY DAY (OBTAIN FURTHER REFILLS FROM PRIMARY CARE PROVIDER) 04/09/24   [name], MD  vitamin E 400 UNIT capsule Take 400 Units by mouth daily.    [provider]    Allergies: Peanut-containing drug products    Review of Systems  Updated Vital Signs There were no vitals taken for this visit.  Physical Exam Vitals and nursing note reviewed.  Constitutional:      Appearance: He is well-developed.  HENT:     Head: Normocephalic and atraumatic.  Cardiovascular:     Rate and Rhythm: Normal rate.  Pulmonary:     Effort: Pulmonary effort is normal. No respiratory distress.  Abdominal:     General: There is no distension.  Musculoskeletal:        General: Normal range of motion.     Cervical  back: Normal range of motion.     Comments: No cervical spine tenderness, thoracic spine tenderness or Lumbar spine tenderness.  No tenderness or pain with palpation and full ROM of all joints in upper and lower extremities.  No ecchymosis or other signs of trauma on back or extremities.  No Pain with AP or lateral compression of ribs.  No Paracervical ttp, paraspinal ttp   Skin:    Comments: Lipoma of the left lower posterior scalp/upper neck, no ecchymosis or other evidence of injury.  Neurological:     Mental Status: He is alert. Mental status is at baseline.     (all labs ordered are listed, but only abnormal  results are displayed) Labs Reviewed  CBC WITH DIFFERENTIAL/PLATELET  COMPREHENSIVE METABOLIC PANEL WITH GFR  URINALYSIS, ROUTINE W REFLEX MICROSCOPIC    EKG: None  Radiology: No results found.   Procedures   Medications Ordered in the ED - No data to display                                  Medical Decision Making Amount and/or Complexity of Data Reviewed Labs: ordered. Radiology: ordered.  Risk Prescription drug management.  Is on Eliquis  but no evidence of head trauma, really no evidence of trauma anywhere so trauma code downgraded.  I am told he is at his baseline however with fall possible syncopal episode we will check labs and imaging for the same.  Will discuss more with wife when she arrives on other possible workup that might be needed.  With new history from wife that sounds like this is gotten significantly worse over the last week.  Patient may need MRI or admission for further management workup.  Patient's workup ultimately reassuring.  Patient able to ambulate at his baseline.  Mental status seems to be at baseline.  Vital signs are pretty consistently close to normal.  Family comfortable taking him home and able to return quickly if any new or worsening symptoms.  Will follow-up closely with her outpatient physicians as already scheduled.       Final diagnoses:  Fall, initial encounter  Generalized weakness    ED Discharge Orders     None          Yocheved Depner, Selinda, MD 05/08/24 [phone]

## 2024-04-26 NOTE — ED Triage Notes (Signed)
 Pt presents from home after unwitnessed fall in the shower, pt wife heard him fall. Pt and his wife state they do not believe pt hit his head. Pt wife reports pt has lost roughly 6 lbs in the last month, and has had memory issues for the last week. BLE edema. Denies pain, arrives wearing c collar. Takes eliquis .  Pt oriented to self. Pt downgraded after arrival by Dr. Lorette.

## 2024-04-27 ENCOUNTER — Other Ambulatory Visit: Payer: Self-pay

## 2024-04-27 ENCOUNTER — Emergency Department (HOSPITAL_COMMUNITY): Payer: MEDICARE

## 2024-04-27 ENCOUNTER — Ambulatory Visit (HOSPITAL_COMMUNITY): Payer: MEDICARE

## 2024-04-27 DIAGNOSIS — R748 Abnormal levels of other serum enzymes: Secondary | ICD-10-CM | POA: Diagnosis not present

## 2024-04-27 LAB — COMPREHENSIVE METABOLIC PANEL WITH GFR
ALT: 103 U/L — ABNORMAL HIGH (ref 0–44)
AST: 176 U/L — ABNORMAL HIGH (ref 15–41)
Albumin: 3.4 g/dL — ABNORMAL LOW (ref 3.5–5.0)
Alkaline Phosphatase: 221 U/L — ABNORMAL HIGH (ref 38–126)
Anion gap: 14 (ref 5–15)
BUN: 20 mg/dL (ref 8–23)
CO2: 28 mmol/L (ref 22–32)
Calcium: 8.7 mg/dL — ABNORMAL LOW (ref 8.9–10.3)
Chloride: 100 mmol/L (ref 98–111)
Creatinine, Ser: 1.6 mg/dL — ABNORMAL HIGH (ref 0.61–1.24)
GFR, Estimated: 43 mL/min — ABNORMAL LOW (ref >60)
Glucose, Bld: 105 mg/dL — ABNORMAL HIGH (ref 70–99)
Potassium: 3.2 mmol/L — ABNORMAL LOW (ref 3.5–5.1)
Sodium: 142 mmol/L (ref 135–145)
Total Bilirubin: 2.6 mg/dL — ABNORMAL HIGH (ref 0.0–1.2)
Total Protein: 6.5 g/dL (ref 6.5–8.1)

## 2024-04-27 LAB — URINALYSIS, ROUTINE W REFLEX MICROSCOPIC
Bacteria, UA: NONE SEEN
Bilirubin Urine: NEGATIVE
Glucose, UA: NEGATIVE mg/dL
Hgb urine dipstick: NEGATIVE
Ketones, ur: 5 mg/dL — AB
Leukocytes,Ua: NEGATIVE
Nitrite: NEGATIVE
Protein, ur: 30 mg/dL — AB
Specific Gravity, Urine: 1.02 (ref 1.005–1.030)
pH: 6 (ref 5.0–8.0)

## 2024-04-27 MED ORDER — LACTATED RINGERS IV BOLUS
1000.0000 mL | Freq: Once | INTRAVENOUS | Status: AC
  Administered 2024-04-27: 1000 mL via INTRAVENOUS

## 2024-04-27 MED ORDER — POTASSIUM CHLORIDE CRYS ER 20 MEQ PO TBCR
40.0000 meq | EXTENDED_RELEASE_TABLET | Freq: Once | ORAL | Status: DC
  Filled 2024-04-27: qty 2

## 2024-04-27 MED ORDER — POTASSIUM CHLORIDE 20 MEQ PO PACK
40.0000 meq | PACK | Freq: Two times a day (BID) | ORAL | Status: DC
  Administered 2024-04-27: 40 meq via ORAL
  Filled 2024-04-27: qty 2

## 2024-04-27 MED ORDER — POTASSIUM CHLORIDE 10 MEQ/100ML IV SOLN
10.0000 meq | Freq: Once | INTRAVENOUS | Status: AC
  Administered 2024-04-27: 10 meq via INTRAVENOUS
  Filled 2024-04-27: qty 100

## 2024-04-27 NOTE — ED Notes (Signed)
 Patient transported to Ultrasound

## 2024-05-04 ENCOUNTER — Other Ambulatory Visit: Payer: Self-pay | Admitting: Cardiovascular Disease

## 2024-05-04 ENCOUNTER — Other Ambulatory Visit: Payer: Self-pay

## 2024-05-08 ENCOUNTER — Ambulatory Visit: Payer: MEDICARE

## 2024-05-08 ENCOUNTER — Ambulatory Visit: Payer: MEDICARE | Admitting: Physician Assistant

## 2024-05-09 ENCOUNTER — Other Ambulatory Visit: Payer: Self-pay

## 2024-05-09 ENCOUNTER — Encounter: Payer: Self-pay | Admitting: Family Medicine

## 2024-05-09 ENCOUNTER — Ambulatory Visit (INDEPENDENT_AMBULATORY_CARE_PROVIDER_SITE_OTHER): Payer: MEDICARE | Admitting: Family Medicine

## 2024-05-09 VITALS — BP 116/70 | HR 84 | Wt 131.0 lb

## 2024-05-09 DIAGNOSIS — R7989 Other specified abnormal findings of blood chemistry: Secondary | ICD-10-CM

## 2024-05-09 DIAGNOSIS — R748 Abnormal levels of other serum enzymes: Secondary | ICD-10-CM

## 2024-05-09 DIAGNOSIS — Z9181 History of falling: Secondary | ICD-10-CM

## 2024-05-09 NOTE — Progress Notes (Signed)
   Subjective:    Patient ID: Jeremy Reeves, male    DOB: May 05, 1941, 83 y.o.   MRN: [id number]  HPI He is here for consult concerning a recent fall.  He is on Eliquis .  He did go to the emergency room and I did evaluate him to make sure there is no excessive bleeding.  He has been having difficulty with weight loss as well as inability to eat.  He is in the process of being evaluated for this.  He has a speech path appointment on September 8.  He has also been seen by GI.  He does have some dysmotility issue.  Recent blood work in the emergency room did show evidence increased creatinine as well as liver enzymes.   Review of Systems     Objective:    Physical Exam Alert and in no distress.  Cardiac and lung exam is normal.  Abdominal exam shows no masses or tenderness.  Ecchymotic changes noted to the arms.       Assessment & Plan:  History of fall - Plan: AMB Referral VBCI Care Management  Elevated liver enzymes - Plan: CBC with Differential/Platelet, Comprehensive metabolic panel with GFR  Elevated serum creatinine - Plan: Comprehensive metabolic panel with GFR He has had a fairly good workup concerning his weight loss and this does seem to be a GI related issue of swallowing and dysmotility.  Definitely disconcerting that his liver function tests look abnormal.  Hopefully the renal improved with better and hydration.  Follow-up pending blood results.

## 2024-05-10 ENCOUNTER — Telehealth: Payer: Self-pay

## 2024-05-10 ENCOUNTER — Ambulatory Visit: Payer: Self-pay | Admitting: Family Medicine

## 2024-05-10 ENCOUNTER — Telehealth: Payer: Self-pay | Admitting: *Deleted

## 2024-05-10 ENCOUNTER — Other Ambulatory Visit: Payer: Self-pay

## 2024-05-10 ENCOUNTER — Other Ambulatory Visit: Payer: Self-pay | Admitting: Pharmacy Technician

## 2024-05-10 LAB — CBC WITH DIFFERENTIAL/PLATELET
Basophils Absolute: 0 x10E3/uL (ref 0.0–0.2)
Basos: 1 %
EOS (ABSOLUTE): 0.1 x10E3/uL (ref 0.0–0.4)
Eos: 3 %
Hematocrit: 46.3 % (ref 37.5–51.0)
Hemoglobin: 15.4 g/dL (ref 13.0–17.7)
Immature Grans (Abs): 0 x10E3/uL (ref 0.0–0.1)
Immature Granulocytes: 0 %
Lymphocytes Absolute: 1.5 x10E3/uL (ref 0.7–3.1)
Lymphs: 34 %
MCH: 29.7 pg (ref 26.6–33.0)
MCHC: 33.3 g/dL (ref 31.5–35.7)
MCV: 89 fL (ref 79–97)
Monocytes Absolute: 0.5 x10E3/uL (ref 0.1–0.9)
Monocytes: 10 %
Neutrophils Absolute: 2.3 x10E3/uL (ref 1.4–7.0)
Neutrophils: 52 %
Platelets: 100 x10E3/uL — CL (ref 150–450)
RBC: 5.19 x10E6/uL (ref 4.14–5.80)
RDW: 19.4 % — ABNORMAL HIGH (ref 11.6–15.4)
WBC: 4.5 x10E3/uL (ref 3.4–10.8)

## 2024-05-10 LAB — COMPREHENSIVE METABOLIC PANEL WITH GFR
ALT: 94 IU/L — AB (ref 0–44)
AST: 134 IU/L — AB (ref 0–40)
Albumin: 3.7 g/dL (ref 3.7–4.7)
Alkaline Phosphatase: 312 IU/L — AB (ref 44–121)
BUN/Creatinine Ratio: 13 (ref 10–24)
BUN: 17 mg/dL (ref 8–27)
Bilirubin Total: 2.3 mg/dL — AB (ref 0.0–1.2)
CO2: 28 mmol/L (ref 20–29)
Calcium: 8.9 mg/dL (ref 8.6–10.2)
Chloride: 96 mmol/L (ref 96–106)
Creatinine, Ser: 1.27 mg/dL (ref 0.76–1.27)
Globulin, Total: 2.7 g/dL (ref 1.5–4.5)
Glucose: 116 mg/dL — AB (ref 70–99)
Potassium: 3.6 mmol/L (ref 3.5–5.2)
Sodium: 142 mmol/L (ref 134–144)
Total Protein: 6.4 g/dL (ref 6.0–8.5)
eGFR: 57 mL/min/1.73 — AB (ref >59)

## 2024-05-10 NOTE — Progress Notes (Signed)
 Complex Care Management Note Care Guide Note  05/10/2024 Name: RENEE BEALE MRN: [id number] DOB: [birth year]   Complex Care Management Outreach Attempts: An unsuccessful telephone outreach was attempted today to offer the patient information about available complex care management services.  Follow Up Plan:  Additional outreach attempts will be made to offer the patient complex care management information and services.   Encounter Outcome:  No Answer  [facility] HealthPopulation Health Care Guide  Direct Dial:[phone] Fax:[phone] Website: [email or web address]

## 2024-05-10 NOTE — Progress Notes (Signed)
 Complex Care Management Note  Care Guide Note 05/10/2024 Name: AXTON CIHLAR MRN: [id number] DOB: 06/28/1941  Norleen SHAUNNA Pittsley is a 83 y.o. year old male who sees Joyce, Norleen BROCKS, MD for primary care. I reached out to Norleen SHAUNNA Margo by phone today to offer complex care management services.  Mr. Collymore was given information about Complex Care Management services today including:   The Complex Care Management services include support from the care team which includes your Nurse Care Manager, Clinical Social Worker, or Pharmacist.  The Complex Care Management team is here to help remove barriers to the health concerns and goals most important to you. Complex Care Management services are voluntary, and the patient may decline or stop services at any time by request to their care team member.   Complex Care Management Consent Status: Patient agreed to services and verbal consent obtained.   Follow up plan:  Telephone appointment with complex care management team member scheduled for:  05/15/24 @ 2 PM and 06/08/24 @ 1 pm.   Encounter Outcome:  Patient Scheduled   Leotis Rase [facility], [facility] Guide  Direct Dial: [phone]  Fax [phone]

## 2024-05-10 NOTE — Progress Notes (Signed)
 Specialty Pharmacy Refill Coordination Note  Jeremy Reeves is a 83 y.o. male contacted today regarding refills of specialty medication(s) Dolutegravir -lamiVUDine  (Dovato )   Patient requested Pickup at [facility] at [place] date: 05/11/24   Medication will be filled on 05/11/24.   Spoke to patient's spouse.

## 2024-05-10 NOTE — Addendum Note (Signed)
 Addended by: [name] on: 05/10/2024 01:39 PM   Modules accepted: Orders

## 2024-05-11 ENCOUNTER — Telehealth: Payer: Self-pay | Admitting: *Deleted

## 2024-05-11 ENCOUNTER — Other Ambulatory Visit: Payer: Self-pay

## 2024-05-11 NOTE — Progress Notes (Signed)
 Complex Care Management Note Care Guide Note  05/11/2024 Name: Jeremy Reeves MRN: [id number] DOB: [birth year]  Jeremy Reeves is a 83 y.o. year old male who is a primary care patient of Joyce Jeremy BROCKS, MD . The community resource team was consulted for assistance with Caregiver Stress  SDOH screenings and interventions completed:  Yes     SDOH Interventions Today    Flowsheet Row Most Recent Value  SDOH Interventions   Stress Interventions Community Resources Provided  [facility] mailed caregiver resources as requested by wife]     Care guide performed the following interventions: Patient provided with information about care guide support team and interviewed to confirm resource needs.  Follow Up Plan:  Mailing resources   Encounter Outcome:  Patient Visit Completed Benedict Kue Greenauer-Moran  [facility] HealthPopulation Health Care Guide  Direct Dial:[phone] [phone] Website: [email or web address]

## 2024-05-15 ENCOUNTER — Other Ambulatory Visit: Payer: MEDICARE | Admitting: Licensed Clinical Social Worker

## 2024-05-15 NOTE — Patient Instructions (Signed)
 Visit Information  Thank you for taking time to visit with me today. Please don't hesitate to contact me if I can be of assistance to you before our next scheduled appointment.  Our next appointment is no further scheduled appointments.   Please call the care guide team at [phone] if you need to cancel or reschedule your appointment.   Following is a copy of your care plan:   Goals Addressed             This Visit's Progress    COMPLETED: BSW VBCI Social Work Care Plan       Problems:   Completed the SDOH assessment  CSW Clinical Goal(s):   None no SDOH needs  Interventions:  None, Sw did discuss with patient that if any SDOH needs arise to contact the PCP to get on the SW schedule  Patient Goals/Self-Care Activities:  Meet with RN from [facility] on 06/08/2024 at 1:00 pm  Plan:   Telephone follow up appointment with care management team member scheduled for:  06/08/2024 with RN        Please call the Suicide and Crisis Lifeline: 988 go to [facility] [address] [place] [phone]) call 911 if you are experiencing a Mental Health or Behavioral Health Crisis or need someone to talk to.  The patient verbalized understanding of instructions, educational materials, and care plan provided today and DECLINED offer to receive copy of patient instructions, educational materials, and care plan.   Tobias CHARM Maranda HEDWIG, PhD [facility], [facility] Social Worker Direct Dial: [phone]  Fax: [phone]

## 2024-05-15 NOTE — Patient Outreach (Signed)
 Complex Care Management   Visit Note  05/15/2024  Name:  Jeremy Reeves MRN: [id number] DOB: Jul 21, 1941  Situation: Referral received for Complex Care Management related to SDOH initial  I obtained verbal consent from Caregiver Patient.  Visit completed with Spouse and patient was in the background  on the phone  Background:   Past Medical History:  Diagnosis Date   ASHD (arteriosclerotic heart disease)    BPH (benign prostatic hyperplasia)    Difficult intubation    anesthesia record 1999 / record on chart   Foley catheter in place 10/23/12   GERD (gastroesophageal reflux disease)    HH (hiatus hernia)    Hiatal hernia    HIV (human immunodeficiency virus infection) (HCC) dx'd 2014   Hyperlipidemia    Hypertension    Shingles 8/14   Type II diabetes mellitus (HCC)    pre diabetes    Assessment: SW completed the SDOH initial and no needs at this time   .sdoh Recommendation:   none  Follow Up Plan:   Closing From:  Complex Care Management  Tobias CHARM Maranda HEDWIG, PhD [facility], [facility] Social Worker Direct Dial: [phone]  Fax: [phone]

## 2024-05-16 ENCOUNTER — Other Ambulatory Visit: Payer: Self-pay | Admitting: Family Medicine

## 2024-05-16 DIAGNOSIS — E1159 Type 2 diabetes mellitus with other circulatory complications: Secondary | ICD-10-CM

## 2024-05-22 ENCOUNTER — Ambulatory Visit: Payer: MEDICARE | Admitting: Podiatry

## 2024-05-29 ENCOUNTER — Ambulatory Visit (INDEPENDENT_AMBULATORY_CARE_PROVIDER_SITE_OTHER): Payer: MEDICARE | Admitting: Family Medicine

## 2024-05-29 ENCOUNTER — Encounter: Payer: Self-pay | Admitting: Family Medicine

## 2024-05-29 VITALS — BP 106/64 | HR 58 | Ht 66.0 in | Wt 121.6 lb

## 2024-05-29 DIAGNOSIS — R634 Abnormal weight loss: Secondary | ICD-10-CM | POA: Diagnosis not present

## 2024-05-29 DIAGNOSIS — K224 Dyskinesia of esophagus: Secondary | ICD-10-CM

## 2024-05-29 DIAGNOSIS — R748 Abnormal levels of other serum enzymes: Secondary | ICD-10-CM

## 2024-05-29 NOTE — Progress Notes (Signed)
   Subjective:    Patient ID: Jeremy Reeves, male    DOB: 10/07/1941, 83 y.o.   MRN: [id number]  HPI He is here for recheck.  He continues to lose weight.  He has been seen by gastroenterology and a barium swallow was done which apparently showed some dysmotility.  He is scheduled to be seen in speech pathology September 8.  The GI notes have been evaluated.  There moving slowly with this because of his fragile overall health.  Review of the records also indicates elevated liver enzymes as well as alkaline phosphatase.  His wife states that in the last several days he has been able to eat solid foods and has had to normal BMs.   Review of Systems     Objective:    Physical Exam Alert and in no distress otherwise not examined He has had a 10 pound weight loss since his last visit.      Assessment & Plan:  Elevated liver enzymes - Plan: CBC with Differential/Platelet, Comprehensive metabolic panel with GFR  Esophageal dysmotility - Plan: CBC with Differential/Platelet, Comprehensive metabolic panel with GFR I will repeat his blood work since he did have elevated enzymes as well as alk phos.  It is good news that in the last several days he has been able to eat solid foods however if the blood work comes back abnormal, referral back to GI will be appropriate.

## 2024-05-30 ENCOUNTER — Other Ambulatory Visit: Payer: MEDICARE

## 2024-05-30 DIAGNOSIS — K224 Dyskinesia of esophagus: Secondary | ICD-10-CM | POA: Diagnosis not present

## 2024-05-30 DIAGNOSIS — R748 Abnormal levels of other serum enzymes: Secondary | ICD-10-CM | POA: Diagnosis not present

## 2024-05-31 ENCOUNTER — Ambulatory Visit: Payer: Self-pay | Admitting: Family Medicine

## 2024-05-31 LAB — CBC WITH DIFFERENTIAL/PLATELET
Basophils Absolute: 0 x10E3/uL (ref 0.0–0.2)
Basos: 0 %
EOS (ABSOLUTE): 0.1 x10E3/uL (ref 0.0–0.4)
Eos: 2 %
Hematocrit: 43.8 % (ref 37.5–51.0)
Hemoglobin: 14.6 g/dL (ref 13.0–17.7)
Immature Grans (Abs): 0 x10E3/uL (ref 0.0–0.1)
Immature Granulocytes: 0 %
Lymphocytes Absolute: 1.7 x10E3/uL (ref 0.7–3.1)
Lymphs: 24 %
MCH: 30.2 pg (ref 26.6–33.0)
MCHC: 33.3 g/dL (ref 31.5–35.7)
MCV: 91 fL (ref 79–97)
Monocytes Absolute: 0.5 x10E3/uL (ref 0.1–0.9)
Monocytes: 7 %
Neutrophils Absolute: 4.7 x10E3/uL (ref 1.4–7.0)
Neutrophils: 67 %
Platelets: 59 x10E3/uL — CL (ref 150–450)
RBC: 4.84 x10E6/uL (ref 4.14–5.80)
RDW: 20 % — ABNORMAL HIGH (ref 11.6–15.4)
WBC: 7 x10E3/uL (ref 3.4–10.8)

## 2024-05-31 LAB — COMPREHENSIVE METABOLIC PANEL WITH GFR
ALT: 91 IU/L — ABNORMAL HIGH (ref 0–44)
AST: 138 IU/L — ABNORMAL HIGH (ref 0–40)
Albumin: 3.6 g/dL — ABNORMAL LOW (ref 3.7–4.7)
Alkaline Phosphatase: 387 IU/L — ABNORMAL HIGH (ref 44–121)
BUN/Creatinine Ratio: 14 (ref 10–24)
BUN: 26 mg/dL (ref 8–27)
Bilirubin Total: 4.3 mg/dL — ABNORMAL HIGH (ref 0.0–1.2)
CO2: 26 mmol/L (ref 20–29)
Calcium: 8.8 mg/dL (ref 8.6–10.2)
Chloride: 98 mmol/L (ref 96–106)
Creatinine, Ser: 1.88 mg/dL — ABNORMAL HIGH (ref 0.76–1.27)
Globulin, Total: 2.5 g/dL (ref 1.5–4.5)
Glucose: 135 mg/dL — ABNORMAL HIGH (ref 70–99)
Potassium: 3.4 mmol/L — ABNORMAL LOW (ref 3.5–5.2)
Sodium: 148 mmol/L — ABNORMAL HIGH (ref 134–144)
Total Protein: 6.1 g/dL (ref 6.0–8.5)
eGFR: 35 mL/min/1.73 — ABNORMAL LOW (ref >59)

## 2024-06-01 ENCOUNTER — Other Ambulatory Visit: Payer: Self-pay

## 2024-06-04 ENCOUNTER — Other Ambulatory Visit: Payer: Self-pay

## 2024-06-04 ENCOUNTER — Telehealth: Payer: Self-pay

## 2024-06-04 NOTE — Telephone Encounter (Signed)
 Pt scheduled to see Elida Shawl NP 06/07/24@3 :30pm. Pts wife aware of appt.

## 2024-06-04 NOTE — Telephone Encounter (Signed)
-----   Message from Elida CHRISTELLA Shawl sent at 06/04/2024  4:25 PM EDT ----- Good afternoon Dr. Joyce, when I saw patient in office 04/11/2024 it was for dysphagia and as you noted EGD was deferred at that time.   His LFTs were first noted to be abnormal per ED evaluation 04/26/2024, he presented to the ED due to falling at home. RUQ sono 7/11 was unrevealing. Our office was not previously contacted regarding his elevated LFTs. He needs comprehensive hepatology serologies  and CTAP without IV contrast (creatinine 1.88) vs abdominal MRI/MRCP with and without contrast.   Dottie/Mischele Detter, pls contact patient and schedule him for an office appointment asap regarding elevated LFTs  Thank you for reach out, Colleen ----- Message ----- From: [name], MD Sent: 05/31/2024   8:13 AM EDT To: Elida CHRISTELLA Shawl, NP  I saw him yesterday and his wife says that he has actually had 2 normal meals.  As you can see the blood work is still abnormal especially his alk phos.  He apparently is scheduled for speech path for help with swallowing in early September.  Thoughts on anything else we can do? Jeremy Reeves

## 2024-06-06 ENCOUNTER — Other Ambulatory Visit: Payer: Self-pay | Admitting: Pharmacy Technician

## 2024-06-06 ENCOUNTER — Other Ambulatory Visit: Payer: Self-pay

## 2024-06-06 NOTE — Progress Notes (Signed)
 Specialty Pharmacy Refill Coordination Note  Jeremy Reeves is a 83 y.o. male contacted today regarding refills of specialty medication(s) Dolutegravir -lamiVUDine  (Dovato )  Spoke with Wife   Patient requested Delivery   Delivery date: 06/08/24   Verified address: [address]  [place]   Medication will be filled on 06/07/24.

## 2024-06-07 ENCOUNTER — Other Ambulatory Visit: Payer: MEDICARE

## 2024-06-07 ENCOUNTER — Encounter: Payer: Self-pay | Admitting: Nurse Practitioner

## 2024-06-07 ENCOUNTER — Ambulatory Visit (INDEPENDENT_AMBULATORY_CARE_PROVIDER_SITE_OTHER): Payer: MEDICARE | Admitting: Nurse Practitioner

## 2024-06-07 VITALS — BP 124/60 | HR 64 | Ht 66.0 in | Wt 117.6 lb

## 2024-06-07 DIAGNOSIS — R7989 Other specified abnormal findings of blood chemistry: Secondary | ICD-10-CM

## 2024-06-07 DIAGNOSIS — R1311 Dysphagia, oral phase: Secondary | ICD-10-CM | POA: Diagnosis not present

## 2024-06-07 DIAGNOSIS — R1310 Dysphagia, unspecified: Secondary | ICD-10-CM

## 2024-06-07 DIAGNOSIS — N189 Chronic kidney disease, unspecified: Secondary | ICD-10-CM | POA: Diagnosis not present

## 2024-06-07 DIAGNOSIS — R634 Abnormal weight loss: Secondary | ICD-10-CM

## 2024-06-07 NOTE — Patient Instructions (Addendum)
 Your provider has requested that you go to the basement level for lab work before leaving today. Press B on the elevator. The lab is located at the first door on the left as you exit the elevator.  Puree Diet  Drink 2 cans of Ensure daily.   Hold Pravastatin  for now.   You have been scheduled for a CT scan of the abdomen and pelvis at [facility], 1st floor Radiology. You are scheduled on 06/13/24 at 3:00 pm. You should arrive at 1:00 pm to drink oral contrast and registration for your appointment.  Please follow the written instructions below on the day of your exam:   1) Do not eat anything after 11:00 am  (4 hours prior to your test)    You may take any medications as prescribed with a small amount of water, if necessary. If you take any of the following medications: METFORMIN, GLUCOPHAGE, GLUCOVANCE, AVANDAMET, RIOMET, FORTAMET, ACTOPLUS MET, JANUMET, GLUMETZA or METAGLIP, you MAY be asked to HOLD this medication 48 hours AFTER the exam.   The purpose of you drinking the oral contrast is to aid in the visualization of your intestinal tract. The contrast solution may cause some diarrhea. Depending on your individual set of symptoms, you may also receive an intravenous injection of x-ray contrast/dye. Plan on being at [facility] for 45 minutes or longer, depending on the type of exam you are having performed.   If you have any questions regarding your exam or if you need to reschedule, you may call [facility] at [phone] between the hours of 8:00 am and 5:00 pm, Monday-Friday.   Due to recent changes in healthcare laws, you may see the results of your imaging and laboratory studies on MyChart before your provider has had a chance to review them.  We understand that in some cases there may be results that are confusing or concerning to you. Not all laboratory results come back in the same time frame and the provider may be waiting for multiple results in order to  interpret others.  Please give us  48 hours in order for your provider to thoroughly review all the results before contacting the office for clarification of your results.   _______________________________________________________  If your blood pressure at your visit was 140/90 or greater, please contact your primary care physician to follow up on this.  _______________________________________________________  If you are age 31 or older, your body mass index should be between 23-30. Your Body mass index is 18.98 kg/m. If this is out of the aforementioned range listed, please consider follow up with your Primary Care Provider.  If you are age 22 or younger, your body mass index should be between 19-25. Your Body mass index is 18.98 kg/m. If this is out of the aformentioned range listed, please consider follow up with your Primary Care Provider.   ________________________________________________________  The [facility] providers would like to encourage you to use MYCHART to communicate with providers for non-urgent requests or questions.  Due to long hold times on the telephone, sending your provider a message by [facility] may be a faster and more efficient way to get a response.  Please allow 48 business hours for a response.  Please remember that this is for non-urgent requests.  _______________________________________________________  [facility] is using a team-based approach to care.  Your team is made up of your doctor and two to three APPS. Our APPS (Nurse Practitioners and Physician Assistants) work with your physician to  ensure care continuity for you. They are fully qualified to address your health concerns and develop a treatment plan. They communicate directly with your gastroenterologist to care for you. Seeing the Advanced Practice Practitioners on your physician's team can help you by facilitating care more promptly, often allowing for earlier appointments, access to  diagnostic testing, procedures, and other specialty referrals.    Dysphagia Eating Plan, Pureed This eating plan helps people who are not able to bite or chew food. It is also for people who do not have good control of their tongues. Pureed foods are smooth. They are prepared without lumps so that they can be swallowed safely. Work with your health care provider, nutrition and diet specialist (dietitian), or your speech-language pathologist to make sure you are following the eating plan safely and getting all the nutrients you need. What are tips for following this plan? Cooking If a food is not originally a smooth texture, you may be able to eat the food after: Pureeing it. This can be done with a blender. Moistening it. This can be done by adding juice, cooking liquid, gravy, or sauce to a dry food and then pureeing it. For example, you may have bread if you soak it in milk and puree it. If a food is too thin, you may add a commercial thickener, corn starch, rice cereal, or potato flakes to thicken it. Strain and throw away any excess liquid or liquid that separates from a solid pureed food before eating. Strain lumps, chunks, pulp, and seeds from pureed foods before eating. Reheat foods slowly to prevent a tough crust from forming. Meal planning Eat a variety of foods to get all the nutrients you need. Add dry milk or protein powder to food to increase calories and protein content. Follow your meal plan as told by your dietitian. General information You may eat foods that are soft and have a pudding-like texture. Do not eat foods that you have to chew. If you have to chew the food, then you cannot eat it. Avoid foods that are hard, dry, sticky, chunky, lumpy, or stringy. Also avoid foods with nuts, seeds, raisins, skins, or pulp. You may be instructed to thicken liquids. Follow your health care provider's instructions about how to do this and to what consistency. The foods you may eat are  usually eaten with a spoon. You can use a spoon or fork to test the texture of a food: Using a spoon, you can do a spoon tilt test to check if food holds together on the spoon and is not too firm, too sticky, or too thick. Food should hold its shape on the spoon and slide off easily with almost no food left on the spoon. With food on a fork, the food should sit on a mound or pile on top of the fork and should not seep or drip through the fork prongs continuously. What foods should I eat?  Fruits Pureed fruits such as melons and apples without seeds or pulp. Mashed bananas. Mashed avocado. Fruit juices without pulp or seeds. Vegetables Pureed vegetables. Smooth tomato paste or sauce. Mashed or pureed potatoes without skin. Grains Soft breads, pancakes, [place] toast, muffins, and bread stuffing pureed to a smooth, moist texture, without nuts or seeds. Cooked cereals that have a pudding-like consistency, such as hot wheat cereal or farina. Pureed oatmeal. Pureed, well-cooked pasta and rice. Meats and other proteins Pureed meat, poultry, and fish. Smooth pate or liverwurst. Smooth souffles. Pureed beans such as lentils.  Pureed eggs. Smooth nut and seed butters. Pureed tofu. Dairy Yogurt. Milk. Pureed cottage cheese. Nutritional dairy drinks or shakes. Cream cheese. Smooth pudding, ice cream, sherbet, and malts. Fats and oils Butter. Margarine. Vegetable oils. Smooth and strained gravy. Sour cream. Mayonnaise. Smooth sauces such as white sauce, cheese sauce, or hollandaise sauce. Sweets and desserts Moistened and pureed cookies and cakes. Whipped topping. Gelatin. Pudding pops. Seasonings and other foods Finely ground spices. Jelly. Honey. Pureed casseroles. Strained soups. Pureed sandwiches. Beverages Anything prepared at the consistency recommended by your health care provider. The items listed above may not be a complete list of foods and beverages you can eat. Contact a dietitian for more  information. What foods should I avoid? Fruits Whole fresh, frozen, canned, or dried fruits that have not been pureed. Stringy fruits, such as pineapple or coconut. Watermelon with seeds. Dried fruit or fruit leather. Vegetables Whole vegetables. Stringy vegetables such as celery. Tomatoes or tomato sauce with seeds. Fried vegetables. Grains Oatmeal. Dry cereals. Hard breads. Breads with seeds or nuts. Whole pasta, rice, or other grains. Whole pancakes, waffles, biscuits, muffins, or rolls. Meats and other proteins Whole or ground meat, fish, or poultry. Dried or cooked lentils or legumes that have been cooked but not mashed or pureed. Non-pureed eggs. Nuts and seeds. Crunchy peanut butter. Whole tofu or other meat alternatives. Dairy Cheese cubes or slices. Non-pureed cottage cheese. Yogurt with fruit chunks. Fats and oils All fats and sauces that have lumps or chunks. Sweets and desserts Solid desserts. Sticky, chewy sweets such as licorice and caramel. Candy with nuts or coconut. Seasonings and other foods Coarse or seeded herbs and spices. Chunky preserves. Jams with seeds. Whole sandwiches. Non-pureed casseroles. Chunky soups. The items listed above may not be a complete list of foods and beverages you should avoid. Contact a dietitian for more information. Summary Pureed foods can be helpful for those who have difficulty chewing or problems controlling or moving food with their tongues. On this dysphagia eating plan, you may eat foods that are soft and have a pudding-like texture. Do not eat foods that you have to chew. If you have to chew the food, then you cannot eat it. You may be instructed to thicken liquids. Follow your health care provider's instructions about how to do this and to what consistency. This information is not intended to replace advice given to you by your health care provider. Make sure you discuss any questions you have with your health care provider. Document  Revised: 11/26/2021 Document Reviewed: 11/26/2021 Elsevier Patient Education  2024 Elsevier Inc.Thank you for choosing me and [facility].  Elida Berber- Claudene, CRNP

## 2024-06-07 NOTE — Progress Notes (Signed)
 Agree with workup as outlined. Agree not appropriate for invasive procedures

## 2024-06-07 NOTE — Progress Notes (Signed)
 06/07/2024 Jeremy Reeves [id number] 07-23-1941   Chief Complaint: Elevated LFTs  History of Present Illness: Jeremy Reeves is an 83 year old male with a past medical history of hypertension, hyperlipidemia, ischemic coronary artery disease s/p 3 vessel CABG 1999, atrial fibrillation on Eliquis , diabetes mellitus type 2, CKD stage IIIa, history of difficult intubation in 1999, HIV positive, cognitive impairment, BPH, thrombocytopenia, GERD and a rectal polyp.  I initially saw Jeremy Reeves in office 04/11/2024 as referred by Dr. Montie Bologna for further evaluation regarding dysphagia. Refer to that office visit for comprehensive history review. At the time of his 6/25 office visit, his wife noted he developed dysphagia 08/2023. His input is limited due to cognitive impairment and his wife reported her husband chews food  describes then has difficulty initiating a swallow but once he swallows, the food or liquid passes down the esophagus without any further difficulty. No N/V or abdominal pain. No bloody or black stools. On Omeprazole  20 mg daily due to a prior history of GERD. An EGD was deferred secondary to patient being high risk due to age and multiple comorbidities. 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 Since his symptoms and barium swallow study results were consistent with oral phase dysphagia he was referred to speech therapist for a swallow study, wife stated appointment scheduled 9/8.  He presented to the ED 04/26/2024 secondary to a fall at home without head trauma. Labs in the ED showed new onset elevated LFTs. Labs in the ED  showed WBC 5.6. Hg 16. HCT 47.1. PLT 67. K+ 3.2. BUN 20. Cr 1.60 up from 1.51 three months ago. T. Bili 2.6. Alk phos 221. AST 176. ALT 103. Albumin 3.4. Normal LFTs three months ago. Negative urine Hg and bilirubin levels. A RUQ sonogram showed a normal gallbladder, normal liver without biliary ductal dilation and a patent portal vein. Patient's clinical status and he was discharged home with instructions to follow up with his PCP.   Repeat labs per PCP Dr. Joyce 05/09/2024: WBC 4.5. Hg 15.4. PLT 100. Cr. 1.27. K+ 3.6. T. Bili 2.3. Alk phos 312. AST 134. ALT 94.   Labs 05/30/2024: WBC 7.0. Hg 14.6. Plt 59. Cr 1.88. GFR 35. K+ 3.4. Albumin 3.6. T. Bili 4.3. Alk phos 387. AST 138. ALT 91.   I received communication from Dr. Joyce regarding new onset elevated LFTs. Patient was scheduled for an urgent appointment today. Patient provided very limited input due to significant cognitive impairment, he mostly answers yes or not to questions. His wife answered most of my questions and provided an update regarding his recent symptoms. No alcohol use. It is unclear if he has started any new medications within the past 6 months, possible had an antibiotic recently, further details are unclear. He started Geritol a few months ago. He has been on Pravastatin  for many years. No prior history of liver disease. No NSAID use.   His wife stated he continues to have difficulty swallowing, chews food and he holds the food in his mouth for a few seconds then swallows with effort and the food goes down without any difficulty. He consumes Glucerna x 2 bottles daily, soups and eggs. Today, his wife  pureed chicken and carrots and he ate a moderate amount without significant difficulty. He has lost 36 lbs x 6 to 7 months. Weight today 117.6 lbs.   History of a right submandibular mass, followed by ENT Dr. Tobie. Patient/wife previously declined further work up/MRI per ENT office note 08/25/2023.       Latest Ref Rng & Units  05/30/2024    3:33 PM 05/09/2024    3:02 PM 04/26/2024   11:19 PM  CBC  WBC 3.4 - 10.8 x10E3/uL 7.0  4.5  5.6   Hemoglobin 13.0 - 17.7 g/dL 85.3  84.5  83.9   Hematocrit 37.5 - 51.0 % 43.8  46.3  47.1   Platelets 150 - 450 x10E3/uL 59  100  67         Latest Ref Rng & Units 05/30/2024    3:33 PM 05/09/2024    3:02 PM 04/26/2024   11:19 PM  CMP  Glucose 70 - 99 mg/dL 864  883  894   BUN 8 - 27 mg/dL 26  17  20    Creatinine 0.76 - 1.27 mg/dL 8.11  8.72  8.39   Sodium 134 - 144 mmol/L 148  142  142   Potassium 3.5 - 5.2 mmol/L 3.4  3.6  3.2   Chloride 96 - 106 mmol/L 98  96  100   CO2 20 - 29 mmol/L 26  28  28    Calcium  8.6 - 10.2 mg/dL 8.8  8.9  8.7   Total Protein 6.0 - 8.5 g/dL 6.1  6.4  6.5   Total Bilirubin 0.0 - 1.2 mg/dL 4.3  2.3  2.6   Alkaline Phos 44 - 121 IU/L 387  312  221   AST 0 - 40 IU/L 138  134  176   ALT 0 - 44 IU/L 91  94  103    Hg A1C 6.3 on 05/30/2024  RUQ sonogram 04/27/2024:  FINDINGS: Gallbladder: No gallstones or wall thickening visualized. No sonographic Murphy sign noted by sonographer.   Common bile duct: Diameter: Normal caliber, 2 mm   Liver: Normal echotexture. No mass or biliary ductal dilatation. Portal vein is patent on color Doppler imaging with normal direction of blood flow towards the liver.   Other: None.   IMPRESSION: No acute findings or significant abnormality.  Current Outpatient Medications on File Prior to Visit  Medication Sig Dispense Refill   carvedilol  (COREG ) 6.25 MG tablet TAKE 1 TABLET TWICE DAILY WITH MEALS 180 tablet 0   Cinnamon 500 MG capsule Take 1,000 mg by mouth daily.     Cranberry 360 MG CAPS Take by mouth.     dolutegravir -lamiVUDine  (DOVATO ) 50-300 MG tablet Take 1 tablet by mouth daily. 30 tablet 11   ELIQUIS  2.5 MG TABS tablet TAKE 1 TABLET TWICE DAILY 180 tablet 3   Iron-Vitamins (GERITOL TONIC PO) Take 1 Capful by mouth daily at 12 noon.     ketoconazole  (NIZORAL ) 2 % cream Apply 1 Application topically  daily. Apply 1gm between affected toes once daily 60 g 2   mirabegron  ER (MYRBETRIQ ) 25 MG TB24 tablet Take 1 tablet (25 mg total) by mouth daily. 30 tablet 5   niacin  (NIASPAN ) 1000 MG CR tablet TAKE 2 TABLETS AT BEDTIME 180 tablet 3   omeprazole  (PRILOSEC) 20 MG capsule TAKE 1 CAPSULE EVERY DAY 90 capsule 0   pravastatin  (PRAVACHOL ) 40 MG tablet TAKE 1 TABLET EVERY DAY (OBTAIN FURTHER REFILLS FROM PRIMARY CARE PROVIDER) 90 tablet 3  vitamin E 400 UNIT capsule Take 400 Units by mouth daily.     Multiple Vitamins-Minerals (MULTIVITAMIN WITH MINERALS) tablet Take 1 tablet by mouth daily. (Patient not taking: Reported on 06/07/2024)     No current facility-administered medications on file prior to visit.   Allergies  Allergen Reactions   Peanut-Containing Drug Products Swelling    Current Medications, Allergies, Past Medical History, Past Surgical History, Family History and Social History were reviewed in [employer] record.  Review of Systems:   Constitutional: + Weight loss  Respiratory: Negative for shortness of breath.   Cardiovascular: Negative for chest pain, palpitations and leg swelling.  Gastrointestinal: See HPI.  Musculoskeletal: Negative for back pain or muscle aches.  Neurological: Negative for dizziness, headaches or paresthesias.   Physical Exam: BP 124/60   Pulse 64   Ht 5' 6 (1.676 m)   Wt 117 lb 9.6 oz (53.3 kg)   BMI 18.98 kg/m  Wt Readings from Last 3 Encounters:  05/29/24 121 lb 9.6 oz (55.2 kg)  05/09/24 131 lb (59.4 kg)  04/11/24 136 lb 9.6 oz (62 kg)  11/02/2023     153lb Weight today  117.6 lbs (patient assisted by 2 staff members to obtain standing weight  General: Frail appearing 83 year old male, slow weak gait, required assistance with transitioning from chair to exam table, presents in a wheel chair. In no acute distress. Head: Normocephalic and atraumatic. Eyes: Mild scleral icterus. Conjunctiva pink . Ears: Normal  auditory acuity. Mouth: No ulcers or lesions.  Lungs: Clear throughout to auscultation. Heart: Regular rate and rhythm, no murmur. Abdomen: Soft, concave abdomen, nontender and nondistended. No masses or hepatomegaly. Normal bowel sounds x 4 quadrants.  Rectal: Deferred.  Musculoskeletal: Symmetrical with no gross deformities. Extremities: Left pretib/ankle edema > L. Neurological: Alert to name. Cannot tell me date, place or wife's name.  Psychological: Alert and cooperative. Normal mood and affect  Assessment and Recommendations:  83 year old male with elevated LFTs of unclear etiology. RUQ sono showed a normal gallbladder, normal liver without biliary ductal dilatation. T. Bili 4.3. Alk phos 387. AST 138. ALT91. No alcohol use.  -Hepatic panel, BMP, PT/INR, Hep A total antibody, Hep B Core total antibody, Hep B surface antigen, Hep B surface antibody, Hep C antibody, ANA, SMA, IgG, AMA, A1AT, iron and ferritin -Patient would not be able to tolerate MRI/MRCP due to cognitive impairment, unlikely would be able to remain still or to follow instructions ie: when to hold breath -CTAP with oral contrast only, no IV contrast secondary to Cr level 1.88 -Hold Pravastatin  for now -Will need to establish goals of care with wife patient after the above results received   36 lbs weight loss, oral phase dysphagia a contributing factor, however overall clinical presentation concerning for malignancy  -Chest CT without IV contrast -CTAP with oral contrast only, no IV contrast, as ordered above  -See plan above   Oral phase dysphagia. 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  -EGD deferred  as patient is high risk secondary to multiple comorbidities  -Proceed with swallow study with speech pathologist 9/8 -Puree foods as tolerated -2 bottles of Glucerna or ensure daily   Chronic thrombocytopenia. Normal liver per RUQ sono  04/2024.  AKI on CKD. Cr 1.27 -> 1.88.  Afib on Eliquis    Cognitive impairment   HIV positive on DOVOTO  DM type II

## 2024-06-08 ENCOUNTER — Ambulatory Visit: Payer: Self-pay | Admitting: Nurse Practitioner

## 2024-06-08 ENCOUNTER — Other Ambulatory Visit: Payer: MEDICARE

## 2024-06-08 VITALS — BP 124/60 | Wt 117.0 lb

## 2024-06-08 DIAGNOSIS — N1832 Chronic kidney disease, stage 3b: Secondary | ICD-10-CM

## 2024-06-08 DIAGNOSIS — R1310 Dysphagia, unspecified: Secondary | ICD-10-CM

## 2024-06-08 LAB — HEPATITIS A ANTIBODY, TOTAL: Hepatitis A AB,Total: REACTIVE — AB

## 2024-06-08 NOTE — Patient Outreach (Signed)
 Complex Care Management   Visit Note  06/08/2024  Name:  Jeremy Reeves MRN: [id number] DOB: [birth year]  Situation: Referral received for Complex Care Management related to Dysphagia, Weight Loss, Elevated Liver Enzymes, CKD stage 3b. I obtained verbal consent from Caregiver.  Visit completed with Caregiver wife Aksh Swart on the phone.  Background:   Past Medical History:  Diagnosis Date   ASHD (arteriosclerotic heart disease)    BPH (benign prostatic hyperplasia)    Difficult intubation    anesthesia record 1999 / record on chart   Foley catheter in place 10/23/12   GERD (gastroesophageal reflux disease)    HH (hiatus hernia)    Hiatal hernia    HIV (human immunodeficiency virus infection) (HCC) dx'd 2014   Hyperlipidemia    Hypertension    Shingles 8/14   Type II diabetes mellitus (HCC)    pre diabetes    Assessment: Patient Reported Symptoms:  Cognitive Cognitive Status: Struggling with memory recall, Able to follow simple commands, Normal speech and language skills, Requires Assistance Decision Making, Confused or disoriented Cognitive/Intellectual Conditions Management [RPT]: None reported or documented in medical history or problem list   Health Maintenance Behaviors: Annual physical exam, Healthy diet, Sleep adequate Health Facilitated by: Rest  Neurological Neurological Review of Symptoms: Weakness Neurological Management Strategies: Routine screening, Diet modification, Adequate rest Neurological Self-Management Outcome: 3 (uncertain)  HEENT HEENT Symptoms Reported: No symptoms reported      Cardiovascular Cardiovascular Symptoms Reported: Swelling in legs or feet Does patient have uncontrolled Hypertension?: No Cardiovascular Management Strategies: Medication therapy, Routine screening Weight: 117 lb (53.1 kg) Cardiovascular Self-Management Outcome: 3 (uncertain) Cardiovascular Comment: swelling in right leg, wears compression stockings  Respiratory  Respiratory Symptoms Reported: No symptoms reported    Endocrine Endocrine Symptoms Reported: No symptoms reported Is patient diabetic?: Yes Is patient checking blood sugars at home?: No Endocrine Self-Management Outcome: 4 (good)  Gastrointestinal Gastrointestinal Symptoms Reported: Change in appetite, Unintentional weight loss Gastrointestinal Management Strategies: Diet modification, Nutrition support Gastrointestinal Self-Management Outcome: 3 (uncertain) Gastrointestinal Comment: 2 Glucerna's per day    Genitourinary Genitourinary Symptoms Reported: Incontinence Genitourinary Management Strategies: Incontinence garment/pad  Integumentary Integumentary Symptoms Reported: Skin changes Additional Integumentary Details: dry skin Skin Management Strategies: Routine screening (moisturizer) Skin Self-Management Outcome: 4 (good)  Musculoskeletal Musculoskelatal Symptoms Reviewed: Limited mobility, Unsteady gait, Difficulty walking Musculoskeletal Management Strategies: Medical device, Routine screening, Adequate rest Musculoskeletal Self-Management Outcome: 2 (bad) Falls in the past year?: Yes Number of falls in past year: 2 or more Was there an injury with Fall?: No Fall Risk Category Calculator: 2 Patient Fall Risk Level: Moderate Fall Risk Patient at Risk for Falls Due to: History of fall(s), Impaired balance/gait, Impaired mobility Fall risk Follow up: Falls evaluation completed, Education provided, Falls prevention discussed  Psychosocial Psychosocial Symptoms Reported: Depression - if selected complete PHQ 2-9     Quality of Family Relationships: helpful, involved, supportive Do you feel physically threatened by others?: No    06/08/2024    PHQ2-9 Depression Screening   Caila Cirelli interest or pleasure in doing things Several days  Feeling down, depressed, or hopeless Several days  PHQ-2 - Total Score 2  Trouble falling or staying asleep, or sleeping too much Nearly every day   Feeling tired or having Aengus Sauceda energy Nearly every day  Poor appetite or overeating  Nearly every day  Feeling bad about yourself - or that you are a failure or have let yourself or your family down  (wife isn't sure)  Trouble  concentrating on things, such as reading the newspaper or watching television Not at all  Moving or speaking so slowly that other people could have noticed.  Or the opposite - being so fidgety or restless that you have been moving around a lot more than usual Nearly every day  Thoughts that you would be better off dead, or hurting yourself in some way Not at all  PHQ2-9 Total Score 14  If you checked off any problems, how difficult have these problems made it for you to do your work, take care of things at home, or get along with other people Extremely dIfficult  Depression Interventions/Treatment  (refer to [facility] SW)    Vitals:   06/08/24 1330  BP: 124/60    Medications Reviewed Today     Reviewed by Morgan Clayborne CROME, RN (Registered Nurse) on 06/08/24 at 1358  Med List Status: <None>   Medication Order Taking? Sig Documenting Provider Last Dose Status Informant  carvedilol  (COREG ) 6.25 MG tablet [id number] Yes TAKE 1 TABLET TWICE DAILY WITH MEALS Lalonde, Anas C, MD  Active   Cinnamon 500 MG capsule [id number] Yes Take 1,000 mg by mouth daily. [provider]  Active Spouse/Significant Other, Pharmacy Records  Cranberry 360 MG CAPS [id number] Yes Take by mouth. [provider]  Active Spouse/Significant Other, Pharmacy Records  cyanocobalamin (VITAMIN B12) 1000 MCG tablet [id number] Yes Take 1,000 mcg by mouth daily. [provider]  Active   dolutegravir -lamiVUDine  (DOVATO ) 50-300 MG tablet [id number] Yes Take 1 tablet by mouth daily. Luiz Channel, MD  Active Spouse/Significant Other, Pharmacy Records  ELIQUIS  2.5 MG TABS tablet [id number] Yes TAKE 1 TABLET TWICE DAILY Court Dorn PARAS, MD  Active Spouse/Significant Other, Pharmacy Records   Iron-Vitamins (GERITOL TONIC PO) [id number] Yes Take 1 Capful by mouth daily at 12 noon. [provider]  Active   ketoconazole  (NIZORAL ) 2 % cream [id number] Yes Apply 1 Application topically daily. Apply 1gm between affected toes once daily McCaughan, Dia D, DPM  Active Spouse/Significant Other, Pharmacy Records  mirabegron  ER (MYRBETRIQ ) 25 MG TB24 tablet [id number] Yes Take 1 tablet (25 mg total) by mouth daily. [name], MD  Active Spouse/Significant Other, Pharmacy Records  Multiple Vitamins-Minerals (MULTIVITAMIN WITH MINERALS) tablet [id number]  Take 1 tablet by mouth daily.  Patient not taking: Reported on 06/08/2024   [provider]  Active Spouse/Significant Other, Pharmacy Records  niacin  (NIASPAN ) 1000 MG CR tablet [id number] Yes TAKE 2 TABLETS AT BEDTIME Court Dorn PARAS, MD  Active Spouse/Significant Other, Pharmacy Records  omeprazole  [facility]) 20 MG capsule [id number] Yes TAKE 1 CAPSULE EVERY DAY Court Dorn PARAS, MD  Active   pravastatin  (PRAVACHOL ) 40 MG tablet [id number]  TAKE 1 TABLET EVERY DAY (OBTAIN FURTHER REFILLS FROM PRIMARY CARE PROVIDER)  Patient not taking: Reported on 06/08/2024   [name], MD  Active   vitamin E 400 UNIT capsule [id number] Yes Take 400 Units by mouth daily. [provider]  Active Spouse/Significant Other, Pharmacy Records            Recommendation:   Specialty provider follow-up with Dr. Channel Luiz, Infectious Disease on 06/11/24 at 11:15 AM Specialty provider follow-up with Neuro Speech Evaluation on 06/25/24 at 1:15 PM Collaborate with PCP re: Palliative Care referral   Follow Up Plan:   Telephone follow up appointment date/time:  Monday, September 22 at 1:00 PM Referral to BSW Referral to [facility]    Clayborne Morgan RN BSN CCM [place]  [facility],  Population Health Nurse Care Coordinator  Direct Dial: [phone] Website: [email or web address]

## 2024-06-11 ENCOUNTER — Telehealth: Payer: Self-pay | Admitting: Family Medicine

## 2024-06-11 ENCOUNTER — Emergency Department (HOSPITAL_COMMUNITY): Payer: MEDICARE

## 2024-06-11 ENCOUNTER — Inpatient Hospital Stay (HOSPITAL_COMMUNITY)
Admission: EM | Admit: 2024-06-11 | Discharge: 2024-06-14 | DRG: 391 | Disposition: A | Discharge status: Hospice | Payer: MEDICARE | Attending: Internal Medicine | Admitting: Internal Medicine

## 2024-06-11 ENCOUNTER — Encounter: Payer: Self-pay | Admitting: Internal Medicine

## 2024-06-11 ENCOUNTER — Encounter (HOSPITAL_COMMUNITY): Payer: Self-pay

## 2024-06-11 ENCOUNTER — Other Ambulatory Visit: Payer: Self-pay

## 2024-06-11 ENCOUNTER — Ambulatory Visit: Payer: MEDICARE | Admitting: Internal Medicine

## 2024-06-11 VITALS — BP 83/62 | HR 106 | Temp 97.4°F

## 2024-06-11 DIAGNOSIS — R64 Cachexia: Secondary | ICD-10-CM | POA: Diagnosis present

## 2024-06-11 DIAGNOSIS — E1122 Type 2 diabetes mellitus with diabetic chronic kidney disease: Secondary | ICD-10-CM | POA: Diagnosis present

## 2024-06-11 DIAGNOSIS — N3281 Overactive bladder: Secondary | ICD-10-CM | POA: Diagnosis present

## 2024-06-11 DIAGNOSIS — Z681 Body mass index (BMI) 19 or less, adult: Secondary | ICD-10-CM | POA: Diagnosis not present

## 2024-06-11 DIAGNOSIS — I482 Chronic atrial fibrillation, unspecified: Secondary | ICD-10-CM | POA: Diagnosis present

## 2024-06-11 DIAGNOSIS — E785 Hyperlipidemia, unspecified: Secondary | ICD-10-CM | POA: Diagnosis not present

## 2024-06-11 DIAGNOSIS — F17210 Nicotine dependence, cigarettes, uncomplicated: Secondary | ICD-10-CM | POA: Diagnosis present

## 2024-06-11 DIAGNOSIS — I723 Aneurysm of iliac artery: Secondary | ICD-10-CM | POA: Diagnosis not present

## 2024-06-11 DIAGNOSIS — R9089 Other abnormal findings on diagnostic imaging of central nervous system: Secondary | ICD-10-CM | POA: Diagnosis not present

## 2024-06-11 DIAGNOSIS — Z515 Encounter for palliative care: Secondary | ICD-10-CM

## 2024-06-11 DIAGNOSIS — R634 Abnormal weight loss: Secondary | ICD-10-CM

## 2024-06-11 DIAGNOSIS — Z7901 Long term (current) use of anticoagulants: Secondary | ICD-10-CM | POA: Diagnosis not present

## 2024-06-11 DIAGNOSIS — E861 Hypovolemia: Secondary | ICD-10-CM | POA: Diagnosis present

## 2024-06-11 DIAGNOSIS — N289 Disorder of kidney and ureter, unspecified: Secondary | ICD-10-CM | POA: Diagnosis not present

## 2024-06-11 DIAGNOSIS — Z79899 Other long term (current) drug therapy: Secondary | ICD-10-CM

## 2024-06-11 DIAGNOSIS — Z9181 History of falling: Secondary | ICD-10-CM

## 2024-06-11 DIAGNOSIS — R630 Anorexia: Secondary | ICD-10-CM | POA: Diagnosis not present

## 2024-06-11 DIAGNOSIS — D696 Thrombocytopenia, unspecified: Secondary | ICD-10-CM | POA: Diagnosis present

## 2024-06-11 DIAGNOSIS — R627 Adult failure to thrive: Secondary | ICD-10-CM | POA: Diagnosis not present

## 2024-06-11 DIAGNOSIS — I6782 Cerebral ischemia: Secondary | ICD-10-CM | POA: Diagnosis not present

## 2024-06-11 DIAGNOSIS — Z723 Lack of physical exercise: Secondary | ICD-10-CM

## 2024-06-11 DIAGNOSIS — N400 Enlarged prostate without lower urinary tract symptoms: Secondary | ICD-10-CM | POA: Diagnosis not present

## 2024-06-11 DIAGNOSIS — Z951 Presence of aortocoronary bypass graft: Secondary | ICD-10-CM

## 2024-06-11 DIAGNOSIS — G9341 Metabolic encephalopathy: Secondary | ICD-10-CM | POA: Diagnosis present

## 2024-06-11 DIAGNOSIS — R1310 Dysphagia, unspecified: Principal | ICD-10-CM | POA: Diagnosis present

## 2024-06-11 DIAGNOSIS — R1312 Dysphagia, oropharyngeal phase: Secondary | ICD-10-CM

## 2024-06-11 DIAGNOSIS — R531 Weakness: Secondary | ICD-10-CM | POA: Diagnosis not present

## 2024-06-11 DIAGNOSIS — Z66 Do not resuscitate: Secondary | ICD-10-CM | POA: Diagnosis present

## 2024-06-11 DIAGNOSIS — N179 Acute kidney failure, unspecified: Secondary | ICD-10-CM | POA: Diagnosis present

## 2024-06-11 DIAGNOSIS — I152 Hypertension secondary to endocrine disorders: Secondary | ICD-10-CM | POA: Diagnosis present

## 2024-06-11 DIAGNOSIS — F0390 Unspecified dementia without behavioral disturbance: Secondary | ICD-10-CM | POA: Diagnosis present

## 2024-06-11 DIAGNOSIS — B20 Human immunodeficiency virus [HIV] disease: Secondary | ICD-10-CM | POA: Diagnosis not present

## 2024-06-11 DIAGNOSIS — Z9079 Acquired absence of other genital organ(s): Secondary | ICD-10-CM

## 2024-06-11 DIAGNOSIS — N200 Calculus of kidney: Secondary | ICD-10-CM | POA: Diagnosis not present

## 2024-06-11 DIAGNOSIS — E872 Acidosis, unspecified: Secondary | ICD-10-CM | POA: Diagnosis not present

## 2024-06-11 DIAGNOSIS — E1159 Type 2 diabetes mellitus with other circulatory complications: Secondary | ICD-10-CM | POA: Diagnosis not present

## 2024-06-11 DIAGNOSIS — N1832 Chronic kidney disease, stage 3b: Secondary | ICD-10-CM | POA: Diagnosis not present

## 2024-06-11 DIAGNOSIS — I959 Hypotension, unspecified: Secondary | ICD-10-CM

## 2024-06-11 DIAGNOSIS — K219 Gastro-esophageal reflux disease without esophagitis: Secondary | ICD-10-CM | POA: Diagnosis present

## 2024-06-11 DIAGNOSIS — Z8249 Family history of ischemic heart disease and other diseases of the circulatory system: Secondary | ICD-10-CM | POA: Diagnosis not present

## 2024-06-11 DIAGNOSIS — Z91010 Allergy to peanuts: Secondary | ICD-10-CM

## 2024-06-11 DIAGNOSIS — I2510 Atherosclerotic heart disease of native coronary artery without angina pectoris: Secondary | ICD-10-CM | POA: Diagnosis not present

## 2024-06-11 DIAGNOSIS — Z7189 Other specified counseling: Secondary | ICD-10-CM | POA: Diagnosis not present

## 2024-06-11 DIAGNOSIS — Z841 Family history of disorders of kidney and ureter: Secondary | ICD-10-CM

## 2024-06-11 LAB — COMPREHENSIVE METABOLIC PANEL WITH GFR
ALT: 80 U/L — ABNORMAL HIGH (ref 0–44)
AST: 131 U/L — ABNORMAL HIGH (ref 15–41)
Albumin: 3.2 g/dL — ABNORMAL LOW (ref 3.5–5.0)
Alkaline Phosphatase: 363 U/L — ABNORMAL HIGH (ref 38–126)
Anion gap: 12 (ref 5–15)
BUN: 28 mg/dL — ABNORMAL HIGH (ref 8–23)
CO2: 29 mmol/L (ref 22–32)
Calcium: 8.6 mg/dL — ABNORMAL LOW (ref 8.9–10.3)
Chloride: 103 mmol/L (ref 98–111)
Creatinine, Ser: 1.72 mg/dL — ABNORMAL HIGH (ref 0.61–1.24)
GFR, Estimated: 39 mL/min — ABNORMAL LOW (ref >60)
Glucose, Bld: 132 mg/dL — ABNORMAL HIGH (ref 70–99)
Potassium: 4.2 mmol/L (ref 3.5–5.1)
Sodium: 144 mmol/L (ref 135–145)
Total Bilirubin: 4.2 mg/dL — ABNORMAL HIGH (ref 0.0–1.2)
Total Protein: 6.8 g/dL (ref 6.5–8.1)

## 2024-06-11 LAB — CBC WITH DIFFERENTIAL/PLATELET
Abs Immature Granulocytes: 0.01 K/uL (ref 0.00–0.07)
Basophils Absolute: 0 K/uL (ref 0.0–0.1)
Basophils Relative: 1 %
Eosinophils Absolute: 0.1 K/uL (ref 0.0–0.5)
Eosinophils Relative: 2 %
HCT: 50.6 % (ref 39.0–52.0)
Hemoglobin: 16.7 g/dL (ref 13.0–17.0)
Immature Granulocytes: 0 %
Lymphocytes Relative: 42 %
Lymphs Abs: 1.4 K/uL (ref 0.7–4.0)
MCH: 30.8 pg (ref 26.0–34.0)
MCHC: 33 g/dL (ref 30.0–36.0)
MCV: 93.2 fL (ref 80.0–100.0)
Monocytes Absolute: 0.2 K/uL (ref 0.1–1.0)
Monocytes Relative: 6 %
Neutro Abs: 1.6 K/uL — ABNORMAL LOW (ref 1.7–7.7)
Neutrophils Relative %: 49 %
Platelets: 53 K/uL — ABNORMAL LOW (ref 150–400)
RBC: 5.43 MIL/uL (ref 4.22–5.81)
RDW: 20.6 % — ABNORMAL HIGH (ref 11.5–15.5)
WBC: 3.4 K/uL — ABNORMAL LOW (ref 4.0–10.5)
nRBC: 0.6 % — ABNORMAL HIGH (ref 0.0–0.2)

## 2024-06-11 LAB — ETHANOL: Alcohol, Ethyl (B): <15 mg/dL (ref <15)

## 2024-06-11 LAB — I-STAT CG4 LACTIC ACID, ED
Lactic Acid, Venous: 3.2 mmol/L (ref 0.5–1.9)
Lactic Acid, Venous: 3.1 mmol/L (ref 0.5–1.9)
Lactic Acid, Venous: 2.5 mmol/L (ref 0.5–1.9)

## 2024-06-11 LAB — GAMMA GT: GGT: 590 U/L — ABNORMAL HIGH (ref 7–50)

## 2024-06-11 LAB — TROPONIN I (HIGH SENSITIVITY)
Troponin I (High Sensitivity): 48 ng/L — ABNORMAL HIGH (ref <18)
Troponin I (High Sensitivity): 41 ng/L — ABNORMAL HIGH (ref <18)

## 2024-06-11 LAB — LIPASE, BLOOD: Lipase: 43 U/L (ref 11–51)

## 2024-06-11 MED ORDER — ACETAMINOPHEN 325 MG PO TABS: 650.0000 mg | ORAL_TABLET | Freq: Four times a day (QID) | ORAL | Status: DC | PRN

## 2024-06-11 MED ORDER — SODIUM CHLORIDE 0.9% FLUSH
3.0000 mL | Freq: Two times a day (BID) | INTRAVENOUS | Status: DC
  Administered 2024-06-11 – 2024-06-14 (×3): 3 mL via INTRAVENOUS

## 2024-06-11 MED ORDER — POTASSIUM CHLORIDE CRYS ER 20 MEQ PO TBCR: 40.0000 meq | EXTENDED_RELEASE_TABLET | Freq: Once | ORAL | Status: DC

## 2024-06-11 MED ORDER — SODIUM CHLORIDE 0.9 % IV BOLUS
1000.0000 mL | Freq: Once | INTRAVENOUS | Status: AC
  Administered 2024-06-11: 1000 mL via INTRAVENOUS

## 2024-06-11 MED ORDER — ACETAMINOPHEN 650 MG RE SUPP: 650.0000 mg | Freq: Four times a day (QID) | RECTAL | Status: DC | PRN

## 2024-06-11 MED ORDER — IOHEXOL 350 MG/ML SOLN
80.0000 mL | Freq: Once | INTRAVENOUS | Status: AC | PRN
  Administered 2024-06-11: 80 mL via INTRAVENOUS

## 2024-06-11 MED ORDER — SODIUM CHLORIDE 0.9 % IV SOLN: INTRAVENOUS | Status: DC

## 2024-06-11 MED ORDER — MORPHINE SULFATE (PF) 2 MG/ML IV SOLN: 1.0000 mg | INTRAVENOUS | Status: DC | PRN

## 2024-06-11 NOTE — ED Notes (Signed)
 Pt.lactic acid result reported to [facility] r.rn by at

## 2024-06-11 NOTE — Telephone Encounter (Signed)
-----   Message from Nurse Clayborne CROME sent at 06/08/2024  2:37 PM EDT ----- Regarding: Re: Palliative Care referral needed Hello Dr. Joyce,   I spoke with Mrs. Bezek today regarding Mr. Fester. I am referring them to [facility] for home based primary care. In addition, I have scheduled her with our SW Tobias Moose to assist with other resource needs. I educated Mrs. Warehime about Palliative Care as well as Hospice Care and she would like to get Palliative involved at this point. If you agree, can you please place a Palliative Care referral? Please let me know if I can further assist.   Warmly, Clayborne Ly RN BSN CCM [place]  [facility], [facility] Nurse Care Coordinator  Direct Dial: [phone] Website: [email or web address]

## 2024-06-11 NOTE — ED Notes (Signed)
 Soiled brief removed and a clean dry brief was put back on.

## 2024-06-11 NOTE — Hospital Course (Signed)
 SABRA

## 2024-06-11 NOTE — ED Triage Notes (Signed)
 Patient was at a drs appointment and was brought to the ED due to hypotension. Patient has a history of triple bypass surgery and has not been eating lately due to issues swallowing.

## 2024-06-11 NOTE — ED Notes (Signed)
 Lactic results to krystal r.rn by at

## 2024-06-11 NOTE — ED Notes (Signed)
 Lab called for Gamma GT add on. Phlebotomy at bedside for lactic acid draw.

## 2024-06-11 NOTE — Progress Notes (Signed)
 RFV: follow up for hiv disease Patient ID: Jeremy Reeves, male   DOB: June 21, 1941, 83 y.o.   MRN: [id number]  HPI 83yo M with well controlled hiv disease, on dovato . CAD Since we last saw him, in may he has had #30 lb weight loss. Hypotensive. Transaminitis, failure to thrive. Having dysphagina of late   Outpatient Encounter Medications as of 06/11/2024  Medication Sig   carvedilol  (COREG ) 6.25 MG tablet TAKE 1 TABLET TWICE DAILY WITH MEALS   Cinnamon 500 MG capsule Take 1,000 mg by mouth daily.   Cranberry 360 MG CAPS Take by mouth.   cyanocobalamin (VITAMIN B12) 1000 MCG tablet Take 1,000 mcg by mouth daily.   dolutegravir -lamiVUDine  (DOVATO ) 50-300 MG tablet Take 1 tablet by mouth daily.   ELIQUIS  2.5 MG TABS tablet TAKE 1 TABLET TWICE DAILY   Iron-Vitamins (GERITOL TONIC PO) Take 1 Capful by mouth daily at 12 noon.   ketoconazole  (NIZORAL ) 2 % cream Apply 1 Application topically daily. Apply 1gm between affected toes once daily   mirabegron  ER (MYRBETRIQ ) 25 MG TB24 tablet Take 1 tablet (25 mg total) by mouth daily.   niacin  (NIASPAN ) 1000 MG CR tablet TAKE 2 TABLETS AT BEDTIME   omeprazole  (PRILOSEC) 20 MG capsule TAKE 1 CAPSULE EVERY DAY   vitamin E 400 UNIT capsule Take 400 Units by mouth daily.   Multiple Vitamins-Minerals (MULTIVITAMIN WITH MINERALS) tablet Take 1 tablet by mouth daily. (Patient not taking: Reported on 06/08/2024)   pravastatin  (PRAVACHOL ) 40 MG tablet TAKE 1 TABLET EVERY DAY (OBTAIN FURTHER REFILLS FROM PRIMARY CARE PROVIDER) (Patient not taking: Reported on 06/11/2024)   No facility-administered encounter medications on file as of 06/11/2024.     Patient Active Problem List   Diagnosis Date Noted   OAB (overactive bladder) 06/12/2023   History of shingles 04/14/2022   Stage 3a chronic kidney disease (HCC) 04/09/2021   Persistent atrial fibrillation (HCC) 08/02/2019   GERD (gastroesophageal reflux disease) 07/05/2013   HIV disease (HCC) 05/24/2013    Hypertension associated with diabetes (HCC) 01/25/2013   ASHD (arteriosclerotic heart disease) 03/18/2011   BPH (benign prostatic hyperplasia) 03/18/2011   Hyperlipidemia associated with type 2 diabetes mellitus (HCC) 03/18/2011   Type 2 diabetes mellitus in remission (HCC) 03/18/2011     Health Maintenance Due  Topic Date Due   Zoster Vaccines- Shingrix (1 of 2) 08/17/1961   OPHTHALMOLOGY EXAM  06/08/2016   Diabetic kidney evaluation - Urine ACR  08/11/2016   FOOT EXAM  04/09/2022   COVID-19 Vaccine (4 - 2024-25 season) 06/19/2023   Medicare Annual Wellness (AWV)  05/30/2024   INFLUENZA VACCINE  05/18/2024     Review of Systems +dysphagia, poor po intake, #30 weight loss in 3 months Physical Exam   BP (!) 87/62 Comment: provider notified  Pulse (!) 106   Temp (!) 97.4 F (36.3 C) (Temporal)   Physical Exam  Constitutional: He is oriented to person, place, and time. He appears well-developed and well-nourished. No distress.  HENT:  Mouth/Throat: Oropharynx is clear and moist. No oropharyngeal exudate.  Cardiovascular: Normal rate, regular rhythm and normal heart sounds. Exam reveals no gallop and no friction rub.  No murmur heard.  Pulmonary/Chest: Effort normal and breath sounds normal. No respiratory distress. He has no wheezes.  Abdominal: Soft. Bowel sounds are normal. He exhibits no distension. There is no tenderness.  Lymphadenopathy:  He has no cervical adenopathy.  Neurological: He is alert and oriented to person, place, and time.  Skin: Skin  is warm and dry. No rash noted. No erythema.  Psychiatric: He has a normal mood and affect. His behavior is normal.   Lab Results  Component Value Date   CD4TCELL 41 03/07/2024   Lab Results  Component Value Date   CD4TABS 437 03/17/2023   CD4TABS 513 02/22/2022   CD4TABS 459 03/23/2021   Lab Results  Component Value Date   HIV1RNAQUANT NOT DETECTED 03/07/2024   Lab Results  Component Value Date   HEPBSAB  NON-REACTIVE 06/07/2024   Lab Results  Component Value Date   LABRPR NON-REACTIVE 03/17/2023    CBC Lab Results  Component Value Date   WBC 7.0 05/30/2024   RBC 4.84 05/30/2024   HGB 14.6 05/30/2024   HCT 43.8 05/30/2024   PLT 59 (LL) 05/30/2024   MCV 91 05/30/2024   MCH 30.2 05/30/2024   MCHC 33.3 05/30/2024   RDW 20.0 (H) 05/30/2024   LYMPHSABS 1.7 05/30/2024   MONOABS 0.6 04/26/2024   EOSABS 0.1 05/30/2024    BMET Lab Results  Component Value Date   NA 148 (H) 05/30/2024   K 3.4 (L) 05/30/2024   CL 98 05/30/2024   CO2 26 05/30/2024   GLUCOSE 135 (H) 05/30/2024   BUN 26 05/30/2024   CREATININE 1.88 (H) 05/30/2024   CALCIUM  8.8 05/30/2024   GFRNONAA 43 (L) 04/26/2024   GFRAA 47 (L) 03/23/2021      Assessment and Plan Hypotension= Sending to the ED for stabilization of BP and then admission for dysphagia, unintentional weight loss, failure to thrive with transaminitis concern for malignancy Needs admisison to stabilize, expedite work and up and findings would then discuss goals of care Hiv disease =well controlled  Transaminitis = will need U/S to if having obstruction. Review meds that   Unintentional weight loss = has had ongoing dysphagia, lack of appetite, failure to thrive. Still concern for severe protein-calorie malnutrition, and the underlying casue

## 2024-06-11 NOTE — ED Notes (Signed)
 CCMD called, pt on montor

## 2024-06-11 NOTE — Progress Notes (Signed)
 Transported patient to [facility] ED via wheelchair for hypotension and FTT. Accompanied by Dr. Luiz and patient's spouse, [name]. Checked patient in and transferred care to triage RN. Patient was in no acute distress at time of transfer of care.   Rogelio Winbush, BSN, RN

## 2024-06-11 NOTE — H&P (Signed)
 History and Physical    Patient: Jeremy Reeves FMW:[id number] DOB: 28-Mar-1941 DOA: 06/11/2024 DOS: the patient was seen and examined on 06/11/2024 . PCP: [name], MD  Patient coming from: ID Office.  Chief complaint: Chief Complaint  Patient presents with   Hypotension   HPI:  Jeremy Reeves is a 83 y.o. male with past medical history  of  hypertension, hyperlipidemia, ischemic coronary artery disease s/p 3 vessel CABG 1999, atrial fibrillation on Eliquis , diabetes mellitus type 2, CKD stage IIIa, history of difficult intubation in 1999, HIV positive, cognitive impairment, BPH, thrombocytopenia, GERD and a rectal polyp , sent to the ED from his infectious disease doctor's office for hypotension and failure to thrive, sent for weight loss , hypotension and On initial presentation patient was hypotensive tachycardic no distress oriented x 3, with reports of dysphagia and recent intentional 30 pound weight loss over the past few weeks.called wife and no answer, no family at bedside. Pt is awake but not oriented to self place or time.  Chart review shows patient was seen by GI on June 07, 2024 that mentioned that patient is having difficulty swallowing and holds food in his mouth and then swallows has been using Glucerna and a soft diet.  In light of his cognitive impairment he was deemed that the patient would not be able to tolerate MRI and MRCP, with similar recommendations for endoscopy due to patient's multiple comorbidities.  ED Course:  Vital signs in the ED were notable for the following:  Vitals:   06/11/24 1627 06/11/24 1630 06/11/24 1700 06/11/24 1730  BP: 104/77 107/85 100/80 105/80  Pulse: (!) 105 (!) 106 (!) 103 (!) 104  Temp: (!) 97.5 F (36.4 C)     Resp: 19 11 11 18   SpO2: 100% 100% 100% 100%  TempSrc: Oral     >>ED evaluation thus far shows: CMP shows glucose 132 BUN of 28 creatinine 1.72 EGFR 39, alk phos elevated at 363 AST of 131 ALT of 80 total bili of  4.2. Troponin of 48 and 41 on repeat. Lactic acid of 3.2 with repeat at 2.5. CBC with a normal white count of 3.4 hemoglobin of 16.7 and platelets of 53.  >>While in the ED patient received the following: Medications  sodium chloride  0.9 % bolus 1,000 mL (1,000 mLs Intravenous New Bag/Given 06/11/24 1254)  iohexol  (OMNIPAQUE ) 350 MG/ML injection 80 mL (80 mLs Intravenous Contrast Given 06/11/24 1409)   Review of Systems  Unable to perform ROS: Dementia   Past Medical History:  Diagnosis Date   ASHD (arteriosclerotic heart disease)    BPH (benign prostatic hyperplasia)    Difficult intubation    anesthesia record 1999 / record on chart   Foley catheter in place 10/23/12   GERD (gastroesophageal reflux disease)    HH (hiatus hernia)    Hiatal hernia    HIV (human immunodeficiency virus infection) (HCC) dx'd 2014   Hyperlipidemia    Hypertension    Shingles 8/14   Type II diabetes mellitus (HCC)    pre diabetes   Past Surgical History:  Procedure Laterality Date   CARDIAC CATHETERIZATION  05/02/1998   Recommend CABG   CARDIOVASCULAR STRESS TEST  12/01/2011   Perfusion defect consistent with diaphragmatic attenuation. There is no scintigraphic of inducible myocardial ischemia.    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 LIMA to the LAD, vein to obtuse marginal, and vein to RCA   IRRIGATION  AND DEBRIDEMENT ABSCESS Right 12/04/2015   Procedure: MINOR INCISION AND DRAINAGE OF ABSCESS;  Surgeon: Franky Curia, MD;  Location: [facility];  Service: Orthopedics;  Laterality: Right;   TRANSTHORACIC ECHOCARDIOGRAM  03/29/2011   EF ~50%, mild-moderate inferior wall hypokinesis   TRANSURETHRAL RESECTION OF PROSTATE N/A 10/25/2013   Procedure: TRANSURETHRAL RESECTION OF THE PROSTATE WITH GYRUS BUTTON;  Surgeon: Norleen Seltzer, MD;  Location: [facility];  Service: Urology;  Laterality: N/A;    reports that he has quit smoking. His smoking use included cigarettes. He  has a 4.8 pack-year smoking history. He has never used smokeless tobacco. He reports that he does not drink alcohol and does not use drugs. Allergies  Allergen Reactions   Peanut-Containing Drug Products Swelling   Family History  Problem Relation Age of Onset   Kidney disease Mother    Heart disease Father    Hypertension Brother    Liver disease Neg Hx    Cancer - Colon Neg Hx    Esophageal cancer Neg Hx    Prior to Admission medications   Medication Sig Start Date End Date Taking? Authorizing Provider  carvedilol  (COREG ) 6.25 MG tablet TAKE 1 TABLET TWICE DAILY WITH MEALS 05/16/24   Lalonde, Jamal C, MD  Cinnamon 500 MG capsule Take 1,000 mg by mouth daily.    [provider]  Cranberry 360 MG CAPS Take by mouth.    [provider]  cyanocobalamin (VITAMIN B12) 1000 MCG tablet Take 1,000 mcg by mouth daily.    [provider]  dolutegravir -lamiVUDine  (DOVATO ) 50-300 MG tablet Take 1 tablet by mouth daily. 11/02/23   Luiz Channel, MD  ELIQUIS  2.5 MG TABS tablet TAKE 1 TABLET TWICE DAILY 10/03/23   Court Dorn PARAS, MD  Iron-Vitamins (GERITOL TONIC PO) Take 1 Capful by mouth daily at 12 noon.    [provider]  ketoconazole  (NIZORAL ) 2 % cream Apply 1 Application topically daily. Apply 1gm between affected toes once daily 11/21/23   McCaughan, Dia D, DPM  mirabegron  ER (MYRBETRIQ ) 25 MG TB24 tablet Take 1 tablet (25 mg total) by mouth daily. 09/06/22   Lalonde, Taro C, MD  Multiple Vitamins-Minerals (MULTIVITAMIN WITH MINERALS) tablet Take 1 tablet by mouth daily. Patient not taking: Reported on 06/08/2024    [provider]  niacin  (NIASPAN ) 1000 MG CR tablet TAKE 2 TABLETS AT BEDTIME 11/16/23   Court Dorn PARAS, MD  omeprazole  (PRILOSEC) 20 MG capsule TAKE 1 CAPSULE EVERY DAY 05/04/24   Court Dorn PARAS, MD  pravastatin  (PRAVACHOL ) 40 MG tablet TAKE 1 TABLET EVERY DAY (OBTAIN FURTHER REFILLS FROM PRIMARY CARE PROVIDER) Patient not taking:  Reported on 06/11/2024 04/09/24   [name], MD  vitamin E 400 UNIT capsule Take 400 Units by mouth daily.    [provider]                                                                                 Vitals:   06/11/24 1627 06/11/24 1630 06/11/24 1700 06/11/24 1730  BP: 104/77 107/85 100/80 105/80  Pulse: (!) 105 (!) 106 (!) 103 (!) 104  Resp: 19 11 11 18   Temp: (!) 97.5  F (36.4 C)     TempSrc: Oral     SpO2: 100% 100% 100% 100%   Physical Exam Vitals reviewed.  Constitutional:      General: He is not in acute distress.    Appearance: Normal appearance. He is not ill-appearing.  HENT:     Head: Normocephalic and atraumatic.     Right Ear: External ear normal.     Left Ear: External ear normal.  Eyes:     Extraocular Movements: Extraocular movements intact.  Cardiovascular:     Rate and Rhythm: Normal rate and regular rhythm.     Pulses: Normal pulses.     Heart sounds: Normal heart sounds.  Pulmonary:     Effort: Pulmonary effort is normal.     Breath sounds: Normal breath sounds.  Abdominal:     General: There is no distension.     Palpations: Abdomen is soft.     Tenderness: There is no abdominal tenderness.  Neurological:     General: No focal deficit present.     Mental Status: He is alert. He is disoriented.     Cranial Nerves: No cranial nerve deficit.     Labs on Admission: I have personally reviewed following labs and imaging studies CBC: Recent Labs  Lab 06/11/24 1215  WBC 3.4*  NEUTROABS 1.6*  HGB 16.7  HCT 50.6  MCV 93.2  PLT 53*   Basic Metabolic Panel: Recent Labs  Lab 06/11/24 1215  NA 144  K 4.2  CL 103  CO2 29  GLUCOSE 132*  BUN 28*  CREATININE 1.72*  CALCIUM  8.6*   GFR: Estimated Creatinine Clearance: 25.3 mL/min (A) (by C-G formula based on SCr of 1.72 mg/dL (H)). Liver Function Tests: Recent Labs  Lab 06/11/24 1215  AST 131*  ALT 80*  ALKPHOS 363*  BILITOT 4.2*  PROT 6.8  ALBUMIN 3.2*   Recent Labs   Lab 06/11/24 1215  LIPASE 43   No results for input(s): AMMONIA in the last 168 hours. Recent Labs    11/02/23 1125 02/21/24 1050 04/26/24 2319 05/09/24 1502 05/30/24 1533 06/11/24 1215  BUN 16 16 20 17 26  28*  CREATININE 1.36* 1.51* 1.60* 1.27 1.88* 1.72*    Estimated Creatinine Clearance: 25.3 mL/min (A) (by C-G formula based on SCr of 1.72 mg/dL (H)).   Recent Labs    11/02/23 1125 02/21/24 1050 04/26/24 2319 05/09/24 1502 05/30/24 1533 06/11/24 1215  BUN 16 16 20 17 26  28*  CREATININE 1.36* 1.51* 1.60* 1.27 1.88* 1.72*  CO2 31 26 28 28 26 29    Cardiac Enzymes: No results for input(s): CKTOTAL, CKMB, CKMBINDEX, TROPONINI in the last 168 hours. BNP (last 3 results) No results for input(s): PROBNP in the last 8760 hours. HbA1C: No results for input(s): HGBA1C in the last 72 hours. CBG: No results for input(s): GLUCAP in the last 168 hours. Lipid Profile: No results for input(s): CHOL, HDL, LDLCALC, TRIG, CHOLHDL, LDLDIRECT in the last 72 hours. Thyroid  Function Tests: No results for input(s): TSH, T4TOTAL, FREET4, T3FREE, THYROIDAB in the last 72 hours. Anemia Panel: No results for input(s): VITAMINB12, FOLATE, FERRITIN, TIBC, IRON, RETICCTPCT in the last 72 hours. Urine analysis:    Component Value Date/Time   COLORURINE AMBER (A) 04/27/2024 0300   APPEARANCEUR HAZY (A) 04/27/2024 0300   LABSPEC 1.020 04/27/2024 0300   LABSPEC 1.020 08/18/2022 1559   PHURINE 6.0 04/27/2024 0300   GLUCOSEU NEGATIVE 04/27/2024 0300   HGBUR NEGATIVE 04/27/2024 0300   BILIRUBINUR NEGATIVE  04/27/2024 0300   BILIRUBINUR negative 08/18/2022 1559   BILIRUBINUR NEG 06/25/2013 1111   KETONESUR 5 (A) 04/27/2024 0300   PROTEINUR 30 (A) 04/27/2024 0300   UROBILINOGEN 0.2 07/31/2014 1029   NITRITE NEGATIVE 04/27/2024 0300   LEUKOCYTESUR NEGATIVE 04/27/2024 0300   Radiological Exams on Admission: CT CHEST ABDOMEN PELVIS W  CONTRAST Result Date: 06/11/2024 CLINICAL DATA:  Abdominal pain, dysphagia. EXAM: CT CHEST, ABDOMEN, AND PELVIS WITH CONTRAST TECHNIQUE: Multidetector CT imaging of the chest, abdomen and pelvis was performed following the standard protocol during bolus administration of intravenous contrast. RADIATION DOSE REDUCTION: This exam was performed according to the departmental dose-optimization program which includes automated exposure control, adjustment of the mA and/or kV according to patient size and/or use of iterative reconstruction technique. CONTRAST:  80mL OMNIPAQUE  IOHEXOL  350 MG/ML SOLN COMPARISON:  None Available. FINDINGS: CT CHEST FINDINGS Cardiovascular: Atherosclerotic calcification of the aorta, aortic valve and coronary arteries. Heart is at the upper limits of normal in size to mildly enlarged. No pericardial effusion. Mediastinum/Nodes: No pathologically enlarged mediastinal, hilar or axillary lymph nodes. Esophagus is grossly unremarkable. Lungs/Pleura: Lungs are clear. No pleural fluid. Airway is unremarkable. Musculoskeletal: Degenerative changes in the spine. CT ABDOMEN PELVIS FINDINGS Hepatobiliary: Image quality is degraded by streak artifact from the patient's arms. Liver and gallbladder are grossly unremarkable. Pancreas: Grossly unremarkable. Spleen: Grossly unremarkable. Adrenals/Urinary Tract: Adrenal glands are unremarkable. Small low-attenuation lesions in the kidneys. No specific follow-up necessary. Small bilateral renal stones. Ureters are decompressed. Bladder is relatively low in volume. Stomach/Bowel: Stomach, small bowel, appendix and colon are unremarkable. Vascular/Lymphatic: Atherosclerotic calcification of the aorta. 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 No pathologically enlarged lymph nodes. Reproductive: Prostate is minimally prominent. Other: No free fluid. Musculoskeletal: Osteopenia.  Degenerative changes in the spine. IMPRESSION: 1. No acute  findings to explain the patient's symptoms. 2. Bilateral renal stones. 3. Mildly prominent prostate. 4. Aortic atherosclerosis (ICD10-I70.0). Coronary artery calcification. 5. 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 Electronically Signed   By: Newell Eke M.D.   On: 06/11/2024 14:59   CT Soft Tissue Neck W Contrast Result Date: 06/11/2024 EXAM: CT NECK WITH CONTRAST 06/11/2024 02:08:36 PM TECHNIQUE: CT of the neck was performed with the administration of intravenous contrast. Multiplanar reformatted images are provided for review. Automated exposure control, iterative reconstruction, and/or weight based adjustment of the mA/kV was utilized to reduce the radiation dose to as low as reasonably achievable. COMPARISON: None available. CLINICAL HISTORY: Dysphagia. FINDINGS: AERODIGESTIVE TRACT: No discrete mass. No edema. SALIVARY GLANDS: The parotid and submandibular glands are unremarkable. THYROID : Unremarkable. LYMPH NODES: No suspicious cervical lymphadenopathy. SOFT TISSUES: 3.5 x 2.8 cm lipoma in the left lateral suboccipital region. BRAIN, ORBITS, SINUSES AND MASTOIDS: Partially visualized bilateral temporal lobe volume loss. Unremarkable orbits. Paranasal sinuses and mastoid air cells are clear. LUNGS AND MEDIASTINUM: More fully evaluated on today's separately reported CT of the chest, abdomen, and pelvis. BONES: No destructive bone lesion. Multilevel cervical disc degeneration, most advanced at C3-4 and C5-6. VASCULATURE: Moderate atherosclerosis at the carotid bifurcations without evidence of a high-grade stenosis. IMPRESSION: 1. No acute abnormality or suspicious neck mass. 2. Lipoma in the posterior left upper neck. Electronically signed by: Dasie Hamburg MD 06/11/2024 02:48 PM EDT RP Workstation: [id number]   DG Chest Portable 1 View Result Date: 06/11/2024 CLINICAL DATA:  Weakness EXAM: PORTABLE CHEST 1 VIEW COMPARISON:  04/26/2024 FINDINGS: Prior CABG. Heart and mediastinal contours  are within normal limits. No focal opacities or  effusions. No acute bony abnormality. No pneumothorax. IMPRESSION: No active disease. Electronically Signed   By: Franky Crease M.D.   On: 06/11/2024 14:11   Data Reviewed: Relevant notes from primary care and specialist visits, past discharge summaries as available in EHR, including Care Everywhere . Prior diagnostic testing as pertinent to current admission diagnoses, Updated medications and problem lists for reconciliation .ED course, including vitals, labs, imaging, treatment and response to treatment,Triage notes, nursing and pharmacy notes and ED provider's notes.Notable results as noted in HPI.Discussed case with EDMD/ ED APP/ or Specialty MD on call and as needed.  Assessment & Plan  >> Failure to thrive: Due to dysphagia per report, GI no describes that patient has been pocketing food and this may be related to his advanced dementia and progressive decline in p.o. intake.  Agree with referral for palliative care measures. GI consult per am team.    >>Dysphagia: PT has OP dysphagia and not a candidate for EGD or MRCP.    >> Unintentional weight loss: Secondary to decreased p.o. intake.   >> HIV positive on Dovato : Will get speech evaluation for patient swallow reflex to see if he is appropriate for swallowing his home meds including his Dovato    >> A-fib on Eliquis : Await speech evaluation for risk of aspiration and dysphagia evaluation.   >>DM II: Suspect resolved. We will poct q4 hours.   >>AKI: Lab Results  Component Value Date   CREATININE 1.72 (H) 06/11/2024   CREATININE 1.88 (H) 05/30/2024   CREATININE 1.27 05/09/2024  Cont with IVF hydration.   >>Abnormal LFT: Suspect elevated alk phos from with biliary or bone source from osteopenia or osteoporosis.  We will continue to follow.    DVT prophylaxis:  Eliquis  Consults:  None  Advance Care Planning:    Code Status: Full Code   Family Communication:   None Disposition Plan:  Home Severity of Illness: The appropriate patient status for this patient is OBSERVATION. Observation status is judged to be reasonable and necessary in order to provide the required intensity of service to ensure the patient's safety. The patient's presenting symptoms, physical exam findings, and initial radiographic and laboratory data in the context of their medical condition is felt to place them at decreased risk for further clinical deterioration. Furthermore, it is anticipated that the patient will be medically stable for discharge from the hospital within 2 midnights of admission.   Unresulted Labs (From admission, onward)     Start     Ordered   06/12/24 0500  Comprehensive metabolic panel  Tomorrow morning,   R        06/11/24 1827   06/12/24 0500  CBC  Tomorrow morning,   R        06/11/24 1827   06/11/24 1236  Urinalysis, Routine w reflex microscopic -Urine, Clean Catch  Once,   URGENT       Question:  Specimen Source  Answer:  Urine, Clean Catch   06/11/24 1236   06/11/24 1223  Blood culture (routine x 2)  BLOOD CULTURE X 2,   R      06/11/24 1223            Meds ordered this encounter  Medications   sodium chloride  0.9 % bolus 1,000 mL   iohexol  (OMNIPAQUE ) 350 MG/ML injection 80 mL   DISCONTD: potassium chloride  SA (KLOR-CON  M) CR tablet 40 mEq   sodium chloride  flush (NS) 0.9 % injection 3 mL   0.9 %  sodium  chloride infusion   OR Linked Order Group    acetaminophen  (TYLENOL ) tablet 650 mg    acetaminophen  (TYLENOL ) suppository 650 mg   morphine  (PF) 2 MG/ML injection 1 mg     Orders Placed This Encounter  Procedures   Blood culture (routine x 2)   DG Chest Portable 1 View   CT CHEST ABDOMEN PELVIS W CONTRAST   CT Soft Tissue Neck W Contrast   Comprehensive metabolic panel   CBC with Differential   Lipase, blood   Urinalysis, Routine w reflex microscopic -Urine, Clean Catch   Gamma GT   Ethanol   Comprehensive metabolic panel    CBC   DIET - DYS 1 Room service appropriate? Yes; Fluid consistency: Thin   ED Cardiac monitoring   Maintain IV access   Vital signs   Notify physician (specify)   Mobility Protocol: No Restrictions   Refer to Sidebar Report Mobility Protocol for Adult Inpatient   Initiate Adult Central Line Maintenance and Catheter Protocol for patients with central line (CVC, PICC, Port, Hemodialysis, Trialysis)   Daily weights   Intake and Output   Initiate CHG Protocol   Do not place and if present remove PureWick   Initiate Oral Care Protocol   Initiate Carrier Fluid Protocol   RN may order General Admission PRN Orders utilizing General Admission PRN medications (through manage orders) for the following patient needs: allergy symptoms (Claritin), cold sores (Carmex), cough (Robitussin DM), eye irritation (Liquifilm Tears), hemorrhoids (Tucks), indigestion (Maalox), minor skin irritation (Hydrocortisone Cream), muscle pain Lucienne Gay), nose irritation (saline nasal spray) and sore throat (Chloraseptic spray).   Cardiac Monitoring Continuous x 24 hours Indications for use: Other; other indications for use: electrolyte abnormality.   Neuro checks   Full code   Consult for Unassigned Medical Admission   Consult to hospitalist   Pulse oximetry check with vital signs   Oxygen therapy Mode or (Route): Nasal cannula; Liters Per Minute: 2; Keep O2 saturation between: greater than 92 %   SLP eval and treat Reason for evaluation: .Swallowing evaluation (BSE, MBS and/or diet order as indicated), Modified Barium Swallow Study and diet order as indicated   I-Stat Lactic Acid, ED   I-Stat CG4 Lactic Acid   EKG 12-Lead   Place in observation (patient's expected length of stay will be less than 2 midnights)   Aspiration precautions   Fall precautions    Author: Mario LULLA Blanch, MD 12 pm- 8 pm. [facility]. 06/11/2024 7:02 PM Please note for any communication after hours contact [facility] Assigned provider on  call on Amion.

## 2024-06-11 NOTE — ED Provider Notes (Signed)
 [place] EMERGENCY DEPARTMENT AT [facility] Provider Note  CSN: [id number] Arrival date & time: 06/11/24 1149  Chief Complaint(s) Hypotension  HPI Jeremy Reeves is a 83 y.o. male with past medical history of HIV, hypertension, hyperlipidemia, and type 2 diabetes who was sent to the ED from his infectious disease doctor's office for hypotension and failure to thrive. On arrival, his BP was 83/42, HR 106, T 97.56F. He was not in acute distress but appeared cachectic with temporal wasting. He was oriented 3, though per his wife he has been acting slower than usual. She reports progressive dysphagia over the past several weeks, requiring a pureed diet, and poor oral intake with a 30-pound weight loss. She denies fever, chills, nausea, vomiting, diarrhea, cough, chest pain, shortness of breath, or urinary complaints. No recent sick contacts.   Past Medical History Past Medical History:  Diagnosis Date   ASHD (arteriosclerotic heart disease)    BPH (benign prostatic hyperplasia)    Difficult intubation    anesthesia record 1999 / record on chart   Foley catheter in place 10/23/12   GERD (gastroesophageal reflux disease)    HH (hiatus hernia)    Hiatal hernia    HIV (human immunodeficiency virus infection) (HCC) dx'd 2014   Hyperlipidemia    Hypertension    Shingles 8/14   Type II diabetes mellitus (HCC)    pre diabetes   Patient Active Problem List   Diagnosis Date Noted   OAB (overactive bladder) 06/12/2023   History of shingles 04/14/2022   Stage 3a chronic kidney disease (HCC) 04/09/2021   Persistent atrial fibrillation (HCC) 08/02/2019   GERD (gastroesophageal reflux disease) 07/05/2013   HIV disease (HCC) 05/24/2013   Hypertension associated with diabetes (HCC) 01/25/2013   ASHD (arteriosclerotic heart disease) 03/18/2011   BPH (benign prostatic hyperplasia) 03/18/2011   Hyperlipidemia associated with type 2 diabetes mellitus (HCC) 03/18/2011   Type 2 diabetes  mellitus in remission (HCC) 03/18/2011   Home Medication(s) Prior to Admission medications   Medication Sig Start Date End Date Taking? Authorizing Provider  carvedilol  (COREG ) 6.25 MG tablet TAKE 1 TABLET TWICE DAILY WITH MEALS 05/16/24   Lalonde, Price C, MD  Cinnamon 500 MG capsule Take 1,000 mg by mouth daily.    [provider]  Cranberry 360 MG CAPS Take by mouth.    [provider]  cyanocobalamin (VITAMIN B12) 1000 MCG tablet Take 1,000 mcg by mouth daily.    [provider]  dolutegravir -lamiVUDine  (DOVATO ) 50-300 MG tablet Take 1 tablet by mouth daily. 11/02/23   Luiz Channel, MD  ELIQUIS  2.5 MG TABS tablet TAKE 1 TABLET TWICE DAILY 10/03/23   Court Dorn PARAS, MD  Iron-Vitamins (GERITOL TONIC PO) Take 1 Capful by mouth daily at 12 noon.    [provider]  ketoconazole  (NIZORAL ) 2 % cream Apply 1 Application topically daily. Apply 1gm between affected toes once daily 11/21/23   McCaughan, Dia D, DPM  mirabegron  ER (MYRBETRIQ ) 25 MG TB24 tablet Take 1 tablet (25 mg total) by mouth daily. 09/06/22   Lalonde, Draylon C, MD  Multiple Vitamins-Minerals (MULTIVITAMIN WITH MINERALS) tablet Take 1 tablet by mouth daily. Patient not taking: Reported on 06/08/2024    [provider]  niacin  (NIASPAN ) 1000 MG CR tablet TAKE 2 TABLETS AT BEDTIME 11/16/23   Court Dorn PARAS, MD  omeprazole  (PRILOSEC) 20 MG capsule TAKE 1 CAPSULE EVERY DAY 05/04/24   Court Dorn PARAS, MD  pravastatin  (PRAVACHOL ) 40 MG tablet TAKE 1  TABLET EVERY DAY (OBTAIN FURTHER REFILLS FROM PRIMARY CARE PROVIDER) Patient not taking: Reported on 06/11/2024 04/09/24   [name], MD  vitamin E 400 UNIT capsule Take 400 Units by mouth daily.    [provider]                                                                                                                                    Past Surgical History Past Surgical History:  Procedure Laterality Date   CARDIAC  CATHETERIZATION  05/02/1998   Recommend CABG   CARDIOVASCULAR STRESS TEST  12/01/2011   Perfusion defect consistent with diaphragmatic attenuation. There is no scintigraphic of inducible myocardial ischemia.    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 LIMA to the LAD, vein to obtuse marginal, and vein to RCA   IRRIGATION AND DEBRIDEMENT ABSCESS Right 12/04/2015   Procedure: MINOR INCISION AND DRAINAGE OF ABSCESS;  Surgeon: Franky Curia, MD;  Location: [facility];  Service: Orthopedics;  Laterality: Right;   TRANSTHORACIC ECHOCARDIOGRAM  03/29/2011   EF ~50%, mild-moderate inferior wall hypokinesis   TRANSURETHRAL RESECTION OF PROSTATE N/A 10/25/2013   Procedure: TRANSURETHRAL RESECTION OF THE PROSTATE WITH GYRUS BUTTON;  Surgeon: Norleen Seltzer, MD;  Location: [facility];  Service: Urology;  Laterality: N/A;   Family History Family History  Problem Relation Age of Onset   Kidney disease Mother    Heart disease Father    Hypertension Brother    Liver disease Neg Hx    Cancer - Colon Neg Hx    Esophageal cancer Neg Hx     Social History Social History   Tobacco Use   Smoking status: Former    Current packs/day: 0.12    Average packs/day: 0.1 packs/day for 40.0 years (4.8 ttl pk-yrs)    Types: Cigarettes   Smokeless tobacco: Never   Tobacco comments:    07/04/2013 I quit smoking before 2000  Vaping Use   Vaping status: Never Used  Substance Use Topics   Alcohol use: No   Drug use: No   Allergies Peanut-containing drug products  Review of Systems A thorough review of systems was obtained and all systems are negative except as noted in the HPI and PMH.   Physical Exam Vital Signs  I have reviewed the triage vital signs BP 106/78   Pulse (!) 104   Resp (!) 24   SpO2 100%  General, in no acute acute cystitis, cachectic with temporal wasting Cardiac: Regular rate and rhythm, normal S1 and S2 Lungs: Clear to auscultation bilaterally in all  fields, with no wheezes or crackles appreciated. Normal respiratory effort. Abdomen: Non-distended, soft, non-tender. Extremities: Warm and well-perfused Neurological: oriented 4 but slow in answering, mild confusion, Cranial nerves II-XII grossly intact. No focal deficits. Skin: No rashes noted.  ED Results and Treatments Labs (all labs ordered are listed, but only abnormal  results are displayed) Labs Reviewed  COMPREHENSIVE METABOLIC PANEL WITH GFR - Abnormal; Notable for the following components:      Result Value   Glucose, Bld 132 (*)    BUN 28 (*)    Creatinine, Ser 1.72 (*)    Calcium  8.6 (*)    Albumin 3.2 (*)    AST 131 (*)    ALT 80 (*)    Alkaline Phosphatase 363 (*)    Total Bilirubin 4.2 (*)    GFR, Estimated 39 (*)    All other components within normal limits  CBC WITH DIFFERENTIAL/PLATELET - Abnormal; Notable for the following components:   WBC 3.4 (*)    RDW 20.6 (*)    Platelets 53 (*)    nRBC 0.6 (*)    Neutro Abs 1.6 (*)    All other components within normal limits  I-STAT CG4 LACTIC ACID, ED - Abnormal; Notable for the following components:   Lactic Acid, Venous 3.2 (*)    All other components within normal limits  TROPONIN I (HIGH SENSITIVITY) - Abnormal; Notable for the following components:   Troponin I (High Sensitivity) 48 (*)    All other components within normal limits  CULTURE, BLOOD (ROUTINE X 2)  CULTURE, BLOOD (ROUTINE X 2)  LIPASE, BLOOD  URINALYSIS, ROUTINE W REFLEX MICROSCOPIC  I-STAT CG4 LACTIC ACID, ED  TROPONIN I (HIGH SENSITIVITY)                                                                                                                          Radiology DG Chest Portable 1 View Result Date: 06/11/2024 CLINICAL DATA:  Weakness EXAM: PORTABLE CHEST 1 VIEW COMPARISON:  04/26/2024 FINDINGS: Prior CABG. Heart and mediastinal contours are within normal limits. No focal opacities or effusions. No acute bony abnormality. No  pneumothorax. IMPRESSION: No active disease. Electronically Signed   By: Franky Crease M.D.   On: 06/11/2024 14:11    Pertinent labs & imaging results that were available during my care of the patient were reviewed by me and considered in my medical decision making (see MDM for details).  Medications Ordered in ED Medications  sodium chloride  0.9 % bolus 1,000 mL (1,000 mLs Intravenous New Bag/Given 06/11/24 1254)  iohexol  (OMNIPAQUE ) 350 MG/ML injection 80 mL (80 mLs Intravenous Contrast Given 06/11/24 1409)  Procedures Procedures  (including critical care time)  Medical Decision Making / ED Course  Medical Decision Making:   SAMPSON SELF  is an 83 year old male with past medical history of HIV, hypertension, hyperlipidemia, and type 2 diabetes who was sent to the ED from his infectious disease doctor's office for hypotension and failure to thrive. On arrival, his BP was 83/42, HR 106, T 97.2F. He was not in acute distress but appeared cachectic with temporal wasting. He was oriented 3, though per his wife he has been acting slower than usual. She reports progressive dysphagia over the past several weeks, requiring a pureed diet, and poor oral intake with a 30-pound weight loss. She denies fever, chills, nausea, vomiting, diarrhea, cough, chest pain, shortness of breath, or urinary complaints. No recent sick contacts. On exam, patient was cachectic, with temporal wasting, mildly confused but responsive and following commands. Strength was intact, no focal neurologic deficits. Mucous membranes dry. No respiratory distress, chest clear, abdomen soft and non-tender.  #Hypotension Presented with hypotension (83/42) and tachycardia (106). No fever or leukocytosis reported, lowering suspicion for overt sepsis, though given HIV status, immunosuppression increases  infection risk. Patient's history of poor oral intake and significant weight loss suggests dehydration and malnutrition as contributing factors. No localizing infectious symptoms from history. Plan: -IV fluid resuscitation -CBC: WBC:3.2, Plt: 53 -CMP -lactic acid: 3.2 -UA -blood cultures: pending -EKG   #Failure to Thrive / Severe Weight Loss History of 30 lb unintentional weight loss with progressive dysphagia, cachexia, and temporal wasting. Concerning for advanced malnutrition, possible opportunistic infection, esophageal pathology (e.g., candidiasis, stricture, malignancy), or HIV-related complications. 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 Mild esophageal dysmotility. 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 -chest abdomen pelvic CT scan for ruling out any malignancy: 1. No acute findings to explain the patient's symptoms. 2. Bilateral renal stones. -Neck soft tissue CT: No acute abnormality or suspicious neck mass.  -consider GI consultation for dysphagia evaluation (EGD).  #Mild Confusion / Altered Mental Status Per wife, patient is "slower than usual." On exam, oriented 3 but with slowed responses. Possible contributors: dehydration, malnutrition, metabolic abnormalities, hypotension, infection, or HIV-related neurocognitive decline. Plan:  Check electrolytes, glucose, renal/liver function, HIV labs, consider head CT if worsening or focal deficits. Monitor mentation after hydration.  Additional history obtained: -Additional history obtained from spouse -External records from outside source obtained and reviewed including: Chart review including previous notes, labs, imaging, consultation notes including    Lab  Tests: -I ordered, reviewed, and interpreted labs.   The pertinent results include:   Labs Reviewed  COMPREHENSIVE METABOLIC PANEL WITH GFR - Abnormal; Notable for the following components:      Result Value   Glucose, Bld 132 (*)    BUN 28 (*)    Creatinine, Ser 1.72 (*)    Calcium  8.6 (*)    Albumin 3.2 (*)    AST 131 (*)    ALT 80 (*)    Alkaline Phosphatase 363 (*)    Total Bilirubin 4.2 (*)    GFR, Estimated 39 (*)    All other components within normal limits  CBC WITH DIFFERENTIAL/PLATELET - Abnormal; Notable for the following components:   WBC 3.4 (*)    RDW 20.6 (*)    Platelets 53 (*)    nRBC 0.6 (*)  Neutro Abs 1.6 (*)    All other components within normal limits  I-STAT CG4 LACTIC ACID, ED - Abnormal; Notable for the following components:   Lactic Acid, Venous 3.2 (*)    All other components within normal limits  TROPONIN I (HIGH SENSITIVITY) - Abnormal; Notable for the following components:   Troponin I (High Sensitivity) 48 (*)    All other components within normal limits  CULTURE, BLOOD (ROUTINE X 2)  CULTURE, BLOOD (ROUTINE X 2)  LIPASE, BLOOD  URINALYSIS, ROUTINE W REFLEX MICROSCOPIC  I-STAT CG4 LACTIC ACID, ED  TROPONIN I (HIGH SENSITIVITY)     EKG   EKG Interpretation Date/Time:    Ventricular Rate:    PR Interval:    QRS Duration:    QT Interval:    QTC Calculation:   R Axis:      Text Interpretation:          Imaging Studies ordered: I ordered imaging studies including: Chest Xray, chest abdomen pelvis CT scan with contrast, neck soft tissue CT scan with contrast I independently visualized the following imaging with scope of interpretation limited to determining acute life threatening conditions related to emergency care; findings noted above I agree with the radiologist interpretation If any imaging was obtained with contrast I closely monitored patient for any possible adverse reaction a/w contrast administration in the emergency  department   Medicines ordered and prescription drug management: Meds ordered this encounter  Medications   sodium chloride  0.9 % bolus 1,000 mL   iohexol  (OMNIPAQUE ) 350 MG/ML injection 80 mL   DISCONTD: potassium chloride  SA (KLOR-CON  M) CR tablet 40 mEq    -I have reviewed the patients home medicines and have made adjustments as needed    Cardiac Monitoring: The patient was maintained on a cardiac monitor.  I personally viewed and interpreted the cardiac monitored which showed an underlying rhythm of:  Continuous pulse oximetry interpreted by myself, 97% on room air.    Reevaluation: After the interventions noted above, I reevaluated the patient and found that they have improved  Co morbidities that complicate the patient evaluation  Past Medical History:  Diagnosis Date   ASHD (arteriosclerotic heart disease)    BPH (benign prostatic hyperplasia)    Difficult intubation    anesthesia record 1999 / record on chart   Foley catheter in place 10/23/12   GERD (gastroesophageal reflux disease)    HH (hiatus hernia)    Hiatal hernia    HIV (human immunodeficiency virus infection) (HCC) dx'd 2014   Hyperlipidemia    Hypertension    Shingles 8/14   Type II diabetes mellitus (HCC)    pre diabetes      Dispostion: Disposition decision including need for hospitalization was considered, and patient admitted to the hospital.    Final Clinical Impression(s) / ED Diagnoses Final diagnoses:  None        Bernadine Manos, MD 06/11/24 1711    Elnor Jayson LABOR, DO 06/12/24 [phone]

## 2024-06-12 ENCOUNTER — Ambulatory Visit: Payer: MEDICARE | Admitting: Podiatry

## 2024-06-12 ENCOUNTER — Inpatient Hospital Stay (HOSPITAL_COMMUNITY): Payer: MEDICARE

## 2024-06-12 DIAGNOSIS — E861 Hypovolemia: Secondary | ICD-10-CM | POA: Diagnosis present

## 2024-06-12 DIAGNOSIS — G9341 Metabolic encephalopathy: Secondary | ICD-10-CM | POA: Diagnosis present

## 2024-06-12 DIAGNOSIS — Z515 Encounter for palliative care: Secondary | ICD-10-CM

## 2024-06-12 DIAGNOSIS — R64 Cachexia: Secondary | ICD-10-CM | POA: Diagnosis present

## 2024-06-12 DIAGNOSIS — Z8249 Family history of ischemic heart disease and other diseases of the circulatory system: Secondary | ICD-10-CM | POA: Diagnosis not present

## 2024-06-12 DIAGNOSIS — Z7189 Other specified counseling: Secondary | ICD-10-CM | POA: Diagnosis not present

## 2024-06-12 DIAGNOSIS — N400 Enlarged prostate without lower urinary tract symptoms: Secondary | ICD-10-CM | POA: Diagnosis present

## 2024-06-12 DIAGNOSIS — Z66 Do not resuscitate: Secondary | ICD-10-CM

## 2024-06-12 DIAGNOSIS — E872 Acidosis, unspecified: Secondary | ICD-10-CM | POA: Diagnosis present

## 2024-06-12 DIAGNOSIS — F0390 Unspecified dementia without behavioral disturbance: Secondary | ICD-10-CM | POA: Diagnosis present

## 2024-06-12 DIAGNOSIS — B20 Human immunodeficiency virus [HIV] disease: Secondary | ICD-10-CM | POA: Diagnosis present

## 2024-06-12 DIAGNOSIS — I2510 Atherosclerotic heart disease of native coronary artery without angina pectoris: Secondary | ICD-10-CM | POA: Diagnosis present

## 2024-06-12 DIAGNOSIS — D696 Thrombocytopenia, unspecified: Secondary | ICD-10-CM | POA: Diagnosis present

## 2024-06-12 DIAGNOSIS — Z7901 Long term (current) use of anticoagulants: Secondary | ICD-10-CM | POA: Diagnosis not present

## 2024-06-12 DIAGNOSIS — R627 Adult failure to thrive: Secondary | ICD-10-CM | POA: Diagnosis present

## 2024-06-12 DIAGNOSIS — E785 Hyperlipidemia, unspecified: Secondary | ICD-10-CM | POA: Diagnosis present

## 2024-06-12 DIAGNOSIS — E1159 Type 2 diabetes mellitus with other circulatory complications: Secondary | ICD-10-CM | POA: Diagnosis present

## 2024-06-12 DIAGNOSIS — I482 Chronic atrial fibrillation, unspecified: Secondary | ICD-10-CM | POA: Diagnosis present

## 2024-06-12 DIAGNOSIS — R1310 Dysphagia, unspecified: Secondary | ICD-10-CM | POA: Diagnosis present

## 2024-06-12 DIAGNOSIS — E1122 Type 2 diabetes mellitus with diabetic chronic kidney disease: Secondary | ICD-10-CM | POA: Diagnosis present

## 2024-06-12 DIAGNOSIS — F17210 Nicotine dependence, cigarettes, uncomplicated: Secondary | ICD-10-CM | POA: Diagnosis present

## 2024-06-12 DIAGNOSIS — I152 Hypertension secondary to endocrine disorders: Secondary | ICD-10-CM | POA: Diagnosis present

## 2024-06-12 DIAGNOSIS — N179 Acute kidney failure, unspecified: Secondary | ICD-10-CM | POA: Diagnosis present

## 2024-06-12 DIAGNOSIS — Z681 Body mass index (BMI) 19 or less, adult: Secondary | ICD-10-CM | POA: Diagnosis not present

## 2024-06-12 DIAGNOSIS — N1832 Chronic kidney disease, stage 3b: Secondary | ICD-10-CM | POA: Diagnosis present

## 2024-06-12 LAB — CBC
HCT: 36.3 % — ABNORMAL LOW (ref 39.0–52.0)
Hemoglobin: 12.4 g/dL — ABNORMAL LOW (ref 13.0–17.0)
MCH: 31.6 pg (ref 26.0–34.0)
MCHC: 34.2 g/dL (ref 30.0–36.0)
MCV: 92.4 fL (ref 80.0–100.0)
Platelets: 63 K/uL — ABNORMAL LOW (ref 150–400)
RBC: 3.93 MIL/uL — ABNORMAL LOW (ref 4.22–5.81)
RDW: 19.7 % — ABNORMAL HIGH (ref 11.5–15.5)
WBC: 4.7 K/uL (ref 4.0–10.5)
nRBC: 0 % (ref 0.0–0.2)

## 2024-06-12 LAB — COMPREHENSIVE METABOLIC PANEL WITH GFR
ALT: 54 U/L — ABNORMAL HIGH (ref 0–44)
AST: 76 U/L — ABNORMAL HIGH (ref 15–41)
Albumin: 2.2 g/dL — ABNORMAL LOW (ref 3.5–5.0)
Alkaline Phosphatase: 242 U/L — ABNORMAL HIGH (ref 38–126)
Anion gap: 13 (ref 5–15)
BUN: 20 mg/dL (ref 8–23)
CO2: 27 mmol/L (ref 22–32)
Calcium: 7.7 mg/dL — ABNORMAL LOW (ref 8.9–10.3)
Chloride: 105 mmol/L (ref 98–111)
Creatinine, Ser: 1.65 mg/dL — ABNORMAL HIGH (ref 0.61–1.24)
GFR, Estimated: 41 mL/min — ABNORMAL LOW (ref >60)
Glucose, Bld: 103 mg/dL — ABNORMAL HIGH (ref 70–99)
Potassium: 2.7 mmol/L — CL (ref 3.5–5.1)
Sodium: 145 mmol/L (ref 135–145)
Total Bilirubin: 3.6 mg/dL — ABNORMAL HIGH (ref 0.0–1.2)
Total Protein: 4.8 g/dL — ABNORMAL LOW (ref 6.5–8.1)

## 2024-06-12 LAB — URINALYSIS, ROUTINE W REFLEX MICROSCOPIC
Bilirubin Urine: NEGATIVE
Glucose, UA: 50 mg/dL — AB
Ketones, ur: NEGATIVE mg/dL
Nitrite: NEGATIVE
Protein, ur: 30 mg/dL — AB
Specific Gravity, Urine: 1.021 (ref 1.005–1.030)
WBC, UA: >50 WBC/hpf (ref 0–5)
pH: 8 (ref 5.0–8.0)

## 2024-06-12 LAB — BASIC METABOLIC PANEL WITH GFR
Anion gap: 13 (ref 5–15)
BUN: 19 mg/dL (ref 8–23)
CO2: 27 mmol/L (ref 22–32)
Calcium: 7.8 mg/dL — ABNORMAL LOW (ref 8.9–10.3)
Chloride: 100 mmol/L (ref 98–111)
Creatinine, Ser: 1.63 mg/dL — ABNORMAL HIGH (ref 0.61–1.24)
GFR, Estimated: 42 mL/min — ABNORMAL LOW (ref >60)
Glucose, Bld: 195 mg/dL — ABNORMAL HIGH (ref 70–99)
Potassium: 3.3 mmol/L — ABNORMAL LOW (ref 3.5–5.1)
Sodium: 140 mmol/L (ref 135–145)

## 2024-06-12 LAB — MAGNESIUM
Magnesium: 1.1 mg/dL — ABNORMAL LOW (ref 1.7–2.4)
Magnesium: 1.8 mg/dL (ref 1.7–2.4)

## 2024-06-12 LAB — T4, FREE: Free T4: 1.23 ng/dL — ABNORMAL HIGH (ref 0.61–1.12)

## 2024-06-12 LAB — LACTIC ACID, PLASMA
Lactic Acid, Venous: 2.8 mmol/L (ref 0.5–1.9)
Lactic Acid, Venous: 3.2 mmol/L (ref 0.5–1.9)

## 2024-06-12 LAB — FOLATE: Folate: 11.7 ng/mL (ref >5.9)

## 2024-06-12 LAB — AMMONIA: Ammonia: 44 umol/L — ABNORMAL HIGH (ref 9–35)

## 2024-06-12 LAB — TSH: TSH: 2.634 u[IU]/mL (ref 0.350–4.500)

## 2024-06-12 MED ORDER — ENSURE PLUS HIGH PROTEIN PO LIQD
237.0000 mL | Freq: Three times a day (TID) | ORAL | Status: DC
  Administered 2024-06-12 – 2024-06-14 (×7): 237 mL via ORAL

## 2024-06-12 MED ORDER — CHLORHEXIDINE GLUCONATE CLOTH 2 % EX PADS
6.0000 | MEDICATED_PAD | Freq: Every day | CUTANEOUS | Status: DC
  Administered 2024-06-12 – 2024-06-13 (×2): 6 via TOPICAL

## 2024-06-12 MED ORDER — MIRABEGRON ER 25 MG PO TB24
25.0000 mg | ORAL_TABLET | Freq: Every day | ORAL | Status: DC
  Filled 2024-06-12: qty 1

## 2024-06-12 MED ORDER — MAGIC MOUTHWASH
10.0000 mL | Freq: Four times a day (QID) | ORAL | Status: DC
  Administered 2024-06-12 – 2024-06-14 (×8): 10 mL via ORAL
  Filled 2024-06-12 (×12): qty 10

## 2024-06-12 MED ORDER — LACTATED RINGERS IV BOLUS
1000.0000 mL | Freq: Once | INTRAVENOUS | Status: AC
  Administered 2024-06-12: 1000 mL via INTRAVENOUS

## 2024-06-12 MED ORDER — DEXTROSE IN LACTATED RINGERS 5 % IV SOLN: INTRAVENOUS | Status: DC

## 2024-06-12 MED ORDER — PANTOPRAZOLE SODIUM 40 MG PO TBEC
40.0000 mg | DELAYED_RELEASE_TABLET | Freq: Every day | ORAL | Status: DC
  Administered 2024-06-12 – 2024-06-14 (×3): 40 mg via ORAL
  Filled 2024-06-12 (×4): qty 1

## 2024-06-12 MED ORDER — DOLUTEGRAVIR-LAMIVUDINE 50-300 MG PO TABS
1.0000 | ORAL_TABLET | Freq: Every day | ORAL | Status: DC
  Filled 2024-06-12: qty 1

## 2024-06-12 MED ORDER — POTASSIUM CHLORIDE 10 MEQ/100ML IV SOLN
10.0000 meq | INTRAVENOUS | Status: AC
  Administered 2024-06-12 (×6): 10 meq via INTRAVENOUS
  Filled 2024-06-12 (×6): qty 100

## 2024-06-12 MED ORDER — DOLUTEGRAVIR-LAMIVUDINE 50-300 MG PO TABS
1.0000 | ORAL_TABLET | ORAL | Status: DC
  Administered 2024-06-13 – 2024-06-14 (×2): 1 via ORAL
  Filled 2024-06-12 (×2): qty 1

## 2024-06-12 MED ORDER — DEXTROSE-SODIUM CHLORIDE 5-0.9 % IV SOLN: INTRAVENOUS | Status: DC

## 2024-06-12 MED ORDER — MAGNESIUM SULFATE 2 GM/50ML IV SOLN
2.0000 g | Freq: Once | INTRAVENOUS | Status: AC
  Administered 2024-06-12: 2 g via INTRAVENOUS
  Filled 2024-06-12: qty 50

## 2024-06-12 MED ORDER — POTASSIUM CHLORIDE 10 MEQ/100ML IV SOLN
10.0000 meq | INTRAVENOUS | Status: AC
  Administered 2024-06-12 – 2024-06-13 (×4): 10 meq via INTRAVENOUS
  Filled 2024-06-12 (×4): qty 100

## 2024-06-12 NOTE — Patient Instructions (Signed)
 Visit Information  Thank you for taking time to visit with me today. Please don't hesitate to contact me if I can be of assistance to you before our next scheduled appointment.  Our next appointment is by telephone on 07/09/24 at 1:00 PM  Please call the care guide team at [phone] if you need to cancel or reschedule your appointment.   Following is a copy of your care plan:   Goals Addressed             This Visit's Progress    VBCI RN Care Plan related to Failure to Thrive, Dysphagia, Falls       Problems:  Chronic Disease Management support and education needs related to Failure to Thrive; Dysphagia; Falls   Goal: Over the next 90 days the Caregiver will continue to work with Medical illustrator and/or Social Worker to address care management and care coordination needs related to Failure to Thrive; Dysphagia; Falls  as evidenced by adherence to care management team scheduled appointments      Interventions:   Evaluation of current treatment plan related to Failure to Thrive, Dysphagia; Falls, self-management and patient's adherence to plan as established by provider Determined patient is taking in less oral caloric intake due to having dysphagia Reviewed and discussed wife's concerns re: his rapid severe weight loss, weakness and recent falls  Review of patient status, including review of consultant's reports, relevant laboratory and other test results, and medication reconciliation completed Educated wife regarding hydration, aiming for 48-64 oz of water daily  Educated wife [name] re: aspiration risk, educated regarding signs/symptoms of aspiration and when to call the doctor if symptoms occur, she verbalizes understanding  Reviewed and discussed patient's upcoming scheduled follow up with Neuro ST eval scheduled for 06/25/24 at 1:15 PM Educated patient about Palliative Care, Hospice Care and [facility], determined wife [name] would like for patient to be referred to [facility]    Sent a secure email to Katheryn Favors RN with [facility] regarding referral for home based primary care  Discussed plans with patient for ongoing care management follow up and provided patient with direct contact information for care management team   Patient Self-Care Activities:  Attend all scheduled provider appointments Call pharmacy for medication refills 3-7 days in advance of running out of medications Call provider office for new concerns or questions  Take medications as prescribed   Work with the nurse care manager to address care coordination needs and will continue to work with the clinical team to address health care and disease management related needs  Plan:  Keep your Follow-up appointment with Neuro ST scheduled for: 06/25/24 at 1:15 PM Telephone follow up appointment with care management team member scheduled for:  Monday, September 22 at 1:00 PM Referral to [facility]              Please call 1-800-273-TALK (toll free, 24 hour hotline) if you are experiencing a Mental Health or Behavioral Health Crisis or need someone to talk to.  The patient verbalized understanding of instructions, educational materials, and care plan provided today and DECLINED offer to receive copy of patient instructions, educational materials, and care plan.   Clayborne Ly RN BSN CCM [place]  [facility], [facility] Nurse Care Coordinator  Direct Dial: [phone] Website: [email or web address]

## 2024-06-12 NOTE — Evaluation (Signed)
 Occupational Therapy Evaluation Patient Details Name: Jeremy Reeves MRN: [id number] DOB: 10-Oct-1941 Today's Date: 06/12/2024   History of Present Illness   Pt is an 83 y.o. male who presented 06/11/24  from his infectious disease doctor's office for hypotension and failure to thrive. CT head negative for acute abnormalities. PMH: HTN, HLD, ischemic CAD s/p 3 vessel CABG 1999, afib on Eliquis , DM2, CKD stage IIIa, HIV, BPH, GERD, thrombocytopenia, dementia     Clinical Impressions Arush was evaluated s/p the above admission list. He needs cognitive assist from his wife at baseline, he does not use an AD and has had one recent fall. Upon evaluation the pt was limited by dementia, weakness, posterior and R lateral LOB, unsteady balance and decreased activity tolerance. Overall he needed up to min A for mobility without AD. Due to the deficits listed below the pt also needs up to mod A for LB ADLs. Pt will benefit from continued acute OT services and HHOT and HH aide to reduce caregiver burden.      If plan is discharge home, recommend the following:   A little help with walking and/or transfers;A lot of help with bathing/dressing/bathroom;Assistance with cooking/housework;Assist for transportation;Direct supervision/assist for medications management;Direct supervision/assist for financial management;Supervision due to cognitive status;Help with stairs or ramp for entrance     Functional Status Assessment   Patient has had a recent decline in their functional status and demonstrates the ability to make significant improvements in function in a reasonable and predictable amount of time.     Equipment Recommendations   None recommended by OT      Precautions/Restrictions   Precautions Precautions: Fall Recall of Precautions/Restrictions: Impaired Restrictions Weight Bearing Restrictions Per Provider Order: No     Mobility Bed Mobility Overal bed mobility: Needs Assistance              General bed mobility comments: Pt OOB on my arrival    Transfers Overall transfer level: Needs assistance Equipment used: Rolling walker (2 wheels), 1 person hand held assist Transfers: Sit to/from Stand, Bed to chair/wheelchair/BSC Sit to Stand: Min assist     Step pivot transfers: Min assist     General transfer comment: attempted RW, pt unfamiliar and it posed as a fall hazard. HHA given.      Balance Overall balance assessment: Needs assistance Sitting-balance support: No upper extremity supported, Feet supported Sitting balance-Leahy Scale: Fair Sitting balance - Comments: able to reach off COG in figure 4 position to try to manage socks with extra time, but no LOB, supervision for safety   Standing balance support: Single extremity supported, No upper extremity supported, During functional activity Standing balance-Leahy Scale: Poor Standing balance comment: reliant on UE support and minA                           ADL either performed or assessed with clinical judgement   ADL Overall ADL's : Needs assistance/impaired Eating/Feeding: Set up;Sitting   Grooming: Minimal assistance;Standing   Upper Body Bathing: Minimal assistance;Sitting   Lower Body Bathing: Moderate assistance;Sit to/from stand   Upper Body Dressing : Minimal assistance;Sitting   Lower Body Dressing: Moderate assistance;Sit to/from stand   Toilet Transfer: Minimal assistance;Ambulation   Toileting- Clothing Manipulation and Hygiene: Contact guard assist;Sitting/lateral lean       Functional mobility during ADLs: Minimal assistance General ADL Comments: cues for initiation, sequencing and attention. physical assist for balance     Vision Baseline Vision/History:  0 No visual deficits Vision Assessment?: No apparent visual deficits     Perception Perception: Not tested       Praxis Praxis: Not tested       Pertinent Vitals/Pain Pain Assessment Pain  Assessment: Faces Faces Pain Scale: No hurt Pain Intervention(s): Monitored during session     Extremity/Trunk Assessment Upper Extremity Assessment Upper Extremity Assessment: Generalized weakness   Lower Extremity Assessment Lower Extremity Assessment: Defer to PT evaluation   Cervical / Trunk Assessment Cervical / Trunk Assessment: Kyphotic (mild)   Communication Communication Communication: Impaired Factors Affecting Communication: Hearing impaired   Cognition Arousal: Alert Behavior During Therapy: WFL for tasks assessed/performed Cognition: History of cognitive impairments             OT - Cognition Comments: hx of dementia, pt oriented to himself and his wife. he followed most simple 1 step commands and needed cues for re-direction                 Following commands: Impaired Following commands impaired: Follows one step commands inconsistently, Follows one step commands with increased time     Cueing  General Comments   Cueing Techniques: Verbal cues;Tactile cues;Visual cues;Gestural cues  VSS.           Home Living Family/patient expects to be discharged to:: Private residence Living Arrangements: Spouse/significant other Available Help at Discharge: Family;Available 24 hours/day Type of Home: House Home Access: Level entry     Home Layout: One level     Bathroom Shower/Tub: Chief Strategy Officer: Handicapped height     Home Equipment: Cane - quad;Grab bars - tub/shower;Transport chair          Prior Functioning/Environment Prior Level of Function : Needs assist             Mobility Comments: Independent without AD prior to fall in tub last week, then pt has needed intermittent assistance since then ADLs Comments: Independent for ADLs prior to fall in tub last week, then pt has been needing assistance for LB ADLs; needs assistance for transportation and medication management at baseline    OT Problem List:  Decreased strength;Decreased range of motion;Decreased activity tolerance;Impaired balance (sitting and/or standing);Decreased safety awareness;Decreased knowledge of use of DME or AE;Decreased knowledge of precautions   OT Treatment/Interventions: Self-care/ADL training;Therapeutic exercise;DME and/or AE instruction;Therapeutic activities;Patient/family education;Balance training      OT Goals(Current goals can be found in the care plan section)   Acute Rehab OT Goals Patient Stated Goal: per wife: to go home OT Goal Formulation: With patient Time For Goal Achievement: 06/26/24 Potential to Achieve Goals: Good ADL Goals Pt Will Perform Grooming: with supervision Pt Will Perform Lower Body Dressing: with supervision Pt Will Transfer to Toilet: with supervision Additional ADL Goal #1: pt will complete functional mobility in hospital environment with supervision A to demonstrate low fall risk   OT Frequency:  Min 2X/week       AM-PAC OT 6 Clicks Daily Activity     Outcome Measure Help from another person eating meals?: A Little Help from another person taking care of personal grooming?: A Little Help from another person toileting, which includes using toliet, bedpan, or urinal?: A Little Help from another person bathing (including washing, rinsing, drying)?: A Lot Help from another person to put on and taking off regular upper body clothing?: A Little Help from another person to put on and taking off regular lower body clothing?: A Lot 6 Click Score: 16  End of Session Equipment Utilized During Treatment: Gait belt Nurse Communication: Mobility status  Activity Tolerance: Patient tolerated treatment well Patient left: in chair;with call bell/phone within reach;with chair alarm set;with family/visitor present  OT Visit Diagnosis: Unsteadiness on feet (R26.81);Other abnormalities of gait and mobility (R26.89);Muscle weakness (generalized) (M62.81);History of falling (Z91.81)                 Time: 8663-8645 OT Time Calculation (min): 18 min Charges:  OT General Charges $OT Visit: 1 Visit OT Evaluation $OT Eval Moderate Complexity: 1 Mod  Lucie Kendall, OTR/L Acute Rehabilitation Services Office [phone] Secure Chat Communication Preferred   Lucie JONETTA Kendall 06/12/2024, 3:00 PM

## 2024-06-12 NOTE — Evaluation (Signed)
 Physical Therapy Evaluation Patient Details Name: Jeremy Reeves MRN: [id number] DOB: 1940/12/21 Today's Date: 06/12/2024  History of Present Illness  Pt is an 83 y.o. male who presented 06/11/24  from his infectious disease doctor's office for hypotension and failure to thrive. CT head negative for acute abnormalities. PMH: HTN, HLD, ischemic CAD s/p 3 vessel CABG 1999, afib on Eliquis , DM2, CKD stage IIIa, HIV, BPH, GERD, thrombocytopenia, dementia   Clinical Impression  Pt presents with condition above and deficits mentioned below, see PT Problem List. PTA, he was independent without DME for functional mobility until he fell when trying to get out of the tub x1 week prior. Wife denies any other falls. His wife reports he is not normally as confused as he currently is. He is currently only oriented to himself and is easily distracted. He displayed questionable L inattention, trying repeatedly to put a second sock on his R leg instead of the L when cued even though he just donned a sock on his R. He tends to lean and sway posteriorly and to his R when standing, needing minA for transfers and ambulation bouts to prevent LOB. He often leaves the RW despite cues and opts to ambulate without UE support or with intermittent R UE support on a wall rail. He needs min-modA for bed mobility. He is currently at risk for falls. Provided pt's wife with a gait belt and educated her of pt's current need for someone to guard/assist him with all standing mobility. She verbalized understanding. The pt's confusion will likely improve once back in his familiar environment, and his wife reports desire to return home with more HH support rather than to a nursing home. Thus considering these factors and that the pt has 24/7 care available from his wife, recommending pt d/c home with HHPT, max HH services and HH aides to reduce caregiver burden and improve pt safety. Will continue to follow acutely.      If plan is  discharge home, recommend the following: A little help with walking and/or transfers;A little help with bathing/dressing/bathroom;Assistance with cooking/housework;Direct supervision/assist for medications management;Direct supervision/assist for financial management;Assist for transportation;Supervision due to cognitive status   Can travel by private vehicle        Equipment Recommendations BSC/3in1;Wheelchair (measurements PT);Wheelchair cushion (measurements PT);Hospital bed;Other (comment) (tub bench/shower chair; RW if pt will actually use it)  Recommendations for Other Services       Functional Status Assessment Patient has had a recent decline in their functional status and demonstrates the ability to make significant improvements in function in a reasonable and predictable amount of time.     Precautions / Restrictions Precautions Precautions: Fall Recall of Precautions/Restrictions: Impaired Restrictions Weight Bearing Restrictions Per Provider Order: No      Mobility  Bed Mobility Overal bed mobility: Needs Assistance Bed Mobility: Supine to Sit     Supine to sit: Mod assist, HOB elevated     General bed mobility comments: Pt had difficulty remaining on task, initiating bringing legs off R EOB and initiating trunk ascension but then stopped with legs dangling off EOB and pt propped on R elbow, needing minA to complete trunk ascension and modA to scoot hips to EOB as pt did not understand cues to scoot anteriorly.    Transfers Overall transfer level: Needs assistance Equipment used: Rolling walker (2 wheels) Transfers: Sit to/from Stand, Bed to chair/wheelchair/BSC Sit to Stand: Min assist   Step pivot transfers: Min assist       General  transfer comment: Pt leans posteriorly, needing minA to stand from EOB x2 reps and from recliner x1 rep. Cued pt to use the RW, but pt often leaving it when step pivoting to R bed to recliner, minA for balance.     Ambulation/Gait Ambulation/Gait assistance: Min assist, +2 safety/equipment Gait Distance (Feet): 80 Feet (x2 bouts of ~2 ft > ~80 ft) Assistive device: None, Rolling walker (2 wheels) (wall rail intermittently) Gait Pattern/deviations: Step-through pattern, Decreased step length - right, Decreased step length - left, Decreased stride length, Staggering right, Leaning posteriorly Gait velocity: reduced Gait velocity interpretation: <1.31 ft/sec, indicative of household ambulator   General Gait Details: Pt takes slow, small, unsteady steps, often leaning posteriorly and to the R, intermittently staggering R. Pt tends to leave the RW and opts to not use an AD but instead intermittently just hold onto the R wall rail. MinA needed for balance throughout along with cuing to follow his wife to progress gait as he is easily distracted.  Stairs            Wheelchair Mobility     Tilt Bed    Modified Rankin (Stroke Patients Only) Modified Rankin (Stroke Patients Only) Pre-Morbid Rankin Score: Moderate disability Modified Rankin: Moderately severe disability     Balance Overall balance assessment: Needs assistance Sitting-balance support: No upper extremity supported, Feet supported Sitting balance-Leahy Scale: Fair Sitting balance - Comments: able to reach off COG in figure 4 position to try to manage socks with extra time, but no LOB, supervision for safety   Standing balance support: Single extremity supported, No upper extremity supported, During functional activity Standing balance-Leahy Scale: Poor Standing balance comment: reliant on UE support and minA                             Pertinent Vitals/Pain Pain Assessment Pain Assessment: Faces Faces Pain Scale: No hurt Pain Intervention(s): Monitored during session    Home Living Family/patient expects to be discharged to:: Private residence Living Arrangements: Spouse/significant other Available Help at  Discharge: Family;Available 24 hours/day Type of Home: House Home Access: Level entry       Home Layout: One level Home Equipment: Cane - quad;Grab bars - tub/shower;Transport chair      Prior Function Prior Level of Function : Needs assist             Mobility Comments: Independent without AD prior to fall in tub last week, then pt has needed intermittent assistance since then ADLs Comments: Independent for ADLs prior to fall in tub last week, then pt has been needing assistance for LB ADLs; needs assistance for transportation and medication management at baseline     Extremity/Trunk Assessment   Upper Extremity Assessment Upper Extremity Assessment: Defer to OT evaluation    Lower Extremity Assessment Lower Extremity Assessment: Generalized weakness       Communication   Communication Communication: Impaired Factors Affecting Communication: Hearing impaired    Cognition Arousal: Alert Behavior During Therapy: WFL for tasks assessed/performed   PT - Cognitive impairments: Orientation, Awareness, Memory, Attention, Initiation, Sequencing, Problem solving, Safety/Judgement   Orientation impairments: Time, Situation, Place                   PT - Cognition Comments: Pt unable to identify location, when asked if he was at home he said yes. Pt did not know his birthday falls on Halloween. Pt with questionable L inattention, trying to put  socks on his R foot repeatedly even when he already had a sock donned. Poor awareness of his posterior and R lateral lean and problem solving to correct to maintain his safety. Pt unaware of him sitting in his own BM upon arrival. Wife reports pt is not normally so confused at baseline. Following commands: Impaired Following commands impaired: Follows one step commands inconsistently, Follows one step commands with increased time     Cueing Cueing Techniques: Verbal cues, Tactile cues, Visual cues, Gestural cues     General  Comments General comments (skin integrity, edema, etc.): educated pt's wife on delirium precautions and provided her with a gait belt, educating her that the pt is currently needing someone to assist him with all standing mobility    Exercises     Assessment/Plan    PT Assessment Patient needs continued PT services  PT Problem List Decreased strength;Decreased activity tolerance;Decreased mobility;Decreased balance;Decreased coordination;Decreased cognition;Decreased knowledge of use of DME;Decreased safety awareness       PT Treatment Interventions DME instruction;Gait training;Functional mobility training;Therapeutic activities;Therapeutic exercise;Balance training;Neuromuscular re-education;Patient/family education;Cognitive remediation;Wheelchair mobility training    PT Goals (Current goals can be found in the Care Plan section)  Acute Rehab PT Goals Patient Stated Goal: to improve and return home per wife PT Goal Formulation: With patient/family Time For Goal Achievement: 06/26/24 Potential to Achieve Goals: Fair    Frequency Min 2X/week     Co-evaluation               AM-PAC PT 6 Clicks Mobility  Outcome Measure Help needed turning from your back to your side while in a flat bed without using bedrails?: A Little Help needed moving from lying on your back to sitting on the side of a flat bed without using bedrails?: A Lot Help needed moving to and from a bed to a chair (including a wheelchair)?: A Little Help needed standing up from a chair using your arms (e.g., wheelchair or bedside chair)?: A Little Help needed to walk in hospital room?: A Little Help needed climbing 3-5 steps with a railing? : A Lot 6 Click Score: 16    End of Session Equipment Utilized During Treatment: Gait belt Activity Tolerance: Patient tolerated treatment well Patient left: in chair;with call bell/phone within reach;with chair alarm set;with family/visitor present Nurse Communication:  Mobility status PT Visit Diagnosis: Unsteadiness on feet (R26.81);Other abnormalities of gait and mobility (R26.89);Muscle weakness (generalized) (M62.81);Difficulty in walking, not elsewhere classified (R26.2);History of falling (Z91.81);Adult, failure to thrive (R62.7)    Time: 8680-8663 PT Time Calculation (min) (ACUTE ONLY): 17 min   Charges:   PT Evaluation $PT Eval Moderate Complexity: 1 Mod   PT General Charges $$ ACUTE PT VISIT: 1 Visit         Theo Ferretti, PT, DPT Acute Rehabilitation Services  Office: [phone]   Theo CHRISTELLA Ferretti 06/12/2024, 2:17 PM

## 2024-06-12 NOTE — TOC CM/SW Note (Signed)
 Transition of Care [facility]) - Inpatient Brief Assessment   Patient Details  Name: Jeremy Reeves MRN: [id number] Date of Birth: 30-Jul-1941  Transition of [facility]) CM/SW Contact:    Tom-Johnson, Harvest Muskrat, RN Phone Number: 06/12/2024, 3:47 PM   Clinical Narrative:  Patient presented to the ED from his Infectious Disease doctor's office for Hypotension, Weight Loss and Failure to Thrive. Has hx of  A-Fib on Eliquis , CAD s/p CABG, T2DM, HTN, HLD, Cognitive Impairment, HIV, BPH, Thrombocytopenia, GERD, Dysphagia, CKD 3a, Rectal Polyps.  From home with wife, [name] who is at bedside. Has two supportive children. [name] is patient's caregiver and transports to and from appointments. Has a 4 prong cane, w/c and rails.  PCP is [name], MD and uses [facility] [address] and [address] [employer] [address] delivery.   Home health recommended, [name] has no preference. CM sent referral to [facility] and Darleene noted acceptance, info on AVS. Palliative consulted for GOC.  Patient not Medically ready for discharge.  CM will continue to follow as patient progresses with care towards discharge.            Transition of Care Asessment: Insurance and Status: Insurance coverage has been reviewed Patient has primary care physician: Yes Home environment has been reviewed: Yes Prior level of function:: Modified Independent Prior/Current Home Services: No current home services Social Drivers of Health Review: SDOH reviewed no interventions necessary Readmission risk has been reviewed: Yes Transition of care needs: transition of care needs identified, TOC will continue to follow

## 2024-06-12 NOTE — Evaluation (Addendum)
 Clinical/Bedside Swallow Evaluation Patient Details  Name: Jeremy Reeves MRN: [id number] Date of Birth: Jan 21, 1941  Today's Date: 06/12/2024 Time: SLP Start Time (ACUTE ONLY): 0755 SLP Stop Time (ACUTE ONLY): 0831 SLP Time Calculation (min) (ACUTE ONLY): 36 min  Past Medical History:  Past Medical History:  Diagnosis Date   ASHD (arteriosclerotic heart disease)    BPH (benign prostatic hyperplasia)    Difficult intubation    anesthesia record 1999 / record on chart   Foley catheter in place 10/23/12   GERD (gastroesophageal reflux disease)    HH (hiatus hernia)    Hiatal hernia    HIV (human immunodeficiency virus infection) (HCC) dx'd 2014   Hyperlipidemia    Hypertension    Shingles 8/14   Type II diabetes mellitus (HCC)    pre diabetes   Past Surgical History:  Past Surgical History:  Procedure Laterality Date   CARDIAC CATHETERIZATION  05/02/1998   Recommend CABG   CARDIOVASCULAR STRESS TEST  12/01/2011   Perfusion defect consistent with diaphragmatic attenuation. There is no scintigraphic of inducible myocardial ischemia.    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 LIMA to the LAD, vein to obtuse marginal, and vein to RCA   IRRIGATION AND DEBRIDEMENT ABSCESS Right 12/04/2015   Procedure: MINOR INCISION AND DRAINAGE OF ABSCESS;  Surgeon: Franky Curia, MD;  Location: [facility];  Service: Orthopedics;  Laterality: Right;   TRANSTHORACIC ECHOCARDIOGRAM  03/29/2011   EF ~50%, mild-moderate inferior wall hypokinesis   TRANSURETHRAL RESECTION OF PROSTATE N/A 10/25/2013   Procedure: TRANSURETHRAL RESECTION OF THE PROSTATE WITH GYRUS BUTTON;  Surgeon: Norleen Seltzer, MD;  Location: [facility];  Service: Urology;  Laterality: N/A;   HPI:  Jeremy Reeves adm to [facility] with weight loss of 36 pounds and hypotensive- He has dementia, HIV, CAD s/p 3 vessel CABG, HTN, HLD, 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 Pt is on Glucerna - at home.  Pt s/p esophagram 03/2024  showed suspected esophageal that held up the tablet and he had laryngeal penetration.  Pt resides at home with his wife and she reports he orally holds boluses requiring cues to swallow.   Wife endorses that he was on an antibiotic recently for a cough.  Swallow eval ordered.    Assessment / Plan / Recommendation  Clinical Impression  Patient demonstrates oral cognitive based dysphagia c/b oral holding and delayed swallow. There are no indication of airway compromise across all po he would accept. Spouse present and fed pt some during the session - as well as SLP having pt feed himself.  Slight facial asymmetry on the left noted - but otherwise no focal CN deficits.  Delayed swallow across all po noted - along with oral retention - presumed due to decreased awareness of residuals.  He does respond well to liquid swallows to help clear food from mouth. However food remains on mid-tongue -without awareness- Finally pt cleared his oral retention with liquids.  He winces with swallowing but denies discomfort in mouth/throat or esophagus - Noted tongue has slight whitish-yellow coating mid-area that SLP brushed - removing approx 50%.   Pt was able to swish and expectorate with water - and recommend continue to do so after ALL MEALS.  Suspect this may impact gustation - -  Discussed wife pt and wife finger foods, having Bentlee self feeding as able - and gustatory changes with progression of dementia *sweet being last taste bud intact*. His wife prefers to  maintain Imer on pureed diet at this time.  All education completed for compensation strategies for cognitive based oral dysphagia using teach back - Wife reports pt eats chicken nuggets, etc at home -after SLP discussed finger foods.  No SLP follow up needed. Thanks for this consult of this most pleasant pt and family. SLP Visit Diagnosis: Dysphagia, oral phase (R13.11)    Aspiration Risk       Diet Recommendation Thin liquid;Dysphagia 1 (Puree) (per wife)     Supervision: Staff to assist with self feeding Compensations: Slow rate;Small sips/bites;Other (Comment);Lingual sweep for clearance of pocketing (swish and expectorate with water after meals) Postural Changes: Remain upright for at least 30 minutes after po intake;Seated upright at 90 degrees    Other  Recommendations Oral Care Recommendations: Oral care BID     Assistance Recommended at Discharge  Full with wife  Functional Status Assessment Patient has not had a recent decline in their functional status- chronic dysphagia  Frequency and Duration     N/a       Prognosis     N/a   Swallow Study   General Date of Onset: 06/12/24 HPI: Jeremy Reeves adm to [facility] with weight loss of 36 pounds - He has dementia, HIV, CAD s/p 3 vessel CABG, HTN, HLD, 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 Pt is on Glucerna - at home.  Pt s/p esophagram 03/2024 showed suspected esophageal that held up the tablet and he had laryngeal penetration.  Pt resides at home with his wife and she reports he orally holds boluses requiring cues to swallow.   Wife endorses that he was on an antibiotic recently for a cough.  Swallow eval ordered. Diet Prior to this Study: Dysphagia 1 (pureed);Thin liquids (Level 0) Temperature Spikes Noted: No Respiratory Status: Room air History of Recent Intubation: No Behavior/Cognition: Alert;Cooperative;Pleasant mood Oral Cavity Assessment: Dry;Excessive secretions;Other (comment) (coating on the tongue - but not bilateral oral cavity) Oral Care Completed by SLP: Yes Oral Cavity - Dentition: Dentures, top;Adequate natural dentition Vision: Functional for self-feeding Self-Feeding Abilities: Able to feed self Patient Positioning: Upright in bed Baseline Vocal Quality: Low vocal intensity (normal per wife) Volitional Cough: Cognitively unable to elicit Volitional Swallow: Unable to elicit    Oral/Motor/Sensory Function Overall Oral Motor/Sensory Function: Generalized oral weakness   Ice  Chips Ice chips: Not tested   Thin Liquid Thin Liquid: Impaired Presentation: Cup;Straw Oral Phase Functional Implications: Oral holding Pharyngeal  Phase Impairments: Suspected delayed Swallow    Nectar Thick Nectar Thick Liquid: Not tested   Honey Thick Honey Thick Liquid: Not tested   Puree Puree: Impaired Presentation: Self Fed Oral Phase Functional Implications: Oral holding;Oral residue Pharyngeal Phase Impairments: Suspected delayed Swallow   Solid     Solid: Impaired Presentation: Self Fed Oral Phase Functional Implications: Prolonged oral transit;Oral residue Pharyngeal Phase Impairments: Suspected delayed Swallow Other Comments: waffle bite - essentially is a solid      Nicolas Emmie Caldron 06/12/2024,8:50 AM  Madelin POUR, MS [facility] SLP Acute Rehab Services Office [phone]

## 2024-06-12 NOTE — Plan of Care (Signed)
  Problem: Pain Managment: Goal: General experience of comfort will improve and/or be controlled Outcome: Progressing   Problem: Pain Managment: Goal: General experience of comfort will improve and/or be controlled Outcome: Progressing

## 2024-06-12 NOTE — Plan of Care (Signed)
  Problem: Pain Managment: Goal: General experience of comfort will improve and/or be controlled 06/12/2024 2334 by Peri Magdalena LABOR, RN Outcome: Progressing 06/12/2024 2333 by Peri Magdalena LABOR, RN Outcome: Progressing   Problem: Education: Goal: Knowledge of General Education information will improve Description: Including pain rating scale, medication(s)/side effects and non-pharmacologic comfort measures Outcome: Progressing   Problem: Health Behavior/Discharge Planning: Goal: Ability to manage health-related needs will improve Outcome: Progressing   Problem: Nutrition: Goal: Adequate nutrition will be maintained Outcome: Progressing   Problem: Coping: Goal: Level of anxiety will decrease Outcome: Progressing   Problem: Elimination: Goal: Will not experience complications related to bowel motility Outcome: Progressing Goal: Will not experience complications related to urinary retention Outcome: Progressing   Problem: Safety: Goal: Ability to remain free from injury will improve Outcome: Progressing   Problem: Skin Integrity: Goal: Risk for impaired skin integrity will decrease Outcome: Progressing

## 2024-06-12 NOTE — Consult Note (Signed)
 Consultation Note Date: 06/12/2024   Patient Name: Jeremy Reeves  DOB: Feb 19, 1941  MRN: [id number]  Age / Sex: 83 y.o., male  PCP: [name], MD Referring Physician: Tobie Yetta HERO, MD  Reason for Consultation: Establishing goals of care  HPI/Patient Profile: 83 y.o. male  with past medical history of cognitive impairment, HTN, HLD, ischemic CAD s/p 3 vessel CABG, atrial fibrillation on Eliquis , diabetes mellitus type 2, CKD stage 3a, HIV positive, BPH, thrombocytopenia, GERD, rectal polyp admitted on 06/11/2024 with weight loss and failure to thrive.   Clinical Assessment and Goals of Care: Consult received and chart review completed. Noted plans for outpatient palliative referral. I met today with Jeremy Reeves but he is working with PT. He is awake and interactive but confused. No family at bedside.   Update: I returned to bedside and met with wife, [name], at bedside. Jeremy Reeves is lying in bed. He is present but unable to participate in conversation due to his confusion. [name] shares that she and her husband live alone. They have 2 children but they do not live nearby. She shares that Jeremy Reeves is functional and able to walk around the home but he is not always stable and she always follows close by. She talks of holding food and rapid weight loss. She reports that he is doing well with pureed food so far.   We spoke more about cognitive decline (this has been much worse in the hospital). We reviewed cognitive oral dysphagia which seems to be the most significant problem along with some esophageal dysmotility. Jeremy Reeves confirms that they do not desire EGD or invasive procedures. We discussed his Living Will and wishes. We discussed code status and she agrees that DNR/DNI is aligned with his wishes. She tells me that she and her husband are getting older and that it is expected to get harder. She  acknowledges that she knows his health is declining and she is preparing herself. She shares that they completed Living Will after Jeremy Reeves sister went through end of life and health issues. We reviewed that he indicated he would not want feeding tube either and Jeremy Reeves confirms. Goal is to help Mr. Tant to do as well as possible with medications and conservative means while focusing on his comfort and happiness. She also would like to keep him at home. She plans to look into his long term care policy for care options. She was already expecting an appointment with [facility] palliative outpatient and is open to continuing this along with home health services. We did discuss the option to transition to hospice when his health and care needs worsen.   All questions/concerns addressed. Emotional support provided.    Primary Decision Maker HCPOA wife Jeremy Reeves    SUMMARY OF RECOMMENDATIONS   - DNR/DNI - No feeding tube - Continue conservative care - Home with home health and outpatient palliative care  Code Status/Advance Care Planning: DNR   Symptom Management:  Per attending  Prognosis:  Long term prognosis poor.  Discharge Planning: Home with Palliative Services      Primary Diagnoses: Present on Admission:  Failure to thrive syndrome, adult   I have reviewed the medical record, interviewed the patient and family, and examined the patient. The following aspects are pertinent.  Past Medical History:  Diagnosis Date   ASHD (arteriosclerotic heart disease)    BPH (benign prostatic hyperplasia)    Difficult intubation    anesthesia record 1999 / record on chart   Foley catheter in place 10/23/12   GERD (gastroesophageal reflux disease)    HH (hiatus hernia)    Hiatal hernia    HIV (human immunodeficiency virus infection) (HCC) dx'd 2014   Hyperlipidemia    Hypertension    Shingles 8/14   Type II diabetes mellitus (HCC)    pre diabetes   Social History    Socioeconomic History   Marital status: Married    Spouse name: Not on file   Number of children: 1   Years of education: Not on file   Highest education level: Not on file  Occupational History   Not on file  Tobacco Use   Smoking status: Former    Current packs/day: 0.12    Average packs/day: 0.1 packs/day for 40.0 years (4.8 ttl pk-yrs)    Types: Cigarettes   Smokeless tobacco: Never   Tobacco comments:    07/04/2013 I quit smoking before 2000  Vaping Use   Vaping status: Never Used  Substance and Sexual Activity   Alcohol use: No   Drug use: No   Sexual activity: Not Currently    Partners: Female    Comment: declined condoms  Other Topics Concern   Not on file  Social History Narrative   Not on file   Social Drivers of Health   Financial Resource Strain: Low Risk  (05/15/2024)   Overall Financial Resource Strain (CARDIA)    Difficulty of Paying Living Expenses: Not very hard  Food Insecurity: No Food Insecurity (06/12/2024)   Hunger Vital Sign    Worried About Running Out of Food in the Last Year: Never true    Ran Out of Food in the Last Year: Never true  Transportation Needs: No Transportation Needs (06/12/2024)   PRAPARE - Administrator, Civil Service (Medical): No    Lack of Transportation (Non-Medical): No  Physical Activity: Inactive (05/31/2023)   Exercise Vital Sign    Days of Exercise per Week: 0 days    Minutes of Exercise per Session: 0 min  Stress: No Stress Concern Present (05/31/2023)   [employer] of Occupational Health - Occupational Stress Questionnaire    Feeling of Stress : Not at all  Social Connections: Socially Integrated (06/12/2024)   Social Connection and Isolation Panel    Frequency of Communication with Friends and Family: Three times a week    Frequency of Social Gatherings with Friends and Family: Three times a week    Attends Religious Services: 1 to 4 times per year    Active Member of Clubs or Organizations:  Yes    Attends Engineer, structural: More than 4 times per year    Marital Status: Married   Family History  Problem Relation Age of Onset   Kidney disease Mother    Heart disease Father    Hypertension Brother    Liver disease Neg Hx    Cancer - Colon Neg Hx    Esophageal cancer Neg Hx    Scheduled Meds:  dolutegravir -lamiVUDine   1  tablet Oral Daily   magic mouthwash  10 mL Oral QID   mirabegron  ER  25 mg Oral Daily   pantoprazole   40 mg Oral Daily   sodium chloride  flush  3 mL Intravenous Q12H   Continuous Infusions:  dextrose  5 % and 0.9 % NaCl 40 mL/hr at 06/12/24 0034   potassium chloride  10 mEq (06/12/24 0841)   PRN Meds:.acetaminophen  **OR** acetaminophen , morphine  injection Allergies  Allergen Reactions   Peanut-Containing Drug Products Swelling   Review of Systems  Constitutional:  Positive for activity change, appetite change and unexpected weight change.    Physical Exam Vitals and nursing note reviewed.  Constitutional:      General: He is not in acute distress.    Appearance: He is ill-appearing.     Comments: Thin, frail   Cardiovascular:     Rate and Rhythm: Normal rate.  Pulmonary:     Effort: No tachypnea, accessory muscle usage or respiratory distress.  Abdominal:     General: Abdomen is flat.  Neurological:     Mental Status: He is confused.     Vital Signs: BP 119/74 (BP Location: Left Arm)   Pulse 78   Temp 97.8 F (36.6 C) (Oral)   Resp 19   Ht 5' 6 (1.676 m)   Wt 53.1 kg   SpO2 100%   BMI 18.88 kg/m  Pain Scale: 0-10   Pain Score: 0-No pain   SpO2: SpO2: 100 % O2 Device:SpO2: 100 % O2 Flow Rate: .   IO: Intake/output summary:  Intake/Output Summary (Last 24 hours) at 06/12/2024 0953 Last data filed at 06/12/2024 0900 Gross per 24 hour  Intake 640.26 ml  Output --  Net 640.26 ml    LBM: Last BM Date : 06/11/24 Baseline Weight: Weight: 53.1 kg Most recent weight: Weight: 53.1 kg     Palliative  Assessment/Data:    Time Total: 80 min  Greater than 50%  of this time was spent counseling and coordinating care related to the above assessment and plan.  Signed by: Bernarda Kitty, NP Palliative Medicine Team Pager # [phone] (M-F 8a-5p) Team Phone # [phone] (Nights/Weekends)

## 2024-06-12 NOTE — Progress Notes (Signed)
 [facility] Progress Note Patient: Jeremy Reeves FMW:[id number] DOB: [birth year]  DOA: 06/11/2024 DOS: the patient was seen and examined on 06/12/2024  Brief Hospital Course: Patient with PMH of HTN, HLD, CAD, CABG, chronic A-fib on Eliquis , type II DM, CKD 3A, HIV, dementia.  Presents the hospital with hypotension. Has been seeing PCP for poor p.o. intake, failure to thrive as well as pocketing his food. Was also seen by GI for the same. Assessment and Plan: Acute metabolic encephalopathy in the setting of AKI and hypotension. No focal deficit at the time of my evaluation. Will monitor for improvement in mentation. Check CT head.  B12 4000, B1 pending, TSH 2.63 Free T41.23, folate of 11.7, ammonia 44 No evidence of infection. PT OT also consulted.  Adult failure to thrive Suspect secondary to progressively worsening mentation. Currently on a dysphagia diet for now Will monitor for improvement in oral intake.  Dysphagia. SLP consulted. Currently on dysphagia 1 diet although patient can be D2 or D3 diet for the swallowing as well. SLP reported odynophagia.  Will initiate Magic mouthwash.  Hypotension. AKI on CKD 3B. Baseline serum creatinine appears to be around 1.5.  Serum creatinine was 1.8 on 8/13.  Likely in the setting of poor p.o. intake with hypovolemia. Lactic acid elevated.  Renal function elevated. Aggressively hydrating with IV fluid. Holding antihypertensive medications.  Chronic thrombocytopenia. Appears to be progressively worsening. No evidence of bleeding. No change in medication. Eliquis .  Will currently hold.  Entresto use. Wife reports that the patient is on Entresto although no etiology to utilize this medication. Wife thinks that the patient is on this medication per Dr. Wadie from cardiology although his note does not mention him Entresto.  Overactive bladder. On Myrbetriq . Will continue.  HIV. Recent HIV viral count was undetectable. CD4  count was adequate. Continuing home medication.  Goals of care conversation. Appreciate clinical care consultation. Patient is a DNR/DNI. No PEG tube.  Subjective: Mentation improving.  No nausea no vomiting.  No oral intake.  Physical Exam: Clear to auscultation. S1-S2 present Bowel sound present.  Nontender. Oral mucosa is dry. Trace edema of lower extremity. Alert, awake, able to follow commands, oriented to self only.  Data Reviewed: I have Reviewed nursing notes, Vitals, and Lab results. Since last encounter, pertinent lab results CBC and BMP   . I have ordered test including CBC and BMP  . I have discussed pt's care plan and test results with palliative care  .   Disposition: Status is: Inpatient Remains inpatient appropriate because: Monitor for improvement of her intake and electrolyte abnormality  Place and maintain sequential compression device Start: 06/12/24 1027   Family Communication: Discussed with wife at bedside Level of care: Telemetry Medical   Vitals:   06/12/24 0622 06/12/24 0630 06/12/24 0842 06/12/24 1648  BP: 97/67 91/66 119/74 (!) 102/90  Pulse: (!) 105 (!) 106 78 (!) 116  Resp:  16 19 18   Temp: 98.3 F (36.8 C) 98.1 F (36.7 C) 97.8 F (36.6 C) 97.6 F (36.4 C)  TempSrc: Oral Oral Oral Oral  SpO2: 99% 100% 100% (!) 87%  Weight:      Height:         Author: Yetta Blanch, MD 06/12/2024 6:26 PM  Please look on [email or web address] to find out who is on call.

## 2024-06-13 ENCOUNTER — Ambulatory Visit (HOSPITAL_COMMUNITY): Payer: MEDICARE | Attending: Nurse Practitioner

## 2024-06-13 DIAGNOSIS — R627 Adult failure to thrive: Secondary | ICD-10-CM | POA: Diagnosis not present

## 2024-06-13 LAB — BASIC METABOLIC PANEL WITH GFR
Anion gap: 5 (ref 5–15)
Anion gap: 7 (ref 5–15)
BUN: 13 mg/dL (ref 8–23)
BUN: 17 mg/dL (ref 8–23)
CO2: 27 mmol/L (ref 22–32)
CO2: 29 mmol/L (ref 22–32)
Calcium: 7.4 mg/dL — ABNORMAL LOW (ref 8.9–10.3)
Calcium: 7.8 mg/dL — ABNORMAL LOW (ref 8.9–10.3)
Chloride: 104 mmol/L (ref 98–111)
Chloride: 106 mmol/L (ref 98–111)
Creatinine, Ser: 1.36 mg/dL — ABNORMAL HIGH (ref 0.61–1.24)
Creatinine, Ser: 1.38 mg/dL — ABNORMAL HIGH (ref 0.61–1.24)
GFR, Estimated: 51 mL/min — ABNORMAL LOW (ref >60)
GFR, Estimated: 52 mL/min — ABNORMAL LOW (ref >60)
Glucose, Bld: 101 mg/dL — ABNORMAL HIGH (ref 70–99)
Glucose, Bld: 167 mg/dL — ABNORMAL HIGH (ref 70–99)
Potassium: 3.4 mmol/L — ABNORMAL LOW (ref 3.5–5.1)
Potassium: 3.6 mmol/L (ref 3.5–5.1)
Sodium: 138 mmol/L (ref 135–145)
Sodium: 140 mmol/L (ref 135–145)

## 2024-06-13 LAB — CBC
HCT: 32.7 % — ABNORMAL LOW (ref 39.0–52.0)
Hemoglobin: 11.5 g/dL — ABNORMAL LOW (ref 13.0–17.0)
MCH: 32.2 pg (ref 26.0–34.0)
MCHC: 35.2 g/dL (ref 30.0–36.0)
MCV: 91.6 fL (ref 80.0–100.0)
Platelets: 52 K/uL — ABNORMAL LOW (ref 150–400)
RBC: 3.57 MIL/uL — ABNORMAL LOW (ref 4.22–5.81)
RDW: 19.4 % — ABNORMAL HIGH (ref 11.5–15.5)
WBC: 4.6 K/uL (ref 4.0–10.5)
nRBC: 0.4 % — ABNORMAL HIGH (ref 0.0–0.2)

## 2024-06-13 LAB — MAGNESIUM: Magnesium: 1.2 mg/dL — ABNORMAL LOW (ref 1.7–2.4)

## 2024-06-13 LAB — LACTIC ACID, PLASMA: Lactic Acid, Venous: 3.5 mmol/L (ref 0.5–1.9)

## 2024-06-13 LAB — VITAMIN B12: Vitamin B-12: >4212 pg/mL — ABNORMAL HIGH (ref 180–914)

## 2024-06-13 LAB — PHOSPHORUS: Phosphorus: 1.3 mg/dL — ABNORMAL LOW (ref 2.5–4.6)

## 2024-06-13 MED ORDER — POTASSIUM CHLORIDE IN NACL 40-0.9 MEQ/L-% IV SOLN
INTRAVENOUS | Status: AC
  Filled 2024-06-13 (×2): qty 1000

## 2024-06-13 MED ORDER — SODIUM CHLORIDE 0.9 % IV SOLN
2.0000 g | Freq: Once | INTRAVENOUS | Status: AC
  Administered 2024-06-13: 2 g via INTRAVENOUS
  Filled 2024-06-13: qty 20

## 2024-06-13 NOTE — Progress Notes (Signed)
 Pt remains confused, awake all night. Wife remained bedside. Lactic acid elevated even after bolus. MD on call Dr. Marien Piety notified. New IVF and lab orders received. Pt voiding with no difficulty with condom cath on. UA sent, IV rocephin  initiated. Pt pocketed magic mouthwash and required much prompting to swallow even with wife's encouragement. Pt irritable with encouragement. Pt refused most PO intake overnight, wife states he only ate a few bites of dinner. Pt offered vanilla ice cream early in shift, was excited to get ice cream, after a couple of bites, pt irritated and refused ice cream. Pt's demeanor changes often with prolonged interaction.

## 2024-06-13 NOTE — Progress Notes (Signed)
 Progress Note   Patient: Jeremy Reeves FMW:[id number] DOB: 1941/09/12 DOA: 06/11/2024     1 DOS: the patient was seen and examined on 06/13/2024   Brief hospital course: 83yo with h/o HTN, HLD, CAD s/p CABG, chronic afib on Eliquis , T2DM, stage 3a CKD, HIV, and advanced dementia who presented on 8/26 with failure to thrive.  Palliative care and SLP consulted.   Assessment and Plan:  Failure to thrive Patient with advanced dementia and other medical problems who presented with acute metabolic encephalopathy in the setting of AKI and hypotension No focal deficits Mentation is improving No evidence of infection PT OT also consulted Wife appears to need additional support at home - she has a long-term care policy for him that she has not started to use yet and also is open to hospice to provide additional in-home resources   Dysphagia SLP consulted Currently on dysphagia 1 diet  SLP reported odynophagia - initiate Magic mouthwash   Hypotension Holding antihypertensive medications (carvedilol ) Appears to have resolved  AKI on CKD 3B Baseline serum creatinine appears to be around 1.5 Serum creatinine was 1.8 on 8/13 Likely in the setting of poor p.o. intake with hypovolemia Resolved and back to baseline Persistent lactic acidosis Stop Entresto - no apparent indication   Chronic thrombocytopenia Appears to be progressively worsening No evidence of bleeding No change in medication Eliquis  is currently on hold   Overactive bladder Continue Myrbetriq    HIV Recent HIV viral count was undetectable CD4 count was adequate Continue Dovato    Goals of care conversation Palliative care is consuling Patient is DNR/DNI His primary issue appears to be that his wife needs more support to effectively care for him at home Will consider in-home hospice, also has long-term care insurance so hopefully can arrange for [facility] and an aide/sitter   Consultants: Palliative  care PT OT SLP  Procedures: None  Antibiotics: Ceftriaxone  x 1 dose  30 Day Unplanned Readmission Risk Score    Flowsheet Row ED to Hosp-Admission (Current) from 06/11/2024 in [facility]  30 Day Unplanned Readmission Risk Score (%) 17.08 Filed at 06/13/2024 0801    This score is the patient's risk of an unplanned readmission within 30 days of being discharged (0 -100%). The score is based on dignosis, age, lab data, medications, orders, and past utilization.   Low:  0-14.9   Medium: 15-21.9   High: 22-29.9   Extreme: 30 and above           Subjective: Patient is pleasant and sitting up in the chair.  His wife reports that he is doing much better.   Objective: Vitals:   06/13/24 0855 06/13/24 1750  BP: 102/79 (!) 133/99  Pulse: (!) 106 (!) 106  Resp: 18 18  Temp: 98 F (36.7 C)   SpO2: 100% 100%    Intake/Output Summary (Last 24 hours) at 06/13/2024 1854 Last data filed at 06/13/2024 0700 Gross per 24 hour  Intake 1805.48 ml  Output --  Net 1805.48 ml   Filed Weights   06/11/24 1930  Weight: 53.1 kg    Exam:  General:  Appears calm and comfortable and is in NAD Eyes:  normal lids, iris ENT:  grossly normal hearing, lips & tongue, mmm Cardiovascular:  Irregularly irregular. No LE edema.  Respiratory:   CTA bilaterally with no wheezes/rales/rhonchi.  Normal respiratory effort. Abdomen:  soft, NT, ND Skin:  no rash or induration seen on limited exam Musculoskeletal:  grossly normal tone  BUE/BLE, good ROM, no bony abnormality Psychiatric:  pleasant mood and affect, speech confused but appropriate, AO x 1-2 Neurologic:  CN 2-12 grossly intact, moves all extremities in coordinated fashion  Data Reviewed: I have reviewed the patient's lab results since admission.  Pertinent labs for today include:   BUN 13/Creatinine 1.36/GFR 52, improving Phos 1.3 Mag++ 1.2 Lactate 3.1, 2.5, 2.8, 3.2, 3.5 WBC 4.6 Hgb 11.5 Platelets 52   Family  Communication: Wife was present  Disposition: Status is: Inpatient Remains inpatient appropriate because: ongoing management     Time spent: 50 minutes  Unresulted Labs (From admission, onward)     Start     Ordered   06/13/24 0500  Basic metabolic panel with GFR  Daily,   R     Question:  Specimen collection method  Answer:  Lab=Lab collect   06/12/24 0817   06/13/24 0500  CBC  Daily,   R     Question:  Specimen collection method  Answer:  Lab=Lab collect   06/12/24 0817   06/13/24 0500  Magnesium   Daily,   R     Question:  Specimen collection method  Answer:  Lab=Lab collect   06/12/24 0817   06/12/24 1500  Vitamin B1  Once-Timed,   TIMED       Question:  Specimen collection method  Answer:  Lab=Lab collect   06/12/24 1037             Author: Delon Herald, MD 06/13/2024 6:54 PM  For on call review [email or web address].

## 2024-06-13 NOTE — Progress Notes (Signed)
 Physical Therapy Treatment Patient Details Name: Jeremy Reeves MRN: [id number] DOB: 1941/09/01 Today's Date: 06/13/2024   History of Present Illness Pt is an 83 y.o. male who presented 06/11/24  from his infectious disease doctor's office for hypotension and failure to thrive. CT head negative for acute abnormalities. PMH: HTN, HLD, ischemic CAD s/p 3 vessel CABG 1999, afib on Eliquis , DM2, CKD stage IIIa, HIV, BPH, GERD, thrombocytopenia, dementia    PT Comments  Session limited due to pt's increasing agitation. On arrival, pt's chair alarm ringing with pt standing at foot of recliner (still raised) with wife holding onto him and trying to walk away despite stretched condom catheter and IV in LUE. Patient with imbalance requiring min assist to prevent fall due to blankets on the floor at his feet. Able to get him untangled and standing with CGA while trying to explain to pt need to sit back down. Ultimately nurse tech arrived and assisted with getting pt to sit down. Updated his chair alarm to lap belt system and pt calm and reclined upon departure.     If plan is discharge home, recommend the following: A little help with walking and/or transfers;A little help with bathing/dressing/bathroom;Assistance with cooking/housework;Direct supervision/assist for medications management;Direct supervision/assist for financial management;Assist for transportation;Supervision due to cognitive status   Can travel by private vehicle        Equipment Recommendations  BSC/3in1;Wheelchair (measurements PT);Wheelchair cushion (measurements PT);Hospital bed;Other (comment) (tub bench/shower chair)    Recommendations for Other Services       Precautions / Restrictions Precautions Precautions: Fall Recall of Precautions/Restrictions: Impaired Restrictions Weight Bearing Restrictions Per Provider Order: No     Mobility  Bed Mobility               General bed mobility comments: Pt OOB on my  arrival    Transfers Overall transfer level: Needs assistance Equipment used: None Transfers: Sit to/from Stand Sit to Stand: Contact guard assist           General transfer comment: standing with wife trying to get him to sit back down; condom catheter and IV stretched as pt standing at foot of recliner    Ambulation/Gait               General Gait Details: pt too agitated to safely ambulate   Stairs             Wheelchair Mobility     Tilt Bed    Modified Rankin (Stroke Patients Only) Modified Rankin (Stroke Patients Only) Pre-Morbid Rankin Score: Moderate disability Modified Rankin: Moderately severe disability     Balance Overall balance assessment: Needs assistance Sitting-balance support: No upper extremity supported, Feet supported Sitting balance-Leahy Scale: Fair     Standing balance support: Single extremity supported, No upper extremity supported, During functional activity Standing balance-Leahy Scale: Poor Standing balance comment: reliant on UE support and minA                            Communication Communication Communication: Impaired Factors Affecting Communication: Hearing impaired  Cognition Arousal: Alert Behavior During Therapy: Agitated   PT - Cognitive impairments: Orientation, Awareness, Memory, Attention, Initiation, Sequencing, Problem solving, Safety/Judgement   Orientation impairments: Time, Situation, Place                   PT - Cognition Comments: pt standing at foot of recliner with wife and alarm sounding on arrival; becoming agitated at  attempts to get him to return to sitting Following commands: Impaired Following commands impaired: Follows one step commands inconsistently, Follows one step commands with increased time    Cueing Cueing Techniques: Verbal cues, Tactile cues, Visual cues, Gestural cues  Exercises      General Comments General comments (skin integrity, edema, etc.): pt  agitated and not attending to wife's instructions to sit down due to lines being pulled (showed no awareness of lines); got pt untangled from blankets at his feet with min assist for balance and after standing for several minutes nurse tech was able to persuade pt to sit down. Exchanged chair alarm for belt alarm and pt reclined and calm at end of session      Pertinent Vitals/Pain Pain Assessment Pain Assessment: Faces Faces Pain Scale: No hurt    Home Living                          Prior Function            PT Goals (current goals can now be found in the care plan section) Acute Rehab PT Goals Patient Stated Goal: to improve and return home per wife Time For Goal Achievement: 06/26/24 Potential to Achieve Goals: Fair Progress towards PT goals: Not progressing toward goals - comment (agitated)    Frequency    Min 2X/week      PT Plan      Co-evaluation              AM-PAC PT 6 Clicks Mobility   Outcome Measure  Help needed turning from your back to your side while in a flat bed without using bedrails?: A Little Help needed moving from lying on your back to sitting on the side of a flat bed without using bedrails?: A Lot Help needed moving to and from a bed to a chair (including a wheelchair)?: A Little Help needed standing up from a chair using your arms (e.g., wheelchair or bedside chair)?: A Little Help needed to walk in hospital room?: A Little Help needed climbing 3-5 steps with a railing? : A Lot 6 Click Score: 16    End of Session   Activity Tolerance: Patient tolerated treatment well;Treatment limited secondary to agitation Patient left: in chair;with call bell/phone within reach;with chair alarm set;with family/visitor present;with nursing/sitter in room Nurse Communication: Mobility status;Other (comment) (lap belt chair alarm in place) PT Visit Diagnosis: Unsteadiness on feet (R26.81);Other abnormalities of gait and mobility  (R26.89);Muscle weakness (generalized) (M62.81);Difficulty in walking, not elsewhere classified (R26.2);History of falling (Z91.81);Adult, failure to thrive (R62.7)     Time: 1052-1101 PT Time Calculation (min) (ACUTE ONLY): 9 min  Charges:    $Therapeutic Activity: 8-22 mins PT General Charges $$ ACUTE PT VISIT: 1 Visit                      Macario RAMAN, PT Acute Rehabilitation Services  Office [phone]    Macario SHAUNNA Soja 06/13/2024, 11:12 AM

## 2024-06-14 ENCOUNTER — Other Ambulatory Visit: Payer: MEDICARE | Admitting: Licensed Clinical Social Worker

## 2024-06-14 DIAGNOSIS — R627 Adult failure to thrive: Secondary | ICD-10-CM | POA: Diagnosis not present

## 2024-06-14 DIAGNOSIS — N1832 Chronic kidney disease, stage 3b: Secondary | ICD-10-CM | POA: Diagnosis present

## 2024-06-14 DIAGNOSIS — R1310 Dysphagia, unspecified: Secondary | ICD-10-CM

## 2024-06-14 LAB — IGG: IgG (Immunoglobin G), Serum: 1685 mg/dL — ABNORMAL HIGH (ref 600–1540)

## 2024-06-14 LAB — HEPATITIS B SURFACE ANTIBODY,QUALITATIVE: Hep B S Ab: NONREACTIVE

## 2024-06-14 LAB — CBC
HCT: 34.2 % — ABNORMAL LOW (ref 39.0–52.0)
Hemoglobin: 11.8 g/dL — ABNORMAL LOW (ref 13.0–17.0)
MCH: 31.7 pg (ref 26.0–34.0)
MCHC: 34.5 g/dL (ref 30.0–36.0)
MCV: 91.9 fL (ref 80.0–100.0)
Platelets: 53 K/uL — ABNORMAL LOW (ref 150–400)
RBC: 3.72 MIL/uL — ABNORMAL LOW (ref 4.22–5.81)
RDW: 20.2 % — ABNORMAL HIGH (ref 11.5–15.5)
WBC: 4.1 K/uL (ref 4.0–10.5)
nRBC: 0 % (ref 0.0–0.2)

## 2024-06-14 LAB — MITOCHONDRIAL ANTIBODIES: Mitochondrial M2 Ab, IgG: <=20 U (ref <=20.0)

## 2024-06-14 LAB — MAGNESIUM: Magnesium: 1.2 mg/dL — ABNORMAL LOW (ref 1.7–2.4)

## 2024-06-14 LAB — HEPATITIS A ANTIBODY, TOTAL: Hepatitis A AB,Total: NONREACTIVE

## 2024-06-14 LAB — VITAMIN B1: Vitamin B1 (Thiamine): 102.8 nmol/L (ref 66.5–200.0)

## 2024-06-14 LAB — BASIC METABOLIC PANEL WITH GFR
Anion gap: 7 (ref 5–15)
BUN: 12 mg/dL (ref 8–23)
CO2: 26 mmol/L (ref 22–32)
Calcium: 7.8 mg/dL — ABNORMAL LOW (ref 8.9–10.3)
Chloride: 107 mmol/L (ref 98–111)
Creatinine, Ser: 1.2 mg/dL (ref 0.61–1.24)
GFR, Estimated: >60 mL/min (ref >60)
Glucose, Bld: 91 mg/dL (ref 70–99)
Potassium: 4.2 mmol/L (ref 3.5–5.1)
Sodium: 140 mmol/L (ref 135–145)

## 2024-06-14 LAB — ANTI-SMOOTH MUSCLE ANTIBODY, IGG: Actin (Smooth Muscle) Antibody (IGG): <20 U (ref <20)

## 2024-06-14 LAB — HEPATITIS C ANTIBODY: Hepatitis C Ab: NONREACTIVE

## 2024-06-14 LAB — ANA: Anti Nuclear Antibody (ANA): NEGATIVE

## 2024-06-14 LAB — HEPATITIS B SURFACE ANTIGEN: Hepatitis B Surface Ag: NONREACTIVE

## 2024-06-14 LAB — ALPHA-1-ANTITRYPSIN: A-1 Antitrypsin, Ser: 121 mg/dL (ref 83–199)

## 2024-06-14 MED ORDER — ENSURE PLUS HIGH PROTEIN PO LIQD: 237.0000 mL | Freq: Three times a day (TID) | ORAL | 0 refills | Status: AC

## 2024-06-14 MED ORDER — MAGIC MOUTHWASH: 10.0000 mL | Freq: Four times a day (QID) | ORAL | 0 refills | Status: AC

## 2024-06-14 NOTE — Progress Notes (Signed)
 DISCHARGE NOTE HOME GLEB MCGUIRE to be discharged Home per MD order. Discussed prescriptions and follow up appointments with the patient. Prescriptions given to patient; medication list explained in detail. Patient verbalized understanding.  Skin clean, dry and intact without evidence of skin break down, no evidence of skin tears noted. IV catheter discontinued intact. Site without signs and symptoms of complications. Dressing and pressure applied. Pt denies pain at the site currently. No complaints noted.  Patient free of lines, drains, and wounds.   An After Visit Summary (AVS) was printed and given to the patient. Patient escorted via wheelchair, and discharged home via private auto.  Aryeh Butterfield A Proctor-Gann, RN

## 2024-06-14 NOTE — TOC Transition Note (Signed)
 Transition of Care [facility]) - Discharge Note   Patient Details  Name: Jeremy Reeves MRN: [id number] Date of Birth: Apr 11, 1941  Transition of [facility]) CM/SW Contact:  Tom-Johnson, Joneric Streight Daphne, RN Phone Number: 06/14/2024, 12:15 PM   Clinical Narrative:     Patient is scheduled for discharge today.  Readmission Risk Assessment done. Hospice info, Outpatient f/u, hospital f/u and discharge instructions on AVS. Wife, [name] at bedside and will transport at discharge.  No further ICM needs noted.        Final next level of care: Home w Hospice Care Barriers to Discharge: Barriers Resolved   Patient Goals and CMS Choice Patient states their goals for this hospitalization and ongoing recovery are:: To return home CMS Medicare.gov Compare Post Acute Care list provided to:: Patient Choice offered to / list presented to : Patient, Spouse      Discharge Placement                Patient to be transferred to facility by: Wife Name of family member notified: [facility]    Discharge Plan and Services Additional resources added to the After Visit Summary for                            [facility] Arranged: PT, OT HH Agency: [facility] Date [facility] Agency Contacted: 06/12/24 Time HH Agency Contacted: 1540 Representative spoke with at [facility] [address] Agency: Darleene  Social Drivers of Health (SDOH) Interventions SDOH Screenings   Food Insecurity: No Food Insecurity (06/12/2024)  Housing: Low Risk  (06/12/2024)  Transportation Needs: No Transportation Needs (06/12/2024)  Utilities: Not At Risk (06/12/2024)  Alcohol Screen: Low Risk  (05/31/2023)  Depression (PHQ2-9): High Risk (06/08/2024)  Financial Resource Strain: Low Risk  (05/15/2024)  Physical Activity: Inactive (05/31/2023)  Social Connections: Socially Integrated (06/12/2024)  Stress: No Stress Concern Present (05/31/2023)  Tobacco Use: Medium Risk (06/11/2024)  Health Literacy: Adequate Health Literacy (05/31/2023)      Readmission Risk Interventions    06/12/2024    3:43 PM  Readmission Risk Prevention Plan  Transportation Screening Complete  PCP or Specialist Appt within 5-7 Days Complete  Home Care Screening Complete  Medication Review (RN CM) Referral to Pharmacy

## 2024-06-14 NOTE — Plan of Care (Signed)
  Problem: Pain Managment: Goal: General experience of comfort will improve and/or be controlled Outcome: Progressing   Problem: Education: Goal: Knowledge of General Education information will improve Description: Including pain rating scale, medication(s)/side effects and non-pharmacologic comfort measures Outcome: Progressing   Problem: Health Behavior/Discharge Planning: Goal: Ability to manage health-related needs will improve Outcome: Progressing   Problem: Clinical Measurements: Goal: Ability to maintain clinical measurements within normal limits will improve Outcome: Progressing Goal: Diagnostic test results will improve Outcome: Progressing   Problem: Nutrition: Goal: Adequate nutrition will be maintained Outcome: Progressing   Problem: Coping: Goal: Level of anxiety will decrease Outcome: Progressing   Problem: Elimination: Goal: Will not experience complications related to bowel motility Outcome: Progressing Goal: Will not experience complications related to urinary retention Outcome: Progressing   Problem: Safety: Goal: Ability to remain free from injury will improve Outcome: Progressing   Problem: Skin Integrity: Goal: Risk for impaired skin integrity will decrease Outcome: Progressing

## 2024-06-14 NOTE — Progress Notes (Signed)
 Palliative:  HPI:  83 y.o. male  with past medical history of cognitive impairment, HTN, HLD, ischemic CAD s/p 3 vessel CABG, atrial fibrillation on Eliquis , diabetes mellitus type 2, CKD stage 3a, HIV positive, BPH, thrombocytopenia, GERD, rectal polyp admitted on 06/11/2024 with weight loss and failure to thrive.   I met today at Jeremy Reeves bedside with wife, Jeremy Reeves. We reviewed his progress and decline. Jeremy Reeves is tearful but realistic. We reviewed plans for care and poor prognosis. She would like to take him home. We discussed hospice support and home and what this looks like and entails. She agrees with having help from hospice. I spent time explaining the difference in his care needs and how to manage at home. Jeremy Reeves is tearful and grieving but is aware of his poor prognosis. She wants him to be comfortable and wishes to get him home sooner than later. Discussed with OT and asked them to work with him to educate wife on how to manage and care for him at home so she can see what he can and cannot do and how to help care for him.   All questions/concerns addressed. Therapeutic listening provided. Emotional support provided during difficult decision making conversation.   Exam: Ill-appearing. Thin, frail. Confused. Lethargic. Worsening nighttime confusion/agitation. No distress. Breathing regular, unlabored. Abd soft.   Plan: - DNR - Home with hospice and comfort care  30 min  Bernarda Kitty, NP Palliative Medicine Team Pager [phone] (Please see [email or web address] for schedule) Team Phone [phone]

## 2024-06-14 NOTE — Progress Notes (Signed)
 Physical Therapy Treatment Patient Details Name: Jeremy Reeves MRN: [id number] DOB: [birth year] Today's Date: 06/14/2024   History of Present Illness Pt is an 83 y.o. male who presented 06/11/24  from his infectious disease doctor's office for hypotension and failure to thrive. CT head negative for acute abnormalities. PMH: HTN, HLD, ischemic CAD s/p 3 vessel CABG 1999, afib on Eliquis , DM2, CKD stage IIIa, HIV, BPH, GERD, thrombocytopenia, dementia    PT Comments  The pt continues to be confused and has difficulty initiating and following simple cues. His wife had to provide repeated cues and guidance and assistance to initiate all mobility this date. Educated pt's wife to allow pt to do as much for himself as possible to reduce strain on her. Encouraged her to lift through her legs rather than her back to prevent injury. Educated wife to use her L UE to wrap around his posterior trunk and hold underneath on the gait belt to guard him and provide him with better assistance for balance when ambulating. Educated wife to stand to his R when ambulating due to his tendency to sway and stagger to his R. She demonstrated and verbalized understanding. Will continue to follow acutely.     If plan is discharge home, recommend the following: A little help with walking and/or transfers;A little help with bathing/dressing/bathroom;Assistance with cooking/housework;Direct supervision/assist for medications management;Direct supervision/assist for financial management;Assist for transportation;Supervision due to cognitive status   Can travel by private vehicle        Equipment Recommendations  BSC/3in1;Wheelchair (measurements PT);Wheelchair cushion (measurements PT);Hospital bed;Other (comment) (tub bench/shower chair; RW if pt will actually use it)    Recommendations for Other Services       Precautions / Restrictions Precautions Precautions: Fall Recall of Precautions/Restrictions:  Impaired Restrictions Weight Bearing Restrictions Per Provider Order: No     Mobility  Bed Mobility               General bed mobility comments: Pt sitting up in recliner upon arrival and at end of session.    Transfers Overall transfer level: Needs assistance Equipment used: None Transfers: Sit to/from Stand Sit to Stand: Min assist           General transfer comment: Pt's wife demonstrated how she assists pt to stand with an anterior approach, cuing him for hand placement and providing what appeared to be minA to initiate transfer and to extend his hips to improve his upright posture and provide him with balance.    Ambulation/Gait Ambulation/Gait assistance: Min assist, +2 safety/equipment, +2 physical assistance Gait Distance (Feet): 60 Feet Assistive device: None, 1 person hand held assist Gait Pattern/deviations: Step-through pattern, Decreased step length - right, Decreased step length - left, Decreased stride length, Staggering right, Leaning posteriorly Gait velocity: reduced Gait velocity interpretation: <1.31 ft/sec, indicative of household ambulator   General Gait Details: Pt takes slow, small, unsteady steps, often leaning posteriorly and to the R, intermittently staggering R. Wife demonstrated how she guards pt and provides him with intermittent R HHA to guide him. Educated wife to use her L UE to wrap around his posterior trunk and hold underneath on the gait belt to guard him and provide him with better assistance for balance when ambulating. Educated wife to stand to his R when ambulating due to his tendency to sway and stagger to his R. She demonstrated and verbalized understanding. +2 assist provided by PT towards end of gait belt as pt fatigued and began to lean posteriorly more.  Stairs             Wheelchair Mobility     Tilt Bed    Modified Rankin (Stroke Patients Only) Modified Rankin (Stroke Patients Only) Pre-Morbid Rankin Score:  Moderate disability Modified Rankin: Moderately severe disability     Balance Overall balance assessment: Needs assistance Sitting-balance support: No upper extremity supported, Feet supported Sitting balance-Leahy Scale: Fair     Standing balance support: Single extremity supported, No upper extremity supported, During functional activity Standing balance-Leahy Scale: Poor Standing balance comment: reliant on UE support and minA                            Communication Communication Communication: Impaired Factors Affecting Communication: Hearing impaired  Cognition Arousal: Alert Behavior During Therapy: WFL for tasks assessed/performed   PT - Cognitive impairments: Awareness, Memory, Attention, Initiation, Sequencing, Problem solving, Safety/Judgement                       PT - Cognition Comments: Pt often just stating what? or you drink it to his wife,even though repeatedly cued to drink the ensure in his hand. Pt with poor awareness and inconsistent in following simple cues, needing extra time and assistance to initiate. Following commands: Impaired Following commands impaired: Follows one step commands inconsistently, Follows one step commands with increased time    Cueing Cueing Techniques: Verbal cues, Tactile cues, Visual cues, Gestural cues  Exercises      General Comments General comments (skin integrity, edema, etc.): Educated pt's wife to allow pt to do as much for himself as possible to reduce strain on her. Encouraged her to lift through her legs rather than her back to prevent injury. She verbalized understanding.      Pertinent Vitals/Pain Pain Assessment Pain Assessment: Faces Faces Pain Scale: No hurt Pain Intervention(s): Monitored during session    Home Living                          Prior Function            PT Goals (current goals can now be found in the care plan section) Acute Rehab PT Goals Patient Stated  Goal: to improve and return home per wife PT Goal Formulation: With patient/family Time For Goal Achievement: 06/26/24 Potential to Achieve Goals: Fair Progress towards PT goals: Progressing toward goals    Frequency    Min 2X/week      PT Plan      Co-evaluation              AM-PAC PT 6 Clicks Mobility   Outcome Measure  Help needed turning from your back to your side while in a flat bed without using bedrails?: A Little Help needed moving from lying on your back to sitting on the side of a flat bed without using bedrails?: A Lot Help needed moving to and from a bed to a chair (including a wheelchair)?: A Little Help needed standing up from a chair using your arms (e.g., wheelchair or bedside chair)?: A Little Help needed to walk in hospital room?: A Little Help needed climbing 3-5 steps with a railing? : A Lot 6 Click Score: 16    End of Session Equipment Utilized During Treatment: Gait belt Activity Tolerance: Patient tolerated treatment well Patient left: in chair;with call bell/phone within reach;with chair alarm set;with family/visitor present (posey alarm belt) Nurse Communication:  Mobility status PT Visit Diagnosis: Unsteadiness on feet (R26.81);Other abnormalities of gait and mobility (R26.89);Muscle weakness (generalized) (M62.81);Difficulty in walking, not elsewhere classified (R26.2);History of falling (Z91.81);Adult, failure to thrive (R62.7)     Time: 8885-8867 PT Time Calculation (min) (ACUTE ONLY): 18 min  Charges:    $Therapeutic Activity: 8-22 mins PT General Charges $$ ACUTE PT VISIT: 1 Visit                     Theo Ferretti, PT, DPT Acute Rehabilitation Services  Office: [phone]    Theo CHRISTELLA Ferretti 06/14/2024, 11:41 AM

## 2024-06-14 NOTE — Progress Notes (Signed)
 Occupational Therapy Treatment Patient Details Name: Jeremy Reeves MRN: [id number] DOB: 05/30/1941 Today's Date: 06/14/2024   History of present illness Pt is an 83 y.o. male who presented 06/11/24  from his infectious disease doctor's office for hypotension and failure to thrive. CT head negative for acute abnormalities. PMH: HTN, HLD, ischemic CAD s/p 3 vessel CABG 1999, afib on Eliquis , DM2, CKD stage IIIa, HIV, BPH, GERD, thrombocytopenia, dementia   OT comments  Patient seen with wife on this date to address self care and simulate home environment to prepare for discharge home. Gait belt provided to wife and instructed on use.  Patient's wife provided cues and assistance for bed mobility, ambulating to bathroom, and bathing/dressing. Patient's wife was provided education on use of gait belt for support with mobility and standing balance and assistance to provided to prevent caregiver injury.  Patient's wife was appreciative of education and gait belt.  Patient's wife states that patient is basically baseline for the level of assistance she had to provided.  Discharge recommendations continue to be appropriate.        If plan is discharge home, recommend the following:  A little help with walking and/or transfers;A lot of help with bathing/dressing/bathroom;Assistance with cooking/housework;Assist for transportation;Direct supervision/assist for medications management;Direct supervision/assist for financial management;Supervision due to cognitive status;Help with stairs or ramp for entrance   Equipment Recommendations  None recommended by OT    Recommendations for Other Services      Precautions / Restrictions Precautions Precautions: Fall Recall of Precautions/Restrictions: Impaired Restrictions Weight Bearing Restrictions Per Provider Order: No       Mobility Bed Mobility Overal bed mobility: Needs Assistance Bed Mobility: Supine to Sit     Supine to sit: Mod assist, HOB  elevated     General bed mobility comments: patient's wife providing cues and assistance to get patient to EOB, COTA provided education to assist patient and prevent caregiver injury    Transfers Overall transfer level: Needs assistance Equipment used: None Transfers: Sit to/from Stand, Bed to chair/wheelchair/BSC Sit to Stand: Min assist     Step pivot transfers: Min assist     General transfer comment: patient's wife was provided education on gait belt use and assistance and cues to provided to allow patient to participate more and prevent caregiver injury     Balance Overall balance assessment: Needs assistance Sitting-balance support: No upper extremity supported, Feet supported Sitting balance-Leahy Scale: Fair Sitting balance - Comments: supervision for safety on EOB   Standing balance support: Single extremity supported, No upper extremity supported, During functional activity Standing balance-Leahy Scale: Poor Standing balance comment: reliant on UE support and minA                           ADL either performed or assessed with clinical judgement   ADL Overall ADL's : Needs assistance/impaired     Grooming: Wash/dry hands;Wash/dry face;Oral care;Minimal assistance;Sitting   Upper Body Bathing: Minimal assistance;Sitting   Lower Body Bathing: Moderate assistance;Sit to/from stand   Upper Body Dressing : Minimal assistance;Sitting   Lower Body Dressing: Moderate assistance;Sit to/from stand   Toilet Transfer: Minimal assistance;Ambulation   Toileting- Clothing Manipulation and Hygiene: Maximal assistance;Sit to/from stand       Functional mobility during ADLs: Minimal assistance General ADL Comments: self care performed with wife to simulate home environment. Patient's wife states that patient is basically baseline    Extremity/Trunk Assessment  Vision       Perception     Praxis     Communication  Communication Communication: Impaired Factors Affecting Communication: Hearing impaired   Cognition Arousal: Alert Behavior During Therapy: Flat affect, Anxious Cognition: History of cognitive impairments             OT - Cognition Comments: hx of dementia, follows commands from wife better than from staff with increased time                 Following commands: Impaired Following commands impaired: Follows one step commands inconsistently, Follows one step commands with increased time      Cueing   Cueing Techniques: Verbal cues, Tactile cues, Visual cues, Gestural cues  Exercises      Shoulder Instructions       General Comments patient seen with wife to provided caregiver training and prepare for possible discharge home today.    Pertinent Vitals/ Pain       Pain Assessment Pain Assessment: Faces Faces Pain Scale: No hurt Pain Intervention(s): Monitored during session  Home Living                                          Prior Functioning/Environment              Frequency  Min 2X/week        Progress Toward Goals  OT Goals(current goals can now be found in the care plan section)  Progress towards OT goals: Progressing toward goals  Acute Rehab OT Goals Patient Stated Goal: to go home OT Goal Formulation: With family Time For Goal Achievement: 06/26/24 Potential to Achieve Goals: Good ADL Goals Pt Will Perform Grooming: with supervision Pt Will Perform Lower Body Dressing: with supervision Pt Will Transfer to Toilet: with supervision Additional ADL Goal #1: pt will complete functional mobility in hospital environment with supervision A to demonstrate low fall risk  Plan      Co-evaluation                 AM-PAC OT 6 Clicks Daily Activity     Outcome Measure   Help from another person eating meals?: A Little Help from another person taking care of personal grooming?: A Little Help from another person  toileting, which includes using toliet, bedpan, or urinal?: A Little Help from another person bathing (including washing, rinsing, drying)?: A Lot Help from another person to put on and taking off regular upper body clothing?: A Little Help from another person to put on and taking off regular lower body clothing?: A Lot 6 Click Score: 16    End of Session Equipment Utilized During Treatment: Gait belt  OT Visit Diagnosis: Unsteadiness on feet (R26.81);Other abnormalities of gait and mobility (R26.89);Muscle weakness (generalized) (M62.81);History of falling (Z91.81)   Activity Tolerance Patient tolerated treatment well   Patient Left in chair;with call bell/phone within reach;with chair alarm set;with family/visitor present   Nurse Communication Mobility status        Time: 9041-8961 OT Time Calculation (min): 40 min  Charges: OT General Charges $OT Visit: 1 Visit OT Treatments $Self Care/Home Management : 38-52 mins  Jeremy Reeves, OTA Acute Rehabilitation Services  Office [phone]   Jeremy Reeves 06/14/2024, 11:27 AM

## 2024-06-14 NOTE — Discharge Summary (Signed)
 Physician Discharge Summary   Patient: Jeremy Reeves MRN: [id number] DOB: 05-Dec-1940  Admit date:     06/11/2024  Discharge date: 06/14/24  Discharge Physician: Delon Herald   PCP: [name], MD   Recommendations at discharge:   You are being discharged to home with home hospice Bedside commode and wheelchair have been ordered Continue to hold carvedilol  for now due to low-normal blood pressures Stop niacin  and Entresto (no longer needed) Follow up with Dr. Joyce or with hospice physician (whichever you prefer) Dysphagia 1 diet as tolerated with thin liquids Hold Eliquis  for now (low platelets)  Discharge Diagnoses: Principal Problem:   Failure to thrive in adult Active Problems:   Hypertension associated with diabetes (HCC)   HIV disease (HCC)   OAB (overactive bladder)   Chronic kidney disease, stage 3b (HCC)   Dysphagia    Hospital Course: 83yo with h/o HTN, HLD, CAD s/p CABG, chronic afib on Eliquis , T2DM, stage 3a CKD, HIV, and advanced dementia who presented on 8/26 with failure to thrive.  Palliative care and SLP consulted.   Assessment and Plan:  Failure to thrive Patient with advanced dementia and other medical problems who presented with acute metabolic encephalopathy in the setting of AKI and hypotension No focal deficits Mentation is improving No evidence of infection PT OT also consulted Wife appears to need additional support at home - she has a long-term care policy for him that she has not started to use yet and also is open to hospice to provide additional in-home resources   Dysphagia SLP consulted Currently on dysphagia 1 diet  SLP reported odynophagia - initiate Magic mouthwash   Hypotension Holding antihypertensive medications (carvedilol , Entresto) Appears to have resolved   CKD 3B Baseline serum creatinine appears to be around 1.5 Serum creatinine was 1.8 on 8/13 Likely in the setting of poor p.o. intake with  hypovolemia Resolved and back to baseline Persistent lactic acidosis Stop Entresto - no apparent indication   Chronic thrombocytopenia Appears to be progressively worsening No evidence of bleeding No change in medication Eliquis  is currently on hold   Overactive bladder Continue Myrbetriq    HIV Recent HIV viral count was undetectable CD4 count was adequate Continue Dovato    Goals of care conversation Palliative care is consuling Patient is DNR/DNI His primary issue appears to be that his wife needs more support to effectively care for him at home Wife is eager to take him home today with in-home hospice, also has long-term care insurance so hopefully can arrange for [facility] and an aide/sitter     Consultants: Palliative care PT OT SLP   Procedures: None   Antibiotics: Ceftriaxone  x 1 dose    Pain control - [place]  Controlled Substance Reporting System database was reviewed. and patient was instructed, not to drive, operate heavy machinery, perform activities at heights, swimming or participation in water activities or provide baby-sitting services while on Pain, Sleep and Anxiety Medications; until their outpatient Physician has advised to do so again. Also recommended to not to take more than prescribed Pain, Sleep and Anxiety Medications.   Disposition: Home with hospice Diet recommendation:  Dysphagia type 1 thin Liquid DISCHARGE MEDICATION: Allergies as of 06/14/2024       Reactions   Peanut-containing Drug Products Swelling        Medication List     PAUSE taking these medications    carvedilol  6.25 MG tablet Wait to take this until your doctor or other care provider tells you to  start again. Commonly known as: COREG  TAKE 1 TABLET TWICE DAILY WITH MEALS   Eliquis  2.5 MG Tabs tablet Wait to take this until your doctor or other care provider tells you to start again. Generic drug: apixaban  TAKE 1 TABLET TWICE DAILY       STOP taking these  medications    ENTRESTO PO   niacin  1000 MG CR tablet Commonly known as: NIASPAN        TAKE these medications    Cinnamon 500 MG capsule Take 1,000 mg by mouth daily.   Cranberry 360 MG Caps Take 1 capsule by mouth daily.   cyanocobalamin 1000 MCG tablet Commonly known as: VITAMIN B12 Take 1,000 mcg by mouth daily.   Dovato  50-300 MG tablet Generic drug: dolutegravir -lamiVUDine  Take 1 tablet by mouth daily. What changed: additional instructions   feeding supplement Liqd Take 237 mLs by mouth 3 (three) times daily between meals.   GERITOL TONIC PO Take 1 Capful by mouth daily at 12 noon.   ketoconazole  2 % cream Commonly known as: NIZORAL  Apply 1 Application topically daily. Apply 1gm between affected toes once daily   magic mouthwash Soln Take 10 mLs by mouth 4 (four) times daily for 7 days. Suspension contains equal amounts of Maalox Extra Strength, nystatin, and diphenhydramine.   mirabegron  ER 25 MG Tb24 tablet Commonly known as: Myrbetriq  Take 1 tablet (25 mg total) by mouth daily.   omeprazole  20 MG capsule Commonly known as: PRILOSEC TAKE 1 CAPSULE EVERY DAY   vitamin E 180 MG (400 UNITS) capsule Take 400 Units by mouth daily.               Durable Medical Equipment  (From admission, onward)           Start     Ordered   06/14/24 1141  DME 3-in-1  Once        06/14/24 1142   06/14/24 1141  DME standard manual wheelchair with seat cushion  Once       Comments: Patient suffers from advanced dementia which impairs their ability to perform daily activities like bathing, dressing, feeding, grooming, and toileting in the home.  A walker will not resolve issue with performing activities of daily living. A wheelchair will allow patient to safely perform daily activities. Patient can safely propel the wheelchair in the home or has a caregiver who can provide assistance. Length of need Lifetime. Accessories: elevating leg rests (ELRs), wheel locks,  extensions and anti-tippers.   06/14/24 1142            Follow-up Information     Care, [facility] Follow up.   Specialty: Home Health Services Why: Someone will call you to schedule first home visit. Contact information: [address] [place] [address] [phone]                Discharge Exam:   Subjective: Sleeping today - he was up last night with urinary/stool incontinence and so did not sleep well.  Wife is eager to go home with hospice today.   Objective: Vitals:   06/14/24 0450 06/14/24 0834  BP: 98/75 103/81  Pulse: 100 95  Resp: 18 18  Temp: 97.6 F (36.4 C) 98.1 F (36.7 C)  SpO2: 100% 100%    Intake/Output Summary (Last 24 hours) at 06/14/2024 1145 Last data filed at 06/14/2024 0900 Gross per 24 hour  Intake 1171.12 ml  Output 800 ml  Net 371.12 ml   [employer]  06/11/24 1930  Weight: 53.1 kg    Exam:  General:  Appears calm and comfortable and is in NAD Eyes:  normal lids, iris ENT:  grossly normal hearing, lips & tongue, mmm Cardiovascular:  Irregularly irregular. No LE edema.  Respiratory:   CTA bilaterally with no wheezes/rales/rhonchi.  Normal respiratory effort. Abdomen:  soft, NT, ND Skin:  no rash or induration seen on limited exam Musculoskeletal:  grossly normal tone BUE/BLE, good ROM, no bony abnormality Psychiatric:  somnolent mood and affect, speech sparse today Neurologic:  unable to effectively perform today  Data Reviewed: I have reviewed the patient's lab results since admission.  Pertinent labs for today include:  Stable BMP WBC 4.1 Hgb 11.8 Platelets 53, stable    Condition at discharge: fair  The results of significant diagnostics from this hospitalization (including imaging, microbiology, ancillary and laboratory) are listed below for reference.   Imaging Studies: CT HEAD WO CONTRAST ( ) Result Date: 06/12/2024 CLINICAL DATA:  Initial evaluation for acute mental status  change, unknown cause. EXAM: CT HEAD WITHOUT CONTRAST TECHNIQUE: Contiguous axial images were obtained from the base of the skull through the vertex without intravenous contrast. RADIATION DOSE REDUCTION: This exam was performed according to the departmental dose-optimization program which includes automated exposure control, adjustment of the mA and/or kV according to patient size and/or use of iterative reconstruction technique. COMPARISON:  CT from 04/26/2024 FINDINGS: Brain: Examination degraded by motion artifact. Generalized age-related cerebral atrophy with chronic microvascular ischemic disease. No acute intracranial hemorrhage. No acute large vessel territory infarct. No mass lesion or midline shift. Diffuse ventricular prominence related to underlying atrophy without hydrocephalus, stable is. No extra-axial fluid collection. Vascular: No abnormal hyperdense vessel. Calcified atherosclerosis present at the skull base. Skull: Scalp soft tissues demonstrate no acute finding. Lipoma noted at the left suboccipital region. Calvarium intact. Sinuses/Orbits: Globes and orbital soft tissues within normal limits. Paranasal sinuses and mastoid air cells are largely are largely clear. Trace right mastoid effusion noted, bowel significance. Other: None. IMPRESSION: 1. No acute intracranial abnormality. 2. Generalized age-related cerebral atrophy with chronic microvascular ischemic disease. Electronically Signed   By: Morene Hoard M.D.   On: 06/12/2024 21:14   CT CHEST ABDOMEN PELVIS W CONTRAST Result Date: 06/11/2024 CLINICAL DATA:  Abdominal pain, dysphagia. EXAM: CT CHEST, ABDOMEN, AND PELVIS WITH CONTRAST TECHNIQUE: Multidetector CT imaging of the chest, abdomen and pelvis was performed following the standard protocol during bolus administration of intravenous contrast. RADIATION DOSE REDUCTION: This exam was performed according to the departmental dose-optimization program which includes automated  exposure control, adjustment of the mA and/or kV according to patient size and/or use of iterative reconstruction technique. CONTRAST:  80mL OMNIPAQUE  IOHEXOL  350 MG/ML SOLN COMPARISON:  None Available. FINDINGS: CT CHEST FINDINGS Cardiovascular: Atherosclerotic calcification of the aorta, aortic valve and coronary arteries. Heart is at the upper limits of normal in size to mildly enlarged. No pericardial effusion. Mediastinum/Nodes: No pathologically enlarged mediastinal, hilar or axillary lymph nodes. Esophagus is grossly unremarkable. Lungs/Pleura: Lungs are clear. No pleural fluid. Airway is unremarkable. Musculoskeletal: Degenerative changes in the spine. CT ABDOMEN PELVIS FINDINGS Hepatobiliary: Image quality is degraded by streak artifact from the patient's arms. Liver and gallbladder are grossly unremarkable. Pancreas: Grossly unremarkable. Spleen: Grossly unremarkable. Adrenals/Urinary Tract: Adrenal glands are unremarkable. Small low-attenuation lesions in the kidneys. No specific follow-up necessary. Small bilateral renal stones. Ureters are decompressed. Bladder is relatively low in volume. Stomach/Bowel: Stomach, small bowel, appendix and colon are unremarkable. Vascular/Lymphatic: Atherosclerotic calcification of the  aorta. 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 No pathologically enlarged lymph nodes. Reproductive: Prostate is minimally prominent. Other: No free fluid. Musculoskeletal: Osteopenia.  Degenerative changes in the spine. IMPRESSION: 1. No acute findings to explain the patient's symptoms. 2. Bilateral renal stones. 3. Mildly prominent prostate. 4. Aortic atherosclerosis (ICD10-I70.0). Coronary artery calcification. 5. 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 Electronically Signed   By: Newell Eke M.D.   On: 06/11/2024 14:59   CT Soft Tissue Neck W Contrast Result Date: 06/11/2024 EXAM: CT NECK WITH CONTRAST 06/11/2024 02:08:36 PM TECHNIQUE: CT of  the neck was performed with the administration of intravenous contrast. Multiplanar reformatted images are provided for review. Automated exposure control, iterative reconstruction, and/or weight based adjustment of the mA/kV was utilized to reduce the radiation dose to as low as reasonably achievable. COMPARISON: None available. CLINICAL HISTORY: Dysphagia. FINDINGS: AERODIGESTIVE TRACT: No discrete mass. No edema. SALIVARY GLANDS: The parotid and submandibular glands are unremarkable. THYROID : Unremarkable. LYMPH NODES: No suspicious cervical lymphadenopathy. SOFT TISSUES: 3.5 x 2.8 cm lipoma in the left lateral suboccipital region. BRAIN, ORBITS, SINUSES AND MASTOIDS: Partially visualized bilateral temporal lobe volume loss. Unremarkable orbits. Paranasal sinuses and mastoid air cells are clear. LUNGS AND MEDIASTINUM: More fully evaluated on today's separately reported CT of the chest, abdomen, and pelvis. BONES: No destructive bone lesion. Multilevel cervical disc degeneration, most advanced at C3-4 and C5-6. VASCULATURE: Moderate atherosclerosis at the carotid bifurcations without evidence of a high-grade stenosis. IMPRESSION: 1. No acute abnormality or suspicious neck mass. 2. Lipoma in the posterior left upper neck. Electronically signed by: Dasie Hamburg MD 06/11/2024 02:48 PM EDT RP Workstation: [id number]   DG Chest Portable 1 View Result Date: 06/11/2024 CLINICAL DATA:  Weakness EXAM: PORTABLE CHEST 1 VIEW COMPARISON:  04/26/2024 FINDINGS: Prior CABG. Heart and mediastinal contours are within normal limits. No focal opacities or effusions. No acute bony abnormality. No pneumothorax. IMPRESSION: No active disease. Electronically Signed   By: Franky Crease M.D.   On: 06/11/2024 14:11    Microbiology: Results for orders placed or performed during the hospital encounter of 06/11/24  Blood culture (routine x 2)     Status: None (Preliminary result)   Collection Time: 06/11/24 12:15 PM   Specimen: BLOOD  LEFT FOREARM  Result Value Ref Range Status   Specimen Description BLOOD LEFT FOREARM  Final   Special Requests   Final    BOTTLES DRAWN AEROBIC AND ANAEROBIC Blood Culture results may not be optimal due to an inadequate volume of blood received in culture bottles   Culture   Final    NO GROWTH 2 DAYS Performed at [facility], [address], [place], [place] [address]    Report Status PENDING  Incomplete  Blood culture (routine x 2)     Status: None (Preliminary result)   Collection Time: 06/11/24  3:40 PM   Specimen: BLOOD RIGHT ARM  Result Value Ref Range Status   Specimen Description BLOOD RIGHT ARM  Final   Special Requests   Final    BOTTLES DRAWN AEROBIC AND ANAEROBIC Blood Culture results may not be optimal due to an inadequate volume of blood received in culture bottles   Culture   Final    NO GROWTH 2 DAYS Performed at [facility], [address], [place], [place] [address]    Report Status PENDING  Incomplete    Labs: CBC: Recent Labs  Lab 06/11/24 1215 06/12/24 0514 06/13/24 0454 06/14/24  0537  WBC 3.4* 4.7 4.6 4.1  NEUTROABS 1.6*  --   --   --   HGB 16.7 12.4* 11.5* 11.8*  HCT 50.6 36.3* 32.7* 34.2*  MCV 93.2 92.4 91.6 91.9  PLT 53* 63* 52* 53*   Basic Metabolic Panel: Recent Labs  Lab 06/12/24 0514 06/12/24 1452 06/12/24 2330 06/13/24 0454 06/14/24 0537  NA 145 140 138 140 140  K 2.7* 3.3* 3.4* 3.6 4.2  CL 105 100 104 106 107  CO2 27 27 27 29 26   GLUCOSE 103* 195* 167* 101* 91  BUN 20 19 17 13 12   CREATININE 1.65* 1.63* 1.38* 1.36* 1.20  CALCIUM  7.7* 7.8* 7.4* 7.8* 7.8*  MG 1.1* 1.8  --  1.2* 1.2*  PHOS  --   --   --  1.3*  --    Liver Function Tests: Recent Labs  Lab 06/11/24 1215 06/12/24 0514  AST 131* 76*  ALT 80* 54*  ALKPHOS 363* 242*  BILITOT 4.2* 3.6*  PROT 6.8 4.8*  ALBUMIN 3.2* 2.2*   CBG: No results for input(s): GLUCAP in the last 168 hours.  Discharge time spent: greater than 30  minutes.  Signed: Delon Herald, MD [facility] 06/14/2024

## 2024-06-14 NOTE — Progress Notes (Addendum)
 [facility] [id number] [facility] Liaison Note  Received request from [facility], for hospice services at home after discharge. Spoke with patient's wife to initiate education related to hospice philosophy, services and team approach to care. Wife verbalized understanding of information given. Per discussion, the plan is for discharge home today.  DME needs discussed. Patient has the following equipment in the home: wheelchair Family requests the following equipment for delivery: BSC, walker.  Please send signed and completed DNR home with patient/family. Please provide prescriptions at discharge as needed to ensure ongoing symptom management.  [facility] information and contact numbers given to patient's wife. Please call with any concerns.  Thank you for the opportunity to participate in this patient's care.   Eleanor Nail, LPN [facility] Liaison [phone]

## 2024-06-14 NOTE — Plan of Care (Signed)
 Home with Hospice. Wife is doing an Excellent job caring for him.  Problem: Pain Managment: Goal: General experience of comfort will improve and/or be controlled 06/14/2024 1223 by Mariano Rosina LABOR, RN Outcome: Adequate for Discharge 06/14/2024 1222 by Mariano Rosina LABOR, RN Outcome: Progressing   Problem: Education: Goal: Knowledge of General Education information will improve Description: Including pain rating scale, medication(s)/side effects and non-pharmacologic comfort measures 06/14/2024 1223 by Mariano Rosina LABOR, RN Outcome: Adequate for Discharge 06/14/2024 1222 by Mariano Rosina LABOR, RN Outcome: Progressing   Problem: Health Behavior/Discharge Planning: Goal: Ability to manage health-related needs will improve 06/14/2024 1223 by Mariano Rosina LABOR, RN Outcome: Adequate for Discharge 06/14/2024 1222 by Mariano Rosina LABOR, RN Outcome: Progressing   Problem: Clinical Measurements: Goal: Ability to maintain clinical measurements within normal limits will improve 06/14/2024 1223 by Mariano Rosina LABOR, RN Outcome: Adequate for Discharge 06/14/2024 1222 by Mariano Rosina LABOR, RN Outcome: Progressing Goal: Diagnostic test results will improve 06/14/2024 1223 by Mariano Rosina LABOR, RN Outcome: Adequate for Discharge 06/14/2024 1222 by Mariano Rosina LABOR, RN Outcome: Progressing   Problem: Nutrition: Goal: Adequate nutrition will be maintained 06/14/2024 1223 by Mariano Rosina LABOR, RN Outcome: Adequate for Discharge 06/14/2024 1222 by Mariano Rosina LABOR, RN Outcome: Progressing   Problem: Coping: Goal: Level of anxiety will decrease 06/14/2024 1223 by Mariano Rosina LABOR, RN Outcome: Adequate for Discharge 06/14/2024 1222 by Mariano Rosina LABOR, RN Outcome: Progressing   Problem: Elimination: Goal: Will not experience complications related to bowel motility 06/14/2024 1223 by Mariano Rosina LABOR, RN Outcome: Adequate for  Discharge 06/14/2024 1222 by Mariano Rosina LABOR, RN Outcome: Progressing Goal: Will not experience complications related to urinary retention 06/14/2024 1223 by Mariano Rosina LABOR, RN Outcome: Adequate for Discharge 06/14/2024 1222 by Mariano Rosina LABOR, RN Outcome: Progressing   Problem: Safety: Goal: Ability to remain free from injury will improve 06/14/2024 1223 by Mariano Rosina LABOR, RN Outcome: Adequate for Discharge 06/14/2024 1222 by Mariano Rosina LABOR, RN Outcome: Progressing   Problem: Skin Integrity: Goal: Risk for impaired skin integrity will decrease 06/14/2024 1223 by Mariano Rosina LABOR, RN Outcome: Adequate for Discharge 06/14/2024 1222 by Mariano Rosina LABOR, RN Outcome: Progressing

## 2024-06-15 ENCOUNTER — Telehealth: Payer: Self-pay | Admitting: Internal Medicine

## 2024-06-15 ENCOUNTER — Other Ambulatory Visit (HOSPITAL_COMMUNITY): Payer: Self-pay

## 2024-06-15 NOTE — Telephone Encounter (Signed)
 Already spoke to wife and she has the printed rx and can take it to local pharmacy to have it filled  Copied from CRM [id number]. Topic: Clinical - Prescription Issue >> Jun 15, 2024 10:28 AM Graeme ORN wrote: Reason for CRM: Patient wife called states patient was just discharged from hospital with instructions for mouthwash 4 times per day. She wanted to bring papers by to have provider sign Rx for mouth wash. Let her know it has already been sent to [facility]. She said she went to local pharmacy to pick it up and would like to get it filled today if possible. Would like to know if its something provider can change or if she needs to contact hospital or center [facility]. Thank You

## 2024-06-15 NOTE — Patient Outreach (Signed)
 Complex Care Management   Visit Note  06/15/2024  Name:  Jeremy Reeves MRN: [id number] DOB: 10/31/1940  Situation: Referral received for Complex Care Management related to bathroom modifications I obtained verbal consent from Caregiver.  Visit completed with Caregiver  on the phone  Background:   Past Medical History:  Diagnosis Date   ASHD (arteriosclerotic heart disease)    BPH (benign prostatic hyperplasia)    Difficult intubation    anesthesia record 1999 / record on chart   Foley catheter in place 10/23/12   GERD (gastroesophageal reflux disease)    HH (hiatus hernia)    Hiatal hernia    HIV (human immunodeficiency virus infection) (HCC) dx'd 2014   Hyperlipidemia    Hypertension    Shingles 8/14   Type II diabetes mellitus (HCC)    pre diabetes    Assessment: Sw spoke with the patients wife Jeremy Reeves and she stated that the patients swallowing had improved  and that his walker and shower seat had srrived today and she thougth everything was good now and did not need the SW at this time    SDOH Interventions    Flowsheet Row ED to Hosp-Admission (Discharged) from 06/11/2024 in [facility] 5M KIDNEY UNIT Patient Outreach Telephone from 06/08/2024 in [place] POPULATION HEALTH DEPARTMENT Patient Outreach Telephone from 05/15/2024 in [place] POPULATION HEALTH DEPARTMENT Telephone from 05/11/2024 in [place] POPULATION HEALTH DEPARTMENT Clinical Support from 05/31/2023 in [facility] Care Coordination from 01/06/2023 in [facility] Community Care Coordination  SDOH Interventions        Food Insecurity Interventions -- -- Intervention Not Indicated -- Intervention Not Indicated Intervention Not Indicated  Housing Interventions -- -- Intervention Not Indicated -- Intervention Not Indicated Intervention Not Indicated  Transportation Interventions Inpatient TOC, Intervention Not Indicated, Patient Resources Dietitian) -- Intervention Not  Indicated -- Intervention Not Indicated Intervention Not Indicated  Utilities Interventions -- -- Intervention Not Indicated -- Intervention Not Indicated Intervention Not Indicated  Alcohol Usage Interventions -- -- -- -- Intervention Not Indicated (Score <7) --  Depression Interventions/Treatment  -- --  ear[place] to VBCI SW] -- -- PHQ2-9 Score <4 Follow-up Not Indicated --  Financial Strain Interventions -- -- Intervention Not Indicated -- Intervention Not Indicated --  Physical Activity Interventions -- -- -- -- Other (Comments)  [works out in the yard] --  Stress Interventions -- -- -- Programmer, applications Provided  Jeremy Reeves mailed caregiver resources as requested by wife] Intervention Not Indicated --  Social Connections Interventions -- -- -- -- Intervention Not Indicated --  Health Literacy Interventions -- -- -- -- Intervention Not Indicated --    Recommendation:   none  Follow Up Plan:   Closing From:  Complex Care Management  Jeremy Reeves Jeremy Reeves Jeremy HEDWIG, PhD [facility], [facility] Social Worker Direct Dial: [phone]  Fax: [phone]

## 2024-06-15 NOTE — Patient Instructions (Signed)
 Visit Information  Thank you for taking time to visit with me today. Please don't hesitate to contact me if I can be of assistance to you before our next scheduled appointment.  Our next appointment is no further scheduled appointments.  Please call the care guide team at [phone] if you need to cancel or reschedule your appointment.   Following is a copy of your care plan:   Goals Addressed   None     Please call the Suicide and Crisis Lifeline: 988 go to [facility] [address], [place] [phone]) call 911 if you are experiencing a Mental Health or Behavioral Health Crisis or need someone to talk to.  The patient verbalized understanding of instructions, educational materials, and care plan provided today and DECLINED offer to receive copy of patient instructions, educational materials, and care plan.   Tobias CHARM Maranda HEDWIG, PhD [facility], [facility] Social Worker Direct Dial: [phone]  Fax: [phone]

## 2024-06-16 LAB — CULTURE, BLOOD (ROUTINE X 2)
Culture: NO GROWTH
Culture: NO GROWTH

## 2024-06-22 DIAGNOSIS — D631 Anemia in chronic kidney disease: Secondary | ICD-10-CM | POA: Diagnosis not present

## 2024-06-22 DIAGNOSIS — K219 Gastro-esophageal reflux disease without esophagitis: Secondary | ICD-10-CM | POA: Diagnosis not present

## 2024-06-22 DIAGNOSIS — R634 Abnormal weight loss: Secondary | ICD-10-CM | POA: Diagnosis not present

## 2024-06-22 DIAGNOSIS — R1310 Dysphagia, unspecified: Secondary | ICD-10-CM | POA: Diagnosis not present

## 2024-06-22 DIAGNOSIS — N401 Enlarged prostate with lower urinary tract symptoms: Secondary | ICD-10-CM | POA: Diagnosis not present

## 2024-06-22 DIAGNOSIS — I10 Essential (primary) hypertension: Secondary | ICD-10-CM | POA: Diagnosis not present

## 2024-06-22 DIAGNOSIS — I4891 Unspecified atrial fibrillation: Secondary | ICD-10-CM | POA: Diagnosis not present

## 2024-06-22 DIAGNOSIS — N1832 Chronic kidney disease, stage 3b: Secondary | ICD-10-CM | POA: Diagnosis not present

## 2024-06-22 DIAGNOSIS — I2510 Atherosclerotic heart disease of native coronary artery without angina pectoris: Secondary | ICD-10-CM | POA: Diagnosis not present

## 2024-06-22 DIAGNOSIS — E785 Hyperlipidemia, unspecified: Secondary | ICD-10-CM | POA: Diagnosis not present

## 2024-06-22 DIAGNOSIS — E119 Type 2 diabetes mellitus without complications: Secondary | ICD-10-CM | POA: Diagnosis not present

## 2024-06-22 DIAGNOSIS — I679 Cerebrovascular disease, unspecified: Secondary | ICD-10-CM | POA: Diagnosis not present

## 2024-06-22 LAB — SPECIMEN STATUS REPORT

## 2024-06-22 LAB — HGB A1C W/O EAG: Hgb A1c MFr Bld: 6.3 % — ABNORMAL HIGH (ref 4.8–5.6)

## 2024-06-23 DIAGNOSIS — K219 Gastro-esophageal reflux disease without esophagitis: Secondary | ICD-10-CM | POA: Diagnosis not present

## 2024-06-23 DIAGNOSIS — I10 Essential (primary) hypertension: Secondary | ICD-10-CM | POA: Diagnosis not present

## 2024-06-23 DIAGNOSIS — N401 Enlarged prostate with lower urinary tract symptoms: Secondary | ICD-10-CM | POA: Diagnosis not present

## 2024-06-23 DIAGNOSIS — R634 Abnormal weight loss: Secondary | ICD-10-CM | POA: Diagnosis not present

## 2024-06-23 DIAGNOSIS — R1310 Dysphagia, unspecified: Secondary | ICD-10-CM | POA: Diagnosis not present

## 2024-06-23 DIAGNOSIS — I679 Cerebrovascular disease, unspecified: Secondary | ICD-10-CM | POA: Diagnosis not present

## 2024-06-25 ENCOUNTER — Ambulatory Visit: Payer: MEDICARE | Admitting: Physician Assistant

## 2024-06-25 ENCOUNTER — Ambulatory Visit: Payer: MEDICARE | Admitting: Speech Pathology

## 2024-06-25 ENCOUNTER — Ambulatory Visit: Payer: MEDICARE

## 2024-06-25 DIAGNOSIS — I10 Essential (primary) hypertension: Secondary | ICD-10-CM | POA: Diagnosis not present

## 2024-06-25 DIAGNOSIS — N401 Enlarged prostate with lower urinary tract symptoms: Secondary | ICD-10-CM | POA: Diagnosis not present

## 2024-06-25 DIAGNOSIS — I679 Cerebrovascular disease, unspecified: Secondary | ICD-10-CM | POA: Diagnosis not present

## 2024-06-25 DIAGNOSIS — R634 Abnormal weight loss: Secondary | ICD-10-CM | POA: Diagnosis not present

## 2024-06-25 DIAGNOSIS — R1310 Dysphagia, unspecified: Secondary | ICD-10-CM | POA: Diagnosis not present

## 2024-06-25 DIAGNOSIS — K219 Gastro-esophageal reflux disease without esophagitis: Secondary | ICD-10-CM | POA: Diagnosis not present

## 2024-06-28 ENCOUNTER — Other Ambulatory Visit: Payer: Self-pay

## 2024-07-02 DIAGNOSIS — I679 Cerebrovascular disease, unspecified: Secondary | ICD-10-CM | POA: Diagnosis not present

## 2024-07-02 DIAGNOSIS — R1310 Dysphagia, unspecified: Secondary | ICD-10-CM | POA: Diagnosis not present

## 2024-07-02 DIAGNOSIS — R634 Abnormal weight loss: Secondary | ICD-10-CM | POA: Diagnosis not present

## 2024-07-02 DIAGNOSIS — N401 Enlarged prostate with lower urinary tract symptoms: Secondary | ICD-10-CM | POA: Diagnosis not present

## 2024-07-02 DIAGNOSIS — K219 Gastro-esophageal reflux disease without esophagitis: Secondary | ICD-10-CM | POA: Diagnosis not present

## 2024-07-02 DIAGNOSIS — I10 Essential (primary) hypertension: Secondary | ICD-10-CM | POA: Diagnosis not present

## 2024-07-04 ENCOUNTER — Other Ambulatory Visit: Payer: Self-pay | Admitting: Pharmacy Technician

## 2024-07-04 ENCOUNTER — Other Ambulatory Visit: Payer: Self-pay

## 2024-07-04 NOTE — Progress Notes (Signed)
 Specialty Pharmacy Refill Coordination Note  Jeremy Reeves is a 83 y.o. male contacted today regarding refills of specialty medication(s) Dolutegravir -lamiVUDine  (Dovato )  Spoke with Wife  Patient requested Delivery   Delivery date: 07/11/24   Verified address: [address]  [place]   Medication will be filled on 07/10/24.

## 2024-07-09 ENCOUNTER — Telehealth: Payer: MEDICARE

## 2024-07-09 DIAGNOSIS — R634 Abnormal weight loss: Secondary | ICD-10-CM | POA: Diagnosis not present

## 2024-07-09 DIAGNOSIS — N401 Enlarged prostate with lower urinary tract symptoms: Secondary | ICD-10-CM | POA: Diagnosis not present

## 2024-07-09 DIAGNOSIS — I10 Essential (primary) hypertension: Secondary | ICD-10-CM | POA: Diagnosis not present

## 2024-07-09 DIAGNOSIS — R1310 Dysphagia, unspecified: Secondary | ICD-10-CM | POA: Diagnosis not present

## 2024-07-09 DIAGNOSIS — K219 Gastro-esophageal reflux disease without esophagitis: Secondary | ICD-10-CM | POA: Diagnosis not present

## 2024-07-09 DIAGNOSIS — I679 Cerebrovascular disease, unspecified: Secondary | ICD-10-CM | POA: Diagnosis not present

## 2024-07-09 NOTE — Patient Outreach (Signed)
 Unable to reach patient's wife today. Per chart review, patient was admitted on 06/11/24 for Hypotension and discharged home under the care of Hospice. Closed patient from complex case management.   Clayborne Ly RN BSN CCM [place]  [facility], [facility] Nurse Care Coordinator  Direct Dial: [phone] Website: [email or web address]

## 2024-07-10 ENCOUNTER — Ambulatory Visit: Payer: MEDICARE | Admitting: Podiatry

## 2024-07-10 ENCOUNTER — Ambulatory Visit (INDEPENDENT_AMBULATORY_CARE_PROVIDER_SITE_OTHER): Payer: MEDICARE

## 2024-07-10 VITALS — Ht 66.0 in | Wt 117.0 lb

## 2024-07-10 DIAGNOSIS — M79675 Pain in left toe(s): Secondary | ICD-10-CM

## 2024-07-10 DIAGNOSIS — B351 Tinea unguium: Secondary | ICD-10-CM | POA: Diagnosis not present

## 2024-07-10 DIAGNOSIS — E11628 Type 2 diabetes mellitus with other skin complications: Secondary | ICD-10-CM | POA: Diagnosis not present

## 2024-07-10 DIAGNOSIS — M79674 Pain in right toe(s): Secondary | ICD-10-CM | POA: Diagnosis not present

## 2024-07-10 DIAGNOSIS — M2012 Hallux valgus (acquired), left foot: Secondary | ICD-10-CM

## 2024-07-10 DIAGNOSIS — B353 Tinea pedis: Secondary | ICD-10-CM

## 2024-07-10 DIAGNOSIS — M2042 Other hammer toe(s) (acquired), left foot: Secondary | ICD-10-CM

## 2024-07-10 DIAGNOSIS — L84 Corns and callosities: Secondary | ICD-10-CM

## 2024-07-10 NOTE — Progress Notes (Signed)
 Chief Complaint  Patient presents with   Diabetes    Patient is here for routine [facility] for Pain due to onychomycosis of toenails of both feet. Patient is also having swelling and some bunion pain on right foot. Diabetic shoes   HPI: 83 y.o. male presents today with several concerns.  He is requesting diabetic nail care.  States the toenails are painful in shoe gear when ambulating.  Past Medical History:  Diagnosis Date   ASHD (arteriosclerotic heart disease)    BPH (benign prostatic hyperplasia)    Difficult intubation    anesthesia record 1999 / record on chart   Foley catheter in place 10/23/2012   GERD (gastroesophageal reflux disease)    HH (hiatus hernia)    Hiatal hernia    HIV (human immunodeficiency virus infection) (HCC) dx'd 2014   Hyperlipidemia    Hypertension    Shingles 05/18/2013   Type II diabetes mellitus (HCC)    pre diabetes    Past Surgical History:  Procedure Laterality Date   CARDIAC CATHETERIZATION  05/02/1998   Recommend CABG   CARDIOVASCULAR STRESS TEST  12/01/2011   Perfusion defect consistent with diaphragmatic attenuation. There is no scintigraphic of inducible myocardial ischemia.    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 LIMA to the LAD, vein to obtuse marginal, and vein to RCA   IRRIGATION AND DEBRIDEMENT ABSCESS Right 12/04/2015   Procedure: MINOR INCISION AND DRAINAGE OF ABSCESS;  Surgeon: Franky Curia, MD;  Location: [facility];  Service: Orthopedics;  Laterality: Right;   TRANSTHORACIC ECHOCARDIOGRAM  03/29/2011   EF ~50%, mild-moderate inferior wall hypokinesis   TRANSURETHRAL RESECTION OF PROSTATE N/A 10/25/2013   Procedure: TRANSURETHRAL RESECTION OF THE PROSTATE WITH GYRUS BUTTON;  Surgeon: Norleen Seltzer, MD;  Location: [facility];  Service: Urology;  Laterality: N/A;    Allergies  Allergen Reactions   Peanut-Containing Drug Products Swelling    Physical Exam: General: The patient is alert and  oriented x3 in no acute distress.  Dermatology: Skin is warm, dry and supple bilateral lower extremities.  The left first interspace is macerated without ulceration.  The toenails x10 are 3 mm thick with yellow discoloration, subungual debris, distal onycholysis, and pain with compression.  They are all elongated.  Mild pain to palpation of medial right 1st MTP. No pain with ROM.   Vascular: Palpable pedal pulses bilaterally. Capillary refill within normal limits.  No appreciable edema.  No erythema or calor.  Assessment/Plan of Care: 1. Pain due to onychomycosis of toenails of both feet   2. Hallux valgus, left   3. Hammertoe of left foot   4. Tinea pedis of left foot   5. Callus of foot   6. Type 2 diabetes mellitus with other skin complication, without long-term current use of insulin  (HCC)      The mycotic toenails were sharply debrided with sterile nail nippers and a power debriding bur x 10 to decrease girth and length.  Continue with ketoconazole  2% cream - apply once daily to the left first interspace.  He did have some complaints of pain to palpation of the medial 1st MTP. I recommended a bunion sleeve to help pad this area.    He will follow-up in 3 months  Prentice Ovens, DPM  [facility]     [address]  [place], [place] [address]                Office [phone]  Fax [phone]

## 2024-07-13 DIAGNOSIS — I10 Essential (primary) hypertension: Secondary | ICD-10-CM | POA: Diagnosis not present

## 2024-07-13 DIAGNOSIS — R1310 Dysphagia, unspecified: Secondary | ICD-10-CM | POA: Diagnosis not present

## 2024-07-13 DIAGNOSIS — I679 Cerebrovascular disease, unspecified: Secondary | ICD-10-CM | POA: Diagnosis not present

## 2024-07-13 DIAGNOSIS — K219 Gastro-esophageal reflux disease without esophagitis: Secondary | ICD-10-CM | POA: Diagnosis not present

## 2024-07-13 DIAGNOSIS — R634 Abnormal weight loss: Secondary | ICD-10-CM | POA: Diagnosis not present

## 2024-07-13 DIAGNOSIS — N401 Enlarged prostate with lower urinary tract symptoms: Secondary | ICD-10-CM | POA: Diagnosis not present

## 2024-07-16 DIAGNOSIS — I10 Essential (primary) hypertension: Secondary | ICD-10-CM | POA: Diagnosis not present

## 2024-07-16 DIAGNOSIS — R1310 Dysphagia, unspecified: Secondary | ICD-10-CM | POA: Diagnosis not present

## 2024-07-16 DIAGNOSIS — K219 Gastro-esophageal reflux disease without esophagitis: Secondary | ICD-10-CM | POA: Diagnosis not present

## 2024-07-16 DIAGNOSIS — N401 Enlarged prostate with lower urinary tract symptoms: Secondary | ICD-10-CM | POA: Diagnosis not present

## 2024-07-16 DIAGNOSIS — R634 Abnormal weight loss: Secondary | ICD-10-CM | POA: Diagnosis not present

## 2024-07-16 DIAGNOSIS — I679 Cerebrovascular disease, unspecified: Secondary | ICD-10-CM | POA: Diagnosis not present

## 2024-07-18 DIAGNOSIS — R634 Abnormal weight loss: Secondary | ICD-10-CM | POA: Diagnosis not present

## 2024-07-18 DIAGNOSIS — N401 Enlarged prostate with lower urinary tract symptoms: Secondary | ICD-10-CM | POA: Diagnosis not present

## 2024-07-18 DIAGNOSIS — D631 Anemia in chronic kidney disease: Secondary | ICD-10-CM | POA: Diagnosis not present

## 2024-07-18 DIAGNOSIS — I2510 Atherosclerotic heart disease of native coronary artery without angina pectoris: Secondary | ICD-10-CM | POA: Diagnosis not present

## 2024-07-18 DIAGNOSIS — N1832 Chronic kidney disease, stage 3b: Secondary | ICD-10-CM | POA: Diagnosis not present

## 2024-07-18 DIAGNOSIS — E119 Type 2 diabetes mellitus without complications: Secondary | ICD-10-CM | POA: Diagnosis not present

## 2024-07-18 DIAGNOSIS — I679 Cerebrovascular disease, unspecified: Secondary | ICD-10-CM | POA: Diagnosis not present

## 2024-07-18 DIAGNOSIS — I10 Essential (primary) hypertension: Secondary | ICD-10-CM | POA: Diagnosis not present

## 2024-07-18 DIAGNOSIS — K219 Gastro-esophageal reflux disease without esophagitis: Secondary | ICD-10-CM | POA: Diagnosis not present

## 2024-07-18 DIAGNOSIS — R1310 Dysphagia, unspecified: Secondary | ICD-10-CM | POA: Diagnosis not present

## 2024-07-18 DIAGNOSIS — E785 Hyperlipidemia, unspecified: Secondary | ICD-10-CM | POA: Diagnosis not present

## 2024-07-18 DIAGNOSIS — I4891 Unspecified atrial fibrillation: Secondary | ICD-10-CM | POA: Diagnosis not present

## 2024-07-23 DIAGNOSIS — R1310 Dysphagia, unspecified: Secondary | ICD-10-CM | POA: Diagnosis not present

## 2024-07-23 DIAGNOSIS — N401 Enlarged prostate with lower urinary tract symptoms: Secondary | ICD-10-CM | POA: Diagnosis not present

## 2024-07-23 DIAGNOSIS — K219 Gastro-esophageal reflux disease without esophagitis: Secondary | ICD-10-CM | POA: Diagnosis not present

## 2024-07-23 DIAGNOSIS — I10 Essential (primary) hypertension: Secondary | ICD-10-CM | POA: Diagnosis not present

## 2024-07-23 DIAGNOSIS — I679 Cerebrovascular disease, unspecified: Secondary | ICD-10-CM | POA: Diagnosis not present

## 2024-07-23 DIAGNOSIS — R634 Abnormal weight loss: Secondary | ICD-10-CM | POA: Diagnosis not present

## 2024-07-26 ENCOUNTER — Ambulatory Visit: Payer: MEDICARE | Admitting: Physician Assistant

## 2024-07-30 DIAGNOSIS — I679 Cerebrovascular disease, unspecified: Secondary | ICD-10-CM | POA: Diagnosis not present

## 2024-07-30 DIAGNOSIS — R634 Abnormal weight loss: Secondary | ICD-10-CM | POA: Diagnosis not present

## 2024-07-30 DIAGNOSIS — N401 Enlarged prostate with lower urinary tract symptoms: Secondary | ICD-10-CM | POA: Diagnosis not present

## 2024-07-30 DIAGNOSIS — I10 Essential (primary) hypertension: Secondary | ICD-10-CM | POA: Diagnosis not present

## 2024-07-30 DIAGNOSIS — R1310 Dysphagia, unspecified: Secondary | ICD-10-CM | POA: Diagnosis not present

## 2024-07-30 DIAGNOSIS — K219 Gastro-esophageal reflux disease without esophagitis: Secondary | ICD-10-CM | POA: Diagnosis not present

## 2024-07-31 ENCOUNTER — Other Ambulatory Visit: Payer: Self-pay

## 2024-07-31 ENCOUNTER — Other Ambulatory Visit: Payer: Self-pay | Admitting: Pharmacy Technician

## 2024-07-31 NOTE — Progress Notes (Signed)
 Specialty Pharmacy Refill Coordination Note  Jeremy Reeves is a 83 y.o. male contacted today regarding refills of specialty medication(s) Dolutegravir -lamiVUDine  (Dovato )  Spoke with Wife  Patient requested Delivery   Delivery date: 08/09/24   Verified address: [address]  [place]   Medication will be filled on 08/08/24.

## 2024-07-31 NOTE — Progress Notes (Signed)
 Specialty Pharmacy Ongoing Clinical Assessment Note  Jeremy Reeves is a 83 y.o. male who is being followed by the specialty pharmacy service for RxSp HIV   Patient's specialty medication(s) reviewed today: Dolutegravir -lamiVUDine  (Dovato )   Missed doses in the last 4 weeks: 0   Patient/Caregiver did not have any additional questions or concerns.   Therapeutic benefit summary: Patient is achieving benefit   Adverse events/side effects summary: No adverse events/side effects   Patient's therapy is appropriate to: Continue    Goals Addressed             This Visit's Progress    Achieve Undetectable HIV Viral Load < 20   On track    Patient is on track. Patient will maintain adherence.  Patient's viral load remained undetectable, as of 03/07/24.      Comply with lab assessments   On track    Patient is on track. Patient will adhere to provider and/or lab appointments      Increase CD4 count until steady state   On track    Patient is on track. Patient will maintain adherence         Follow up: 12 months  [facility]

## 2024-08-01 DIAGNOSIS — N401 Enlarged prostate with lower urinary tract symptoms: Secondary | ICD-10-CM | POA: Diagnosis not present

## 2024-08-01 DIAGNOSIS — I10 Essential (primary) hypertension: Secondary | ICD-10-CM | POA: Diagnosis not present

## 2024-08-01 DIAGNOSIS — K219 Gastro-esophageal reflux disease without esophagitis: Secondary | ICD-10-CM | POA: Diagnosis not present

## 2024-08-01 DIAGNOSIS — I679 Cerebrovascular disease, unspecified: Secondary | ICD-10-CM | POA: Diagnosis not present

## 2024-08-01 DIAGNOSIS — R1310 Dysphagia, unspecified: Secondary | ICD-10-CM | POA: Diagnosis not present

## 2024-08-01 DIAGNOSIS — R634 Abnormal weight loss: Secondary | ICD-10-CM | POA: Diagnosis not present

## 2024-08-06 DIAGNOSIS — R1310 Dysphagia, unspecified: Secondary | ICD-10-CM | POA: Diagnosis not present

## 2024-08-06 DIAGNOSIS — R634 Abnormal weight loss: Secondary | ICD-10-CM | POA: Diagnosis not present

## 2024-08-06 DIAGNOSIS — K219 Gastro-esophageal reflux disease without esophagitis: Secondary | ICD-10-CM | POA: Diagnosis not present

## 2024-08-06 DIAGNOSIS — I10 Essential (primary) hypertension: Secondary | ICD-10-CM | POA: Diagnosis not present

## 2024-08-06 DIAGNOSIS — N401 Enlarged prostate with lower urinary tract symptoms: Secondary | ICD-10-CM | POA: Diagnosis not present

## 2024-08-06 DIAGNOSIS — I679 Cerebrovascular disease, unspecified: Secondary | ICD-10-CM | POA: Diagnosis not present

## 2024-08-08 ENCOUNTER — Other Ambulatory Visit: Payer: Self-pay

## 2024-08-13 DIAGNOSIS — R634 Abnormal weight loss: Secondary | ICD-10-CM | POA: Diagnosis not present

## 2024-08-13 DIAGNOSIS — K219 Gastro-esophageal reflux disease without esophagitis: Secondary | ICD-10-CM | POA: Diagnosis not present

## 2024-08-13 DIAGNOSIS — N401 Enlarged prostate with lower urinary tract symptoms: Secondary | ICD-10-CM | POA: Diagnosis not present

## 2024-08-13 DIAGNOSIS — I10 Essential (primary) hypertension: Secondary | ICD-10-CM | POA: Diagnosis not present

## 2024-08-13 DIAGNOSIS — R1310 Dysphagia, unspecified: Secondary | ICD-10-CM | POA: Diagnosis not present

## 2024-08-13 DIAGNOSIS — I679 Cerebrovascular disease, unspecified: Secondary | ICD-10-CM | POA: Diagnosis not present

## 2024-08-18 DIAGNOSIS — K219 Gastro-esophageal reflux disease without esophagitis: Secondary | ICD-10-CM | POA: Diagnosis not present

## 2024-08-18 DIAGNOSIS — E785 Hyperlipidemia, unspecified: Secondary | ICD-10-CM | POA: Diagnosis not present

## 2024-08-18 DIAGNOSIS — I679 Cerebrovascular disease, unspecified: Secondary | ICD-10-CM | POA: Diagnosis not present

## 2024-08-18 DIAGNOSIS — D631 Anemia in chronic kidney disease: Secondary | ICD-10-CM | POA: Diagnosis not present

## 2024-08-18 DIAGNOSIS — R634 Abnormal weight loss: Secondary | ICD-10-CM | POA: Diagnosis not present

## 2024-08-18 DIAGNOSIS — R1310 Dysphagia, unspecified: Secondary | ICD-10-CM | POA: Diagnosis not present

## 2024-08-18 DIAGNOSIS — I10 Essential (primary) hypertension: Secondary | ICD-10-CM | POA: Diagnosis not present

## 2024-08-18 DIAGNOSIS — I4891 Unspecified atrial fibrillation: Secondary | ICD-10-CM | POA: Diagnosis not present

## 2024-08-18 DIAGNOSIS — N1832 Chronic kidney disease, stage 3b: Secondary | ICD-10-CM | POA: Diagnosis not present

## 2024-08-18 DIAGNOSIS — E119 Type 2 diabetes mellitus without complications: Secondary | ICD-10-CM | POA: Diagnosis not present

## 2024-08-18 DIAGNOSIS — N401 Enlarged prostate with lower urinary tract symptoms: Secondary | ICD-10-CM | POA: Diagnosis not present

## 2024-08-18 DIAGNOSIS — I2510 Atherosclerotic heart disease of native coronary artery without angina pectoris: Secondary | ICD-10-CM | POA: Diagnosis not present

## 2024-08-20 DIAGNOSIS — I679 Cerebrovascular disease, unspecified: Secondary | ICD-10-CM | POA: Diagnosis not present

## 2024-08-20 DIAGNOSIS — I10 Essential (primary) hypertension: Secondary | ICD-10-CM | POA: Diagnosis not present

## 2024-08-20 DIAGNOSIS — R1310 Dysphagia, unspecified: Secondary | ICD-10-CM | POA: Diagnosis not present

## 2024-08-20 DIAGNOSIS — R634 Abnormal weight loss: Secondary | ICD-10-CM | POA: Diagnosis not present

## 2024-08-20 DIAGNOSIS — N401 Enlarged prostate with lower urinary tract symptoms: Secondary | ICD-10-CM | POA: Diagnosis not present

## 2024-08-20 DIAGNOSIS — K219 Gastro-esophageal reflux disease without esophagitis: Secondary | ICD-10-CM | POA: Diagnosis not present

## 2024-08-21 DIAGNOSIS — R634 Abnormal weight loss: Secondary | ICD-10-CM | POA: Diagnosis not present

## 2024-08-21 DIAGNOSIS — K219 Gastro-esophageal reflux disease without esophagitis: Secondary | ICD-10-CM | POA: Diagnosis not present

## 2024-08-21 DIAGNOSIS — I679 Cerebrovascular disease, unspecified: Secondary | ICD-10-CM | POA: Diagnosis not present

## 2024-08-21 DIAGNOSIS — R1310 Dysphagia, unspecified: Secondary | ICD-10-CM | POA: Diagnosis not present

## 2024-08-21 DIAGNOSIS — N401 Enlarged prostate with lower urinary tract symptoms: Secondary | ICD-10-CM | POA: Diagnosis not present

## 2024-08-21 DIAGNOSIS — I10 Essential (primary) hypertension: Secondary | ICD-10-CM | POA: Diagnosis not present

## 2024-08-27 DIAGNOSIS — K219 Gastro-esophageal reflux disease without esophagitis: Secondary | ICD-10-CM | POA: Diagnosis not present

## 2024-08-27 DIAGNOSIS — R634 Abnormal weight loss: Secondary | ICD-10-CM | POA: Diagnosis not present

## 2024-08-27 DIAGNOSIS — R1310 Dysphagia, unspecified: Secondary | ICD-10-CM | POA: Diagnosis not present

## 2024-08-27 DIAGNOSIS — I10 Essential (primary) hypertension: Secondary | ICD-10-CM | POA: Diagnosis not present

## 2024-08-27 DIAGNOSIS — N401 Enlarged prostate with lower urinary tract symptoms: Secondary | ICD-10-CM | POA: Diagnosis not present

## 2024-08-27 DIAGNOSIS — I679 Cerebrovascular disease, unspecified: Secondary | ICD-10-CM | POA: Diagnosis not present

## 2024-08-28 ENCOUNTER — Ambulatory Visit: Payer: MEDICARE | Admitting: Cardiovascular Disease

## 2024-09-04 ENCOUNTER — Other Ambulatory Visit: Payer: Self-pay

## 2024-09-04 ENCOUNTER — Ambulatory Visit: Payer: MEDICARE | Admitting: Cardiovascular Disease

## 2024-09-04 DIAGNOSIS — I679 Cerebrovascular disease, unspecified: Secondary | ICD-10-CM | POA: Diagnosis not present

## 2024-09-04 DIAGNOSIS — I10 Essential (primary) hypertension: Secondary | ICD-10-CM | POA: Diagnosis not present

## 2024-09-04 DIAGNOSIS — R634 Abnormal weight loss: Secondary | ICD-10-CM | POA: Diagnosis not present

## 2024-09-04 DIAGNOSIS — N401 Enlarged prostate with lower urinary tract symptoms: Secondary | ICD-10-CM | POA: Diagnosis not present

## 2024-09-04 DIAGNOSIS — R1310 Dysphagia, unspecified: Secondary | ICD-10-CM | POA: Diagnosis not present

## 2024-09-04 DIAGNOSIS — K219 Gastro-esophageal reflux disease without esophagitis: Secondary | ICD-10-CM | POA: Diagnosis not present

## 2024-09-04 NOTE — Progress Notes (Signed)
 Specialty Pharmacy Refill Coordination Note  Jeremy Reeves is a 83 y.o. male contacted today regarding refills of specialty medication(s) Dolutegravir -lamiVUDine  (Dovato )   Patient requested Delivery   Delivery date: 09/07/24   Verified address: [address]  [place]   Medication will be filled on: 09/06/24

## 2024-09-05 ENCOUNTER — Other Ambulatory Visit: Payer: Self-pay

## 2024-09-18 ENCOUNTER — Encounter: Payer: Self-pay | Admitting: Podiatry

## 2024-09-18 ENCOUNTER — Ambulatory Visit: Payer: MEDICARE | Admitting: Podiatry

## 2024-09-18 DIAGNOSIS — E11621 Type 2 diabetes mellitus with foot ulcer: Secondary | ICD-10-CM | POA: Diagnosis not present

## 2024-09-18 DIAGNOSIS — M79675 Pain in left toe(s): Secondary | ICD-10-CM

## 2024-09-18 DIAGNOSIS — M79674 Pain in right toe(s): Secondary | ICD-10-CM

## 2024-09-18 DIAGNOSIS — B351 Tinea unguium: Secondary | ICD-10-CM

## 2024-09-18 MED ORDER — SANTYL 250 UNIT/GM EX OINT: 1.0000 | TOPICAL_OINTMENT | Freq: Every day | CUTANEOUS | 3 refills | Status: AC

## 2024-09-18 NOTE — Progress Notes (Unsigned)
 Subjective:  Patient ID: Jeremy Reeves, male    DOB: 1941/06/28,  MRN: [id number]  Jeremy Reeves presents to clinic today for:  Chief Complaint  Patient presents with   Diabetes    [facility] Diet control diabetes. A1C 6.3. Toenail trim. Right lateral foot wound x 3 months. Applying medi honey and band aid. 10 pain with pressure.   Patient notes nails are thick, discolored, elongated and painful in shoegear when trying to ambulate.  His family member pointed out a wound on the lateral aspect of the right midfoot.  An outside specialist has been caring for this.  She states that there has been no improvement and she was wondering if there is anything else that can be done.  PCP is Jeremy Jeremy BROCKS, MD.  Past Medical History:  Diagnosis Date   ASHD (arteriosclerotic heart disease)    BPH (benign prostatic hyperplasia)    Difficult intubation    anesthesia record 1999 / record on chart   Foley catheter in place 10/23/2012   GERD (gastroesophageal reflux disease)    HH (hiatus hernia)    Hiatal hernia    HIV (human immunodeficiency virus infection) (HCC) dx'd 2014   Hyperlipidemia    Hypertension    Shingles 05/18/2013   Type II diabetes mellitus (HCC)    pre diabetes   Past Surgical History:  Procedure Laterality Date   CARDIAC CATHETERIZATION  05/02/1998   Recommend CABG   CARDIOVASCULAR STRESS TEST  12/01/2011   Perfusion defect consistent with diaphragmatic attenuation. There is no scintigraphic of inducible myocardial ischemia.    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 LIMA to the LAD, vein to obtuse marginal, and vein to RCA   IRRIGATION AND DEBRIDEMENT ABSCESS Right 12/04/2015   Procedure: MINOR INCISION AND DRAINAGE OF ABSCESS;  Surgeon: Jeremy Curia, MD;  Location: [facility];  Service: Orthopedics;  Laterality: Right;   TRANSTHORACIC ECHOCARDIOGRAM  03/29/2011   EF ~50%, mild-moderate inferior wall hypokinesis   TRANSURETHRAL  RESECTION OF PROSTATE N/A 10/25/2013   Procedure: TRANSURETHRAL RESECTION OF THE PROSTATE WITH GYRUS BUTTON;  Surgeon: Jeremy Seltzer, MD;  Location: [facility];  Service: Urology;  Laterality: N/A;   Allergies  Allergen Reactions   Peanut-Containing Drug Products Swelling    Review of Systems: Negative except as noted in the HPI.  Objective:  Jeremy Reeves is a pleasant 83 y.o. male in NAD. AAO x 3.  Vascular Examination: Capillary refill time is 3-5 seconds to toes bilateral. Palpable pedal pulses b/l LE. Digital hair present b/l.  Skin temperature gradient WNL b/l. No varicosities b/l. No cyanosis noted b/l.   Dermatological Examination: Pedal skin with normal turgor, texture and tone b/l. No open wounds. No interdigital macerations b/l. Toenails x10 are 3mm thick, discolored, dystrophic with subungual debris. There is pain with compression of the nail plates.  They are elongated x10  There is a full-thickness ulceration on the lateral aspect of the right fifth metatarsal base.  There is a hyperkeratotic rim.  There is a fibrous plug within the base.  No active drainage is noted.  No necrosis is seen.  There is some pain on palpation of the area.  No malodor and no surrounding erythema is noted.       Latest Ref Rng & Units 05/30/2024    3:33 PM  Hemoglobin A1C  Hemoglobin-A1c 4.8 - 5.6 % 6.3    Assessment/Plan: 1. Pain due to  onychomycosis of toenails of both feet   2. Type 2 diabetes mellitus with foot ulcer (CODE) (HCC)     Meds ordered this encounter  Medications   collagenase (SANTYL) 250 UNIT/GM ointment    Sig: Apply 1 Application topically daily. Cover with gauze dressing    Dispense:  15 g    Refill:  3    Right lateral foot wound measures 0.5 x 0.4 x 0.3 cm   The mycotic toenails were sharply debrided x10 with sterile nail nippers and a power debriding burr to decrease bulk/thickness and length.    Will send in a prescription to a specialty pharmacy for the Santyl  ointment.  This typically requires a prior authorization.  Explained how this works with the patient.  If the insurance still denies it after the prior Shara is completed, they can continue with the Medihoney.  However if it is covered this would work better than the Medihoney and they will apply once daily with a gauze dressing to the area.  Return in about 11 weeks (around 12/04/2024) for [facility].   Jeremy Reeves, DPM, FACFAS [facility]     [address] [place], [place] [address]                Office [phone]  Fax [phone]

## 2024-09-20 ENCOUNTER — Telehealth: Payer: Self-pay

## 2024-09-20 ENCOUNTER — Telehealth: Payer: Self-pay | Admitting: Podiatry

## 2024-09-20 NOTE — Telephone Encounter (Signed)
 PA request received from [facility] for Santyl 250 unit/gram ointment. PA submitted and waiting on response.  Norleen Margo (Key: [id number]) PA Case ID #: [id number] Rx #: [id number]

## 2024-09-20 NOTE — Telephone Encounter (Signed)
 Pharmacy called needing a PA for: Santyl ointment. Cover My Meds Key: [id number]

## 2024-09-20 NOTE — Telephone Encounter (Signed)
 PA was approved 10/19/2023-10/17/2025

## 2024-09-21 NOTE — Telephone Encounter (Signed)
 PA was submitted and approved 09/20/24

## 2024-09-28 ENCOUNTER — Other Ambulatory Visit (HOSPITAL_COMMUNITY): Payer: Self-pay

## 2024-10-01 ENCOUNTER — Other Ambulatory Visit: Payer: Self-pay

## 2024-10-01 NOTE — Progress Notes (Signed)
 Specialty Pharmacy Refill Coordination Note  Jeremy Reeves is a 83 y.o. male contacted today regarding refills of specialty medication(s) Dolutegravir -lamiVUDine  (Dovato )   Patient requested Delivery   Delivery date: 10/03/24   Verified address: [address]  [place]   Medication will be filled on: 10/02/24   Spoke with patient's wife

## 2024-10-02 ENCOUNTER — Other Ambulatory Visit: Payer: Self-pay

## 2024-10-09 ENCOUNTER — Ambulatory Visit: Payer: MEDICARE | Admitting: Podiatry

## 2024-10-25 ENCOUNTER — Other Ambulatory Visit (HOSPITAL_COMMUNITY): Payer: Self-pay

## 2024-10-25 ENCOUNTER — Other Ambulatory Visit: Payer: Self-pay | Admitting: Internal Medicine

## 2024-10-26 ENCOUNTER — Other Ambulatory Visit: Payer: Self-pay

## 2024-10-26 ENCOUNTER — Other Ambulatory Visit (HOSPITAL_COMMUNITY): Payer: Self-pay

## 2024-10-26 MED ORDER — DOVATO 50-300 MG PO TABS
1.0000 | ORAL_TABLET | Freq: Every day | ORAL | 1 refills | Status: AC
  Filled 2024-10-26 – 2024-11-01 (×3): qty 30, 30d supply, fill #0

## 2024-10-30 ENCOUNTER — Other Ambulatory Visit (HOSPITAL_COMMUNITY): Payer: Self-pay

## 2024-10-31 ENCOUNTER — Other Ambulatory Visit (HOSPITAL_COMMUNITY): Payer: Self-pay

## 2024-11-01 ENCOUNTER — Other Ambulatory Visit: Payer: Self-pay

## 2024-11-01 ENCOUNTER — Other Ambulatory Visit (HOSPITAL_COMMUNITY): Payer: Self-pay

## 2024-11-01 ENCOUNTER — Telehealth: Payer: Self-pay

## 2024-11-01 NOTE — Progress Notes (Signed)
 Specialty Pharmacy Refill Coordination Note  I spoke with the patient's wife. DARIC KOREN is a 84 y.o. male contacted today regarding refills of specialty medication(s) Dolutegravir -lamiVUDine  (Dovato )   Patient requested Delivery   Delivery date: 11/13/24   Verified address: [address]  [place]   Medication will be filled on: 11/12/24   They are aware of $5 copay, CC on file.

## 2024-11-01 NOTE — Telephone Encounter (Signed)
 [facility] Patient Advocate Encounter   I was successful in securing patient a $ 2100.00 grant from [employer] to provide copayment coverage for Dovato .  The patient's out of pocket cost will be $5.00 monthly.     I have spoken with the patient.    The billing information is as follows and has been shared with [facility].          Dates of Eligibility: 10/31/2024 through 10/17/25  Patient knows to call the office with questions or concerns.  Arland Hutchinson, CPhT Specialty Pharmacy Patient [facility] Phone: [phone] Fax:  [phone]

## 2024-11-06 ENCOUNTER — Other Ambulatory Visit (HOSPITAL_COMMUNITY): Payer: Self-pay

## 2024-11-07 ENCOUNTER — Other Ambulatory Visit: Payer: Self-pay

## 2024-11-07 NOTE — Progress Notes (Signed)
 Patient was contacted by the pharmacy regarding their  specialty medication(s) Dolutegravir -lamiVUDine  (Dovato ) to reschedule an earlier delivery date, due to impending cold weather conditions. Medication(s) will be filled 11/07/24 for a delivery by 11/08/24

## 2024-11-08 ENCOUNTER — Ambulatory Visit: Payer: MEDICARE | Admitting: Internal Medicine

## 2024-11-08 ENCOUNTER — Encounter: Payer: Self-pay | Admitting: Internal Medicine

## 2024-11-08 ENCOUNTER — Other Ambulatory Visit: Payer: Self-pay

## 2024-11-08 ENCOUNTER — Other Ambulatory Visit (HOSPITAL_COMMUNITY): Payer: Self-pay

## 2024-11-08 VITALS — BP 157/104 | HR 75 | Temp 97.7°F | Ht 66.0 in

## 2024-11-08 DIAGNOSIS — L97519 Non-pressure chronic ulcer of other part of right foot with unspecified severity: Secondary | ICD-10-CM

## 2024-11-08 DIAGNOSIS — N183 Chronic kidney disease, stage 3 unspecified: Secondary | ICD-10-CM

## 2024-11-08 DIAGNOSIS — B20 Human immunodeficiency virus [HIV] disease: Secondary | ICD-10-CM

## 2024-11-08 DIAGNOSIS — I129 Hypertensive chronic kidney disease with stage 1 through stage 4 chronic kidney disease, or unspecified chronic kidney disease: Secondary | ICD-10-CM

## 2024-11-08 DIAGNOSIS — Z79899 Other long term (current) drug therapy: Secondary | ICD-10-CM | POA: Diagnosis not present

## 2024-11-08 DIAGNOSIS — I10 Essential (primary) hypertension: Secondary | ICD-10-CM

## 2024-11-08 MED ORDER — AMOXICILLIN-POT CLAVULANATE 875-125 MG PO TABS
1.0000 | ORAL_TABLET | Freq: Two times a day (BID) | ORAL | 0 refills | Status: AC
  Filled 2024-11-08: qty 20, 10d supply, fill #0

## 2024-11-08 MED ORDER — DOXYCYCLINE HYCLATE 100 MG PO TABS
100.0000 mg | ORAL_TABLET | Freq: Two times a day (BID) | ORAL | 0 refills | Status: AC
  Filled 2024-11-08: qty 20, 10d supply, fill #0

## 2024-11-08 NOTE — Progress Notes (Signed)
 "      Patient ID: Jeremy Reeves, male   DOB: 1941/02/09, 84 y.o.   MRN: [id number]  HPI 84yo M with HIV disease, Cd 4 count of 435/VL <20, currently on dovato .  Takes 2-3 times per day of ensure.  Outpatient Encounter Medications as of 11/08/2024  Medication Sig   collagenase  (SANTYL ) 250 UNIT/GM ointment Apply 1 Application topically daily. Cover with gauze dressing   Cranberry 360 MG CAPS Take 1 capsule by mouth daily.   cyanocobalamin (VITAMIN B12) 1000 MCG tablet Take 1,000 mcg by mouth daily.   dolutegravir -lamiVUDine  (DOVATO ) 50-300 MG tablet Take 1 tablet by mouth daily.   feeding supplement (ENSURE PLUS HIGH PROTEIN) LIQD Take 237 mLs by mouth 3 (three) times daily between meals.   Iron-Vitamins (GERITOL TONIC PO) Take 1 Capful by mouth daily at 12 noon.   ketoconazole  (NIZORAL ) 2 % cream Apply 1 Application topically daily. Apply 1gm between affected toes once daily   omeprazole  (PRILOSEC) 20 MG capsule TAKE 1 CAPSULE EVERY DAY   vitamin E 400 UNIT capsule Take 400 Units by mouth daily.   [Paused] carvedilol  (COREG ) 6.25 MG tablet TAKE 1 TABLET TWICE DAILY WITH MEALS (Patient not taking: Reported on 09/18/2024)   Cinnamon 500 MG capsule Take 1,000 mg by mouth daily. (Patient not taking: Reported on 09/18/2024)   [Paused] ELIQUIS  2.5 MG TABS tablet TAKE 1 TABLET TWICE DAILY (Patient not taking: Reported on 09/18/2024)   mirabegron  ER (MYRBETRIQ ) 25 MG TB24 tablet Take 1 tablet (25 mg total) by mouth daily. (Patient not taking: Reported on 11/08/2024)   No facility-administered encounter medications on file as of 11/08/2024.     Patient Active Problem List   Diagnosis Date Noted   Chronic kidney disease, stage 3b (HCC) 06/14/2024   Dysphagia 06/14/2024   Failure to thrive in adult 06/12/2024   OAB (overactive bladder) 06/12/2023   History of shingles 04/14/2022   Stage 3a chronic kidney disease (HCC) 04/09/2021   Persistent atrial fibrillation (HCC) 08/02/2019   GERD  (gastroesophageal reflux disease) 07/05/2013   HIV disease (HCC) 05/24/2013   Hypertension associated with diabetes (HCC) 01/25/2013   ASHD (arteriosclerotic heart disease) 03/18/2011   BPH (benign prostatic hyperplasia) 03/18/2011   Hyperlipidemia associated with type 2 diabetes mellitus (HCC) 03/18/2011   Type 2 diabetes mellitus in remission 03/18/2011     Health Maintenance Due  Topic Date Due   Zoster Vaccines- Shingrix (1 of 2) 08/17/1961   FOOT EXAM  04/09/2022   OPHTHALMOLOGY EXAM  04/13/2023   Influenza Vaccine  05/18/2024   COVID-19 Vaccine (4 - 2025-26 season) 06/18/2024     Review of Systems  Physical Exam   BP (!) 157/104   Pulse 75   Temp 97.7 F (36.5 C) (Temporal)   Ht 5' 6 (1.676 m)   SpO2 98%   BMI 18.88 kg/m    Lab Results  Component Value Date   CD4TCELL 41 03/07/2024   Lab Results  Component Value Date   CD4TABS 437 03/17/2023   CD4TABS 513 02/22/2022   CD4TABS 459 03/23/2021   Lab Results  Component Value Date   HIV1RNAQUANT NOT DETECTED 03/07/2024   Lab Results  Component Value Date   HEPBSAB NON-REACTIVE 06/07/2024   Lab Results  Component Value Date   LABRPR NON-REACTIVE 03/17/2023    CBC Lab Results  Component Value Date   WBC 4.1 06/14/2024   RBC 3.72 (L) 06/14/2024   HGB 11.8 (L) 06/14/2024   HCT 34.2 (L)  06/14/2024   PLT 53 (L) 06/14/2024   MCV 91.9 06/14/2024   MCH 31.7 06/14/2024   MCHC 34.5 06/14/2024   RDW 20.2 (H) 06/14/2024   LYMPHSABS 1.4 06/11/2024   MONOABS 0.2 06/11/2024   EOSABS 0.1 06/11/2024    BMET Lab Results  Component Value Date   NA 140 06/14/2024   K 4.2 06/14/2024   CL 107 06/14/2024   CO2 26 06/14/2024   GLUCOSE 91 06/14/2024   BUN 12 06/14/2024   CREATININE 1.20 06/14/2024   CALCIUM  7.8 (L) 06/14/2024   GFRNONAA >60 06/14/2024   GFRAA 47 (L) 03/23/2021      Assessment and Plan  HIV disease = will check CD 4 count and HIV VL. Plan to continue on dovato   Long term medication  management/ckd 3 = will check cr  Right lateral foot ulcer size of dime -calculous. Ongoing through July. Hasn't seen podiatrist. Applying daily santyl . Tender, foul smelling, Needs debridement. Will do a course of amox/clav plus doxy. Will 10 day.   Sees [facility] in [place] - dr. malvin  Hypertension =Will check bp;    "

## 2024-11-09 LAB — T-HELPER CELL (CD4) - (RCID CLINIC ONLY)
CD4 % Helper T Cell: 36 % (ref 33–65)
CD4 T Cell Abs: 486 /uL (ref 400–1790)

## 2024-11-10 LAB — CBC WITH DIFFERENTIAL/PLATELET
Absolute Lymphocytes: 1395 {cells}/uL (ref 850–3900)
Absolute Monocytes: 650 {cells}/uL (ref 200–950)
Basophils Absolute: 30 {cells}/uL (ref 0–200)
Basophils Relative: 0.6 %
Eosinophils Absolute: 130 {cells}/uL (ref 15–500)
Eosinophils Relative: 2.6 %
HCT: 51.1 % (ref 39.4–51.1)
Hemoglobin: 16.5 g/dL (ref 13.2–17.1)
MCH: 27.7 pg (ref 27.0–33.0)
MCHC: 32.3 g/dL (ref 31.6–35.4)
MCV: 85.9 fL (ref 81.4–101.7)
MPV: 11.9 fL (ref 7.5–12.5)
Monocytes Relative: 13 %
Neutro Abs: 2795 {cells}/uL (ref 1500–7800)
Neutrophils Relative %: 55.9 %
Platelets: 146 10*3/uL (ref 140–400)
RBC: 5.95 Million/uL — ABNORMAL HIGH (ref 4.20–5.80)
RDW: 13.8 % (ref 11.0–15.0)
Total Lymphocyte: 27.9 %
WBC: 5 10*3/uL (ref 3.8–10.8)

## 2024-11-10 LAB — COMPLETE METABOLIC PANEL WITHOUT GFR
AG Ratio: 1.4 (calc) (ref 1.0–2.5)
ALT: 15 U/L (ref 9–46)
AST: 27 U/L (ref 10–35)
Albumin: 4.9 g/dL (ref 3.6–5.1)
Alkaline phosphatase (APISO): 52 U/L (ref 35–144)
BUN/Creatinine Ratio: 15 (calc) (ref 6–22)
BUN: 26 mg/dL — ABNORMAL HIGH (ref 7–25)
CO2: 26 mmol/L (ref 20–32)
Calcium: 10.2 mg/dL (ref 8.6–10.3)
Chloride: 101 mmol/L (ref 98–110)
Creat: 1.78 mg/dL — ABNORMAL HIGH (ref 0.70–1.22)
Globulin: 3.4 g/dL (ref 1.9–3.7)
Glucose, Bld: 81 mg/dL (ref 65–99)
Potassium: 4.9 mmol/L (ref 3.5–5.3)
Sodium: 140 mmol/L (ref 135–146)
Total Bilirubin: 0.8 mg/dL (ref 0.2–1.2)
Total Protein: 8.3 g/dL — ABNORMAL HIGH (ref 6.1–8.1)

## 2024-11-10 LAB — HIV-1 RNA QUANT-NO REFLEX-BLD
HIV 1 RNA Quant: NOT DETECTED {copies}/mL
HIV-1 RNA Quant, Log: NOT DETECTED {Log_copies}/mL

## 2024-11-10 LAB — C-REACTIVE PROTEIN: CRP: <3 mg/L

## 2024-11-10 LAB — SEDIMENTATION RATE: Sed Rate: 2 mm/h (ref 0–20)

## 2024-11-13 ENCOUNTER — Other Ambulatory Visit (HOSPITAL_COMMUNITY): Payer: Self-pay

## 2024-11-13 ENCOUNTER — Other Ambulatory Visit: Payer: Self-pay

## 2024-11-15 ENCOUNTER — Encounter: Payer: Self-pay | Admitting: Podiatry

## 2024-11-15 ENCOUNTER — Ambulatory Visit (INDEPENDENT_AMBULATORY_CARE_PROVIDER_SITE_OTHER): Payer: MEDICARE | Admitting: Podiatry

## 2024-11-15 ENCOUNTER — Ambulatory Visit (INDEPENDENT_AMBULATORY_CARE_PROVIDER_SITE_OTHER): Payer: MEDICARE

## 2024-11-15 DIAGNOSIS — I739 Peripheral vascular disease, unspecified: Secondary | ICD-10-CM | POA: Diagnosis not present

## 2024-11-15 DIAGNOSIS — E11621 Type 2 diabetes mellitus with foot ulcer: Secondary | ICD-10-CM

## 2024-11-15 DIAGNOSIS — L97512 Non-pressure chronic ulcer of other part of right foot with fat layer exposed: Secondary | ICD-10-CM

## 2024-11-15 NOTE — Progress Notes (Signed)
 "  Subjective:  Patient ID: Jeremy Reeves, male    DOB: February 06, 1941,  MRN: [id number]  Chief Complaint  Patient presents with   Foot Pain    Right foot ulcer lateral. Sore to touch.  Wound opened 7 weeks ago. Has been applying SANTYL ) 250 UNIT/GM ointment. Has not seemed to improve.  A1c 6.3 05/30/24.     Discussed the use of AI scribe software for clinical note transcription with the patient, who gave verbal consent to proceed.  History of Present Illness Jeremy Reeves is an 84 year old male with type 2 diabetes mellitus and peripheral artery disease who presents for evaluation of a chronic, painful ulcer on the lateral aspect of the right foot.  He has had a very painful ulcer on the lateral and posterior right foot over the fifth metatarsal base for at least one month before early December 2025. Pain radiates around the area and is worsened by shoe wear despite switching to larger shoes to accommodate dressings. He generally ambulates unassisted. He denies numbness and has intact light touch. He has no current leg swelling and wears compression socks.  He has used Santyl  since early December applied daily Prior wound care followed a hospitalization and has included border foam dressings, Band-Aids, and larger pads. He is completing courses of doxycycline  and amoxicillin  for the wound, seen by infectious disease last week. He stopped Eliquis  after hospital discharge. He has not had prior evaluation of arterial circulation  He saw infectious disease last week. Labs showed no leukocytosis or elevated CRP or ESR. His diabetes control is described as good, with the last A1c in October 2025 which was 6.3. Glycemic control worsened during a recent hospitalization but has improved with weight gain and better overall condition. He was previously on hospice for poor health but is no longer receiving hospice care.      Objective:    Physical Exam EXTREMITIES: Ulcer on right lateral fifth  metatarsal base measuring 0.5 cm x 0.5 cm x 0.3, with fibrotic slough, fibrogranular base.  Tender on palpation.  No surrounding erythema.  No significant drainage.  Mild musky odor non-palpable posterior tibial and dorsalis pedis pulses. Capillary refill 3-5 seconds in right foot digits. Pedal skin atrophic with 1+ to 2+ pitting edema. NEUROLOGICAL: Light touch sensation intact.        Results Labs CBC (11/08/2024): WBC 5.0 CRP:<3.0 Sed rate: 2   Radiology Right foot X-ray: No radiographic evidence of osteomyelitis.  No acute fractures.  No soft tissue emphysema.  Evidence of erosive changes to the first metatarsal head noted consistent with gout.  (Independently interpreted)  Wound cleansing and dressing of right foot ulcer Area cleansed, topical medication applied, covered with border foam dressing, lightly wrapped to secure.   Assessment:   1. Right foot ulcer, with fat layer exposed (HCC)   2. Type 2 diabetes mellitus with foot ulcer (CODE) (HCC)   3. PAD (peripheral artery disease)      Plan:  Patient was evaluated and treated and all questions answered.  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 No infection or bone involvement. Glycemic control good. Wound healing also complicated by HIV, A-fib, CKD stage III - Continued Santyl  ointment once or twice daily, covered with moistened saline gauze or appropriate dressing. -  No significant excisional debridement was performed today due to pain, did lightly debride overlying slough using microcurette and cleanse wound with wound cleanser. - Advised cleaning wound with antibacterial soap and water or wound cleanser between dressing changes. - Applied medicated  Salinocaine cream in clinic to facilitate cleaning and promote tissue growth. - Advised completion of doxycycline  and amoxicillin  courses, no prolonged antibiotics needed. - Demonstrated and recommended horseshoe-shaped callus pads to offload and protect ulcer. - Recommended Band-Aids or border foam dressings for coverage. - Provided instruction on padding techniques to minimize pressure and friction. - Fitted and dispensed surgical shoe to offload ulcer and accommodate dressings. - Applied light wrap and provided stocking to secure dressings. - Advised use of cane as needed for ambulation. - Scheduled follow-up in 3-4 weeks for wound reassessment, with plan to increase visit frequency if debridement becomes feasible. - Planned to address toenail care and reassess ulcer at next visit.  Peripheral artery disease Resolved lower extremity swelling. Right foot warm with intact sensation, but difficult pedal pulses and delayed capillary refill. Concern for underlying peripheral artery disease affecting ulcer healing. - Ordered noninvasive vascular study (ankle-brachial index and pulse volume recording) to assess lower extremity arterial circulation.  Has follow-up arranged on 12/10/2024.  Will continue with wound care and also diabetic footcare at that time.      No follow-ups on file.     "

## 2024-11-15 NOTE — Patient Instructions (Signed)
 Instructions for Wound Care  The most important step to healing a foot wound is to reduce the pressure on your foot - it is extremely important to stay off your foot as much as possible and wear the shoe/boot as instructed.  Cleanse your foot with saline wash or warm soapy water (dial antibacterial soap or similar).  Blot dry.  Apply prescribed medication to your wound and cover with gauze lightly moistened with saline and a bandage.  May hold bandage in place with Coban (self sticky wrap), Ace bandage or tape.  You may find dressing supplies at your local [employer], [employer], drug store or medical supply store.  Monitor for any signs/symptoms of infection. If there is any increase in redness, red streaks, increase in drainage, warmth to your foot please give us  a call. Also, if you start to run a fever or have flu-like symptoms that can also be a sign of infection. Call the office immediately if any occur or go directly to the emergency room.   If you have any questions, please feel free to give us  a call at [phone] or if you are on MyChart you can always send me a message if needed.    Recommend padding around the ulcer with felt callus or horsehoe pads which can be found on [employer].

## 2024-12-03 ENCOUNTER — Ambulatory Visit: Payer: MEDICARE | Admitting: Podiatry

## 2024-12-10 ENCOUNTER — Ambulatory Visit: Payer: MEDICARE | Admitting: Podiatry

## 2024-12-20 ENCOUNTER — Ambulatory Visit: Payer: Self-pay | Admitting: Internal Medicine
